# Patient Record
Sex: Male | Born: 1960 | Race: White | Hispanic: No | Smoking: Former smoker
Health system: Southern US, Community
[De-identification: ages and names within clinical notes are randomized; demographics above are authoritative.]

## PROBLEM LIST (undated history)

## (undated) DIAGNOSIS — E785 Hyperlipidemia, unspecified: Secondary | ICD-10-CM

## (undated) DIAGNOSIS — I10 Essential (primary) hypertension: Secondary | ICD-10-CM

## (undated) HISTORY — DX: Hyperlipidemia, unspecified: E78.5

## (undated) HISTORY — DX: Essential (primary) hypertension: I10

## (undated) HISTORY — PX: FRACTURE SURGERY: SHX138

---

## 2012-07-11 ENCOUNTER — Encounter: Payer: 59 | Admitting: Physician Assistant

## 2012-07-11 NOTE — Progress Notes (Signed)
This encounter was created in error - please disregard.

## 2014-10-12 ENCOUNTER — Ambulatory Visit (INDEPENDENT_AMBULATORY_CARE_PROVIDER_SITE_OTHER): Payer: 59 | Admitting: Family Medicine

## 2014-10-12 ENCOUNTER — Telehealth: Payer: Self-pay

## 2014-10-12 VITALS — BP 168/104 | HR 78 | Temp 97.4°F | Resp 16 | Ht 70.0 in | Wt 174.0 lb

## 2014-10-12 DIAGNOSIS — B370 Candidal stomatitis: Secondary | ICD-10-CM

## 2014-10-12 DIAGNOSIS — I10 Essential (primary) hypertension: Secondary | ICD-10-CM

## 2014-10-12 DIAGNOSIS — J029 Acute pharyngitis, unspecified: Secondary | ICD-10-CM

## 2014-10-12 DIAGNOSIS — E785 Hyperlipidemia, unspecified: Secondary | ICD-10-CM

## 2014-10-12 DIAGNOSIS — Z79899 Other long term (current) drug therapy: Secondary | ICD-10-CM

## 2014-10-12 DIAGNOSIS — R351 Nocturia: Secondary | ICD-10-CM

## 2014-10-12 LAB — POCT CBC
Granulocyte percent: 66.3 %G (ref 37–80)
HCT, POC: 48.3 % (ref 43.5–53.7)
Hemoglobin: 16.2 g/dL (ref 14.1–18.1)
Lymph, poc: 1.9 (ref 0.6–3.4)
MCH, POC: 30.4 pg (ref 27–31.2)
MCHC: 33.4 g/dL (ref 31.8–35.4)
MCV: 91.1 fL (ref 80–97)
MID (cbc): 0.6 (ref 0–0.9)
MPV: 8.8 fL (ref 0–99.8)
POC Granulocyte: 5 (ref 2–6.9)
POC LYMPH PERCENT: 25.6 %L (ref 10–50)
POC MID %: 8.1 %M (ref 0–12)
Platelet Count, POC: 197 10*3/uL (ref 142–424)
RBC: 5.31 M/uL (ref 4.69–6.13)
RDW, POC: 13.6 %
WBC: 7.6 10*3/uL (ref 4.6–10.2)

## 2014-10-12 LAB — COMPREHENSIVE METABOLIC PANEL
ALT: 37 U/L (ref 0–53)
AST: 24 U/L (ref 0–37)
Albumin: 4.6 g/dL (ref 3.5–5.2)
Alkaline Phosphatase: 68 U/L (ref 39–117)
BUN: 12 mg/dL (ref 6–23)
CO2: 29 mEq/L (ref 19–32)
Calcium: 9.2 mg/dL (ref 8.4–10.5)
Chloride: 105 mEq/L (ref 96–112)
Creat: 0.99 mg/dL (ref 0.50–1.35)
Glucose, Bld: 101 mg/dL — ABNORMAL HIGH (ref 70–99)
Potassium: 4.2 mEq/L (ref 3.5–5.3)
Sodium: 142 mEq/L (ref 135–145)
Total Bilirubin: 0.5 mg/dL (ref 0.2–1.2)
Total Protein: 7.3 g/dL (ref 6.0–8.3)

## 2014-10-12 LAB — LIPID PANEL
Cholesterol: 223 mg/dL — ABNORMAL HIGH (ref 0–200)
HDL: 45 mg/dL (ref >39)
LDL Cholesterol: 153 mg/dL — ABNORMAL HIGH (ref 0–99)
Total CHOL/HDL Ratio: 5 Ratio
Triglycerides: 123 mg/dL (ref <150)
VLDL: 25 mg/dL (ref 0–40)

## 2014-10-12 LAB — POCT GLYCOSYLATED HEMOGLOBIN (HGB A1C): Hemoglobin A1C: 5.1

## 2014-10-12 LAB — POCT RAPID STREP A (OFFICE): Rapid Strep A Screen: NEGATIVE

## 2014-10-12 LAB — GLUCOSE, POCT (MANUAL RESULT ENTRY): POC Glucose: 86 mg/dl (ref 70–99)

## 2014-10-12 MED ORDER — FLUTICASONE PROPIONATE 50 MCG/ACT NA SUSP: 2.0000 | Freq: Every day | NASAL | Status: DC

## 2014-10-12 MED ORDER — NYSTATIN 100000 UNIT/ML MT SUSP: 5.0000 mL | Freq: Four times a day (QID) | OROMUCOSAL | Status: DC

## 2014-10-12 MED ORDER — CETIRIZINE HCL 10 MG PO TABS: 10.0000 mg | ORAL_TABLET | Freq: Every day | ORAL | Status: DC

## 2014-10-12 MED ORDER — LISINOPRIL-HYDROCHLOROTHIAZIDE 10-12.5 MG PO TABS: 1.0000 | ORAL_TABLET | Freq: Every day | ORAL | Status: DC

## 2014-10-12 NOTE — Telephone Encounter (Signed)
Pt states nystatin could not be filled at pharmacy we sent it to because it is out of stock. Pt wants to know if this med can be sent to [facility] on [address] instead. Pt CB# [phone]

## 2014-10-12 NOTE — Telephone Encounter (Signed)
Med sent. Pt notified.  

## 2014-10-12 NOTE — Patient Instructions (Signed)
Thrush, Adult  Thrush, also called oral candidiasis, is a fungal infection that develops in the mouth and throat and on the tongue. It causes white patches to form on the mouth and tongue. Thrush is most common in older adults, but it can occur at any age.  Many cases of thrush are mild, but this infection can also be more serious. Thrush can be a recurring problem for people who have chronic illnesses or who take medicines that limit the body's ability to fight infection. Because these people have difficulty fighting infections, the fungus that causes thrush can spread throughout the body. This can cause life-threatening blood or organ infections. CAUSES  Thrush is usually caused by a yeast called Candida albicans. This fungus is normally present in small amounts in the mouth and on other mucous membranes. It usually causes no harm. However, when conditions are present that allow the fungus to grow uncontrolled, it invades surrounding tissues and becomes an infection. Less often, other Candida species can also lead to thrush.  RISK FACTORS Thrush is more likely to develop in the following people:  People with an impaired ability to fight infection (weakened immune system).   Older adults.   People with HIV.   People with diabetes.   People with dry mouth (xerostomia).   Pregnant women.   People with poor dental care, especially those who have false teeth.   People who use antibiotic medicines.  SIGNS AND SYMPTOMS  Thrush can be a mild infection that causes no symptoms. If symptoms develop, they may include:   A burning feeling in the mouth and throat. This can occur at the start of a thrush infection.   White patches that adhere to the mouth and tongue. The tissue around the patches may be red, raw, and painful. If rubbed (during tooth brushing, for example), the patches and the tissue of the mouth may bleed easily.   A bad taste in the mouth or difficulty tasting foods.    Cottony feeling in the mouth.   Pain during eating and swallowing. DIAGNOSIS  Your health care provider can usually diagnose thrush by looking in your mouth and asking you questions about your health.  TREATMENT  Medicines that help prevent the growth of fungi (antifungals) are the standard treatment for thrush. These medicines are either applied directly to the affected area (topical) or swallowed (oral). The treatment will depend on the severity of the condition.  Mild Thrush Mild cases of thrush may clear up with the use of an antifungal mouth rinse or lozenges. Treatment usually lasts about 14 days.  Moderate to Severe Thrush  More severe thrush infections that have spread to the esophagus are treated with an oral antifungal medicine. A topical antifungal medicine may also be used.   For some severe infections, a treatment period longer than 14 days may be needed.   Oral antifungal medicines are almost never used during pregnancy because the fetus may be harmed. However, if a pregnant woman has a rare, severe thrush infection that has spread to her blood, oral antifungal medicines may be used. In this case, the risk of harm to the mother and fetus from the severe thrush infection may be greater than the risk posed by the use of antifungal medicines.  Persistent or Recurrent Thrush For cases of thrush that do not go away or keep coming back, treatment may involve the following:   Treatment may be needed twice as long as the symptoms last.   Treatment will   include both oral and topical antifungal medicines.   People with weakened immune systems can take an antifungal medicine on a continuous basis to prevent thrush infections.  It is important to treat conditions that make you more likely to get thrush, such as diabetes or HIV.  HOME CARE INSTRUCTIONS   Only take over-the-counter or prescription medicine as directed by your health care provider. Talk to your health care  provider about an over-the-counter medicine called gentian violet, which kills bacteria and fungi.   Eat plain, unflavored yogurt as directed by your health care provider. Check the label to make sure the yogurt contains live cultures. This yogurt can help healthy bacteria grow in the mouth that can stop the growth of the fungus that causes thrush.   Try these measures to help reduce the discomfort of thrush:   Drink cold liquids such as water or iced tea.   Try flavored ice treats or frozen juices.   Eat foods that are easy to swallow, such as gelatin, ice cream, or custard.   If the patches in your mouth are painful, try drinking from a straw.   Rinse your mouth several times a day with a warm saltwater rinse. You can make the saltwater mixture with 1 tsp (6 g) of salt in 8 fl oz (0.2 L) of warm water.   If you wear dentures, remove the dentures before going to bed, brush them vigorously, and soak them in a cleaning solution as directed by your health care provider.   Women who are breastfeeding should clean their nipples with an antifungal medicine as directed by their health care provider. Dry the nipples after breastfeeding. Applying lanolin-containing body lotion may help relieve nipple soreness.  SEEK MEDICAL CARE IF:  Your symptoms are getting worse or are not improving within 7 days of starting treatment.   You have symptoms of spreading infection, such as white patches on the skin outside of the mouth.   You are nursing and you have redness, burning, or pain in the nipples that is not relieved with treatment.  MAKE SURE YOU:  Understand these instructions.  Will watch your condition.  Will get help right away if you are not doing well or get worse. Document Released: 06/05/2004 Document Revised: 07/01/2013 Document Reviewed: 04/13/2013 [facility] Patient Information 2015 [place], [place]. This information is not intended to replace advice given to you by your  health care provider. Make sure you discuss any questions you have with your health care provider. DASH Eating Plan DASH stands for "Dietary Approaches to Stop Hypertension." The DASH eating plan is a healthy eating plan that has been shown to reduce high blood pressure (hypertension). Additional health benefits may include reducing the risk of type 2 diabetes mellitus, heart disease, and stroke. The DASH eating plan may also help with weight loss. WHAT DO I NEED TO KNOW ABOUT THE DASH EATING PLAN? For the DASH eating plan, you will follow these general guidelines:  Choose foods with a percent daily value for sodium of less than 5% (as listed on the food label).  Use salt-free seasonings or herbs instead of table salt or sea salt.  Check with your health care provider or pharmacist before using salt substitutes.  Eat lower-sodium products, often labeled as "lower sodium" or "no salt added."  Eat fresh foods.  Eat more vegetables, fruits, and low-fat dairy products.  Choose whole grains. Look for the word "whole" as the first word in the ingredient list.  Choose fish and  skinless chicken or [place] more often than red meat. Limit fish, poultry, and meat to 6 oz (170 g) each day.  Limit sweets, desserts, sugars, and sugary drinks.  Choose heart-healthy fats.  Limit cheese to 1 oz (28 g) per day.  Eat more home-cooked food and less restaurant, buffet, and fast food.  Limit fried foods.  Cook foods using methods other than frying.  Limit canned vegetables. If you do use them, rinse them well to decrease the sodium.  When eating at a restaurant, ask that your food be prepared with less salt, or no salt if possible. WHAT FOODS CAN I EAT? Seek help from a dietitian for individual calorie needs. Grains Whole grain or whole wheat bread. Brown rice. Whole grain or whole wheat pasta. Quinoa, bulgur, and whole grain cereals. Low-sodium cereals. Corn or whole wheat flour tortillas. Whole  grain cornbread. Whole grain crackers. Low-sodium crackers. Vegetables Fresh or frozen vegetables (raw, steamed, roasted, or grilled). Low-sodium or reduced-sodium tomato and vegetable juices. Low-sodium or reduced-sodium tomato sauce and paste. Low-sodium or reduced-sodium canned vegetables.  Fruits All fresh, canned (in natural juice), or frozen fruits. Meat and Other Protein Products Ground beef (85% or leaner), grass-fed beef, or beef trimmed of fat. Skinless chicken or [place]. Ground chicken or [place]. Pork trimmed of fat. All fish and seafood. Eggs. Dried beans, peas, or lentils. Unsalted nuts and seeds. Unsalted canned beans. Dairy Low-fat dairy products, such as skim or 1% milk, 2% or reduced-fat cheeses, low-fat ricotta or cottage cheese, or plain low-fat yogurt. Low-sodium or reduced-sodium cheeses. Fats and Oils Tub margarines without trans fats. Light or reduced-fat mayonnaise and salad dressings (reduced sodium). Avocado. Safflower, olive, or canola oils. Natural peanut or almond butter. Other Unsalted popcorn and pretzels. The items listed above may not be a complete list of recommended foods or beverages. Contact your dietitian for more options. WHAT FOODS ARE NOT RECOMMENDED? Grains White bread. White pasta. White rice. Refined cornbread. Bagels and croissants. Crackers that contain trans fat. Vegetables Creamed or fried vegetables. Vegetables in a cheese sauce. Regular canned vegetables. Regular canned tomato sauce and paste. Regular tomato and vegetable juices. Fruits Dried fruits. Canned fruit in light or heavy syrup. Fruit juice. Meat and Other Protein Products Fatty cuts of meat. Ribs, chicken wings, bacon, sausage, bologna, salami, chitterlings, fatback, hot dogs, bratwurst, and packaged luncheon meats. Salted nuts and seeds. Canned beans with salt. Dairy Whole or 2% milk, cream, half-and-half, and cream cheese. Whole-fat or sweetened yogurt. Full-fat cheeses or blue  cheese. Nondairy creamers and whipped toppings. Processed cheese, cheese spreads, or cheese curds. Condiments Onion and garlic salt, seasoned salt, table salt, and sea salt. Canned and packaged gravies. Worcestershire sauce. Tartar sauce. Barbecue sauce. Teriyaki sauce. Soy sauce, including reduced sodium. Steak sauce. Fish sauce. Oyster sauce. Cocktail sauce. Horseradish. Ketchup and mustard. Meat flavorings and tenderizers. Bouillon cubes. Hot sauce. Tabasco sauce. Marinades. Taco seasonings. Relishes. Fats and Oils Butter, stick margarine, lard, shortening, ghee, and bacon fat. Coconut, palm kernel, or palm oils. Regular salad dressings. Other Pickles and olives. Salted popcorn and pretzels. The items listed above may not be a complete list of foods and beverages to avoid. Contact your dietitian for more information. WHERE CAN I FIND MORE INFORMATION? National [employer], [employer], [facility] [employer] Institute: [email or web address] Document Released: 08/30/2011 Document Revised: 01/25/2014 Document Reviewed: 07/15/2013 [facility] Patient Information 2015 [place], [place]. This information is not intended to replace advice given to you by your health care provider. Make sure you discuss any  questions you have with your health care provider. Managing Your High Blood Pressure Blood pressure is a measurement of how forceful your blood is pressing against the walls of the arteries. Arteries are muscular tubes within the circulatory system. Blood pressure does not stay the same. Blood pressure rises when you are active, excited, or nervous; and it lowers during sleep and relaxation. If the numbers measuring your blood pressure stay above normal most of the time, you are at risk for health problems. High blood pressure (hypertension) is a long-term (chronic) condition in which blood pressure is elevated. A blood pressure reading is recorded as two numbers, such as 120 over 80 (or 120/80). The  first, higher number is called the systolic pressure. It is a measure of the pressure in your arteries as the heart beats. The second, lower number is called the diastolic pressure. It is a measure of the pressure in your arteries as the heart relaxes between beats.  Keeping your blood pressure in a normal range is important to your overall health and prevention of health problems, such as heart disease and stroke. When your blood pressure is uncontrolled, your heart has to work harder than normal. High blood pressure is a very common condition in adults because blood pressure tends to rise with age. Men and women are equally likely to have hypertension but at different times in life. Before age 80, men are more likely to have hypertension. After 55 years of age, women are more likely to have it. Hypertension is especially common in African Americans. This condition often has no signs or symptoms. The cause of the condition is usually not known. Your caregiver can help you come up with a plan to keep your blood pressure in a normal, healthy range. BLOOD PRESSURE STAGES Blood pressure is classified into four stages: normal, prehypertension, stage 1, and stage 2. Your blood pressure reading will be used to determine what type of treatment, if any, is necessary. Appropriate treatment options are tied to these four stages:  Normal  Systolic pressure (mm Hg): below 120.  Diastolic pressure (mm Hg): below 80. Prehypertension  Systolic pressure (mm Hg): 120 to 139.  Diastolic pressure (mm Hg): 80 to 89. Stage1  Systolic pressure (mm Hg): 140 to 159.  Diastolic pressure (mm Hg): 90 to 99. Stage2  Systolic pressure (mm Hg): 160 or above.  Diastolic pressure (mm Hg): 100 or above. RISKS RELATED TO HIGH BLOOD PRESSURE Managing your blood pressure is an important responsibility. Uncontrolled high blood pressure can lead to:  A heart attack.  A stroke.  A weakened blood vessel  (aneurysm).  Heart failure.  Kidney damage.  Eye damage.  Metabolic syndrome.  Memory and concentration problems. HOW TO MANAGE YOUR BLOOD PRESSURE Blood pressure can be managed effectively with lifestyle changes and medicines (if needed). Your caregiver will help you come up with a plan to bring your blood pressure within a normal range. Your plan should include the following: Education  Read all information provided by your caregivers about how to control blood pressure.  Educate yourself on the latest guidelines and treatment recommendations. New research is always being done to further define the risks and treatments for high blood pressure. Lifestylechanges  Control your weight.  Avoid smoking.  Stay physically active.  Reduce the amount of salt in your diet.  Reduce stress.  Control any chronic conditions, such as high cholesterol or diabetes.  Reduce your alcohol intake. Medicines  Several medicines (antihypertensive medicines) are available, if  needed, to bring blood pressure within a normal range. Communication  Review all the medicines you take with your caregiver because there may be side effects or interactions.  Talk with your caregiver about your diet, exercise habits, and other lifestyle factors that may be contributing to high blood pressure.  See your caregiver regularly. Your caregiver can help you create and adjust your plan for managing high blood pressure. RECOMMENDATIONS FOR TREATMENT AND FOLLOW-UP  The following recommendations are based on current guidelines for managing high blood pressure in nonpregnant adults. Use these recommendations to identify the proper follow-up period or treatment option based on your blood pressure reading. You can discuss these options with your caregiver.  Systolic pressure of 120 to 139 or diastolic pressure of 80 to 89: Follow up with your caregiver as directed.  Systolic pressure of 140 to 160 or diastolic  pressure of 90 to 100: Follow up with your caregiver within 2 months.  Systolic pressure above 160 or diastolic pressure above 100: Follow up with your caregiver within 1 month.  Systolic pressure above 180 or diastolic pressure above 110: Consider antihypertensive therapy; follow up with your caregiver within 1 week.  Systolic pressure above 200 or diastolic pressure above 120: Begin antihypertensive therapy; follow up with your caregiver within 1 week. Document Released: 06/04/2012 Document Reviewed: 06/04/2012 [facility] Patient Information 2015 [place]. This information is not intended to replace advice given to you by your health care provider. Make sure you discuss any questions you have with your health care provider. Hypertension Hypertension, commonly called high blood pressure, is when the force of blood pumping through your arteries is too strong. Your arteries are the blood vessels that carry blood from your heart throughout your body. A blood pressure reading consists of a higher number over a lower number, such as 110/72. The higher number (systolic) is the pressure inside your arteries when your heart pumps. The lower number (diastolic) is the pressure inside your arteries when your heart relaxes. Ideally you want your blood pressure below 120/80. Hypertension forces your heart to work harder to pump blood. Your arteries may become narrow or stiff. Having hypertension puts you at risk for heart disease, stroke, and other problems.  RISK FACTORS Some risk factors for high blood pressure are controllable. Others are not.  Risk factors you cannot control include:   Race. You may be at higher risk if you are African American.  Age. Risk increases with age.  Gender. Men are at higher risk than women before age 55 years. After age 265, women are at higher risk than men. Risk factors you can control include:  Not getting enough exercise or physical activity.  Being  overweight.  Getting too much fat, sugar, calories, or salt in your diet.  Drinking too much alcohol. SIGNS AND SYMPTOMS Hypertension does not usually cause signs or symptoms. Extremely high blood pressure (hypertensive crisis) may cause headache, anxiety, shortness of breath, and nosebleed. DIAGNOSIS  To check if you have hypertension, your health care provider will measure your blood pressure while you are seated, with your arm held at the level of your heart. It should be measured at least twice using the same arm. Certain conditions can cause a difference in blood pressure between your right and left arms. A blood pressure reading that is higher than normal on one occasion does not mean that you need treatment. If one blood pressure reading is high, ask your health care provider about having it checked again. TREATMENT  Treating high blood pressure includes making lifestyle changes and possibly taking medicine. Living a healthy lifestyle can help lower high blood pressure. You may need to change some of your habits. Lifestyle changes may include:  Following the DASH diet. This diet is high in fruits, vegetables, and whole grains. It is low in salt, red meat, and added sugars.  Getting at least 2 hours of brisk physical activity every week.  Losing weight if necessary.  Not smoking.  Limiting alcoholic beverages.  Learning ways to reduce stress. If lifestyle changes are not enough to get your blood pressure under control, your health care provider may prescribe medicine. You may need to take more than one. Work closely with your health care provider to understand the risks and benefits. HOME CARE INSTRUCTIONS  Have your blood pressure rechecked as directed by your health care provider.   Take medicines only as directed by your health care provider. Follow the directions carefully. Blood pressure medicines must be taken as prescribed. The medicine does not work as well when you skip  doses. Skipping doses also puts you at risk for problems.   Do not smoke.   Monitor your blood pressure at home as directed by your health care provider. SEEK MEDICAL CARE IF:   You think you are having a reaction to medicines taken.  You have recurrent headaches or feel dizzy.  You have swelling in your ankles.  You have trouble with your vision. SEEK IMMEDIATE MEDICAL CARE IF:  You develop a severe headache or confusion.  You have unusual weakness, numbness, or feel faint.  You have severe chest or abdominal pain.  You vomit repeatedly.  You have trouble breathing. MAKE SURE YOU:   Understand these instructions.  Will watch your condition.  Will get help right away if you are not doing well or get worse. Document Released: 09/10/2005 Document Revised: 01/25/2014 Document Reviewed: 07/03/2013 [facility] Patient Information 2015 [place], [place]. This information is not intended to replace advice given to you by your health care provider. Make sure you discuss any questions you have with your health care provider.

## 2014-10-12 NOTE — Progress Notes (Addendum)
Subjective:    Patient ID: Scott Pruitt, male    DOB: 10-03-1960, 54 y.o.   MRN: [id number] This chart was scribed for Norberto Sorenson, MD by Littie Deeds, Medical Scribe. This patient was seen in Room 1 and the patient's care was started at 9:37 AM.   Chief Complaint  Patient presents with  . Sore Throat    x 2 weeks   . Sinusitis    HPI HPI Comments: Scott Pruitt is a 54 y.o. male who presents to the [facility] complaining of gradual onset, intermittent sore throat that started 2 weeks. Patient also reports having postnasal drip. He has a deviated septum to the left since he was 54 years old which was never fixed. He denies burning and ulcers in his mouth, weight changes, urinary symptoms, fever, chills, and diaphoresis. Patient also denies using mouthwashes or inhalers, smoking, athlete's foot, hx of mono in the past. His last STD testing was years ago, but patient states he is not currently sexually active. He states he has been eating, drinking, and sleeping fine, and he states he has been tasting normally. Patient does say he gets cold sores sometimes. He has a PSHx of tonsillectomy. Patient has not had a physical done in a few years.  Patient does note that he had nocturia a few times a night a few months ago. He states he has been taking a prostate supplement with relief to his nocturia.  History reviewed. No pertinent past medical history. No current outpatient prescriptions on file prior to visit.   No current facility-administered medications on file prior to visit.   No Known Allergies   Review of Systems  Constitutional: Negative for fever, chills, diaphoresis, appetite change and unexpected weight change.  HENT: Positive for postnasal drip and sore throat.   Genitourinary: Negative for dysuria, hematuria, enuresis and difficulty urinating.       Objective:  BP 168/104 mmHg  Pulse 78  Temp(Src) 97.4 F (36.3 C) (Oral)  Resp 16  Ht  (1.778 m)   Wt 174 lb (78.926 kg)  BMI 24.97 kg/m2  SpO2 99%  Physical Exam  Constitutional: He is oriented to person, place, and time. He appears well-developed and well-nourished. No distress.  HENT:  Head: Normocephalic and atraumatic.  Right Ear: Tympanic membrane normal.  Left Ear: Tympanic membrane normal.  Nose: Mucosal edema, rhinorrhea and septal deviation present.  Mouth/Throat: Oropharynx is clear and moist. No oropharyngeal exudate.  Septum deviated to the left. Throat erythema extending up over the soft palate with small pinpoint vesicle midline. Significant amount of postnasal drip.  Eyes: Pupils are equal, round, and reactive to light.  Neck: Neck supple. No thyromegaly present.  Anterior cervical adenopathy. Thyroid normal.  Cardiovascular: Normal rate, regular rhythm, S1 normal, S2 normal and normal heart sounds.   No murmur heard. Pulmonary/Chest: Effort normal and breath sounds normal. No respiratory distress. He has no wheezes.  CTAB.  Musculoskeletal: He exhibits no edema.  Lymphadenopathy:       Head (right side): No submandibular adenopathy present.       Head (left side): No submandibular adenopathy present.    He has cervical adenopathy.       Right: No supraclavicular adenopathy present.       Left: No supraclavicular adenopathy present.  Neurological: He is alert and oriented to person, place, and time. No cranial nerve deficit.  Skin: Skin is warm and dry. No rash noted.  Psychiatric: He has a  normal mood and affect. His behavior is normal.  Vitals reviewed.         Assessment & Plan:   Sore throat - Plan: POCT rapid strep A, POCT CBC, Culture, Group A Strep - suspect due to thrush so start nystatin  Essential hypertension, benign - Plan: Lipid panel - BP quite elevated today so will start lisinopril-hctz 10-12.5 since pt reports he has had similarly elevated BP outside office - rec pt purchasing home BP cuff.  Encounter for long-term (current) use of  medications - Plan: Lipid panel, Comprehensive metabolic panel  Thrush, oral - Plan: POCT glucose (manual entry), POCT glycosylated hemoglobin (Hb A1C) - unsure while pt has developed this as he denies any other fungal infections, is not immunosuppressed that we are aware of - not on any meds - start nystatin swish and swallow.  Rec pt make CPE asap to complete cancer screening.  Nocturia - Plan: POCT glucose (manual entry), POCT glycosylated hemoglobin (Hb A1C)  HPL - flp today give pt ASCVD risk of 10.1% with ideal risk for 3.2% so start statin.  Meds ordered this encounter  Medications  . DISCONTD: nystatin (MYCOSTATIN) 100000 UNIT/ML suspension    Sig: Take 5 mLs (500,000 Units total) by mouth 4 (four) times daily.    Dispense:  300 mL    Refill:  0  . fluticasone (FLONASE) 50 MCG/ACT nasal spray    Sig: Place 2 sprays into both nostrils at bedtime.    Dispense:  16 g    Refill:  2  . cetirizine (ZYRTEC) 10 MG tablet    Sig: Take 1 tablet (10 mg total) by mouth at bedtime.    Dispense:  30 tablet    Refill:  2  . lisinopril-hydrochlorothiazide (PRINZIDE,ZESTORETIC) 10-12.5 MG per tablet    Sig: Take 1 tablet by mouth daily.    Dispense:  30 tablet    Refill:  1  . DISCONTD: nystatin (MYCOSTATIN) 100000 UNIT/ML suspension    Sig: Take 5 mLs (500,000 Units total) by mouth 4 (four) times daily.    Dispense:  300 mL    Refill:  0    I personally performed the services described in this documentation, which was scribed in my presence. The recorded information has been reviewed and considered, and addended by me as needed.  Norberto SorensonEva Tacori Kvamme, MD MPH  Results for orders placed or performed in visit on 10/12/14  Lipid panel  Result Value Ref Range   Cholesterol 223 (H) 0 - 200 mg/dL   Triglycerides 629123 <528<150 mg/dL   HDL 45 >41>39 mg/dL   Total CHOL/HDL Ratio 5.0 Ratio   VLDL 25 0 - 40 mg/dL   LDL Cholesterol 324153 (H) 0 - 99 mg/dL  Comprehensive metabolic panel  Result Value Ref Range    Sodium 142 135 - 145 mEq/L   Potassium 4.2 3.5 - 5.3 mEq/L   Chloride 105 96 - 112 mEq/L   CO2 29 19 - 32 mEq/L   Glucose, Bld 101 (H) 70 - 99 mg/dL   BUN 12 6 - 23 mg/dL   Creat 4.010.99 0.270.50 - 2.531.35 mg/dL   Total Bilirubin 0.5 0.2 - 1.2 mg/dL   Alkaline Phosphatase 68 39 - 117 U/L   AST 24 0 - 37 U/L   ALT 37 0 - 53 U/L   Total Protein 7.3 6.0 - 8.3 g/dL   Albumin 4.6 3.5 - 5.2 g/dL   Calcium 9.2 8.4 - 66.410.5 mg/dL  POCT rapid strep A  Result Value Ref Range   Rapid Strep A Screen Negative Negative  POCT CBC  Result Value Ref Range   WBC 7.6 4.6 - 10.2 K/uL   Lymph, poc 1.9 0.6 - 3.4   POC LYMPH PERCENT 25.6 10 - 50 %L   MID (cbc) 0.6 0 - 0.9   POC MID % 8.1 0 - 12 %M   POC Granulocyte 5.0 2 - 6.9   Granulocyte percent 66.3 37 - 80 %G   RBC 5.31 4.69 - 6.13 M/uL   Hemoglobin 16.2 14.1 - 18.1 g/dL   HCT, POC 11.9 14.7 - 53.7 %   MCV 91.1 80 - 97 fL   MCH, POC 30.4 27 - 31.2 pg   MCHC 33.4 31.8 - 35.4 g/dL   RDW, POC 82.9 %   Platelet Count, POC 197 142 - 424 K/uL   MPV 8.8 0 - 99.8 fL  POCT glucose (manual entry)  Result Value Ref Range   POC Glucose 86 70 - 99 mg/dl  POCT glycosylated hemoglobin (Hb A1C)  Result Value Ref Range   Hemoglobin A1C 5.1

## 2014-10-13 ENCOUNTER — Encounter: Payer: Self-pay | Admitting: Family Medicine

## 2014-10-13 MED ORDER — PRAVASTATIN SODIUM 40 MG PO TABS: 40.0000 mg | ORAL_TABLET | Freq: Every day | ORAL | Status: DC

## 2014-10-14 ENCOUNTER — Telehealth: Payer: Self-pay

## 2014-10-14 LAB — CULTURE, GROUP A STREP: Organism ID, Bacteria: NORMAL

## 2014-10-14 NOTE — Telephone Encounter (Signed)
1) Patient is returning our call for lab results 2) states he has had a problem getting a prescribed mouthwash filled and wants to discuss that concern and now the necessity of the rx

## 2014-10-17 NOTE — Telephone Encounter (Signed)
LM for rtn call- normal/negative lab results.

## 2014-10-22 ENCOUNTER — Other Ambulatory Visit: Payer: Self-pay

## 2014-10-22 MED ORDER — NYSTATIN 100000 UNIT/ML MT SUSP: 5.0000 mL | Freq: Four times a day (QID) | OROMUCOSAL | Status: DC

## 2014-10-22 NOTE — Telephone Encounter (Signed)
There was a transmission error when sent before. Resending now.

## 2014-10-25 NOTE — Progress Notes (Signed)
Appointment scheduled for 12/03/14 @ 2:15 patient notified.

## 2014-11-12 ENCOUNTER — Ambulatory Visit: Payer: 59 | Admitting: Family Medicine

## 2014-12-03 ENCOUNTER — Ambulatory Visit (INDEPENDENT_AMBULATORY_CARE_PROVIDER_SITE_OTHER): Payer: 59 | Admitting: Family Medicine

## 2014-12-03 ENCOUNTER — Encounter: Payer: Self-pay | Admitting: Family Medicine

## 2014-12-03 VITALS — BP 136/86 | HR 80 | Temp 97.9°F | Resp 16 | Ht 70.0 in | Wt 164.0 lb

## 2014-12-03 DIAGNOSIS — I10 Essential (primary) hypertension: Secondary | ICD-10-CM | POA: Diagnosis not present

## 2014-12-03 DIAGNOSIS — Z0000 Encounter for general adult medical examination without abnormal findings: Secondary | ICD-10-CM | POA: Diagnosis not present

## 2014-12-03 DIAGNOSIS — Z79899 Other long term (current) drug therapy: Secondary | ICD-10-CM | POA: Diagnosis not present

## 2014-12-03 DIAGNOSIS — E785 Hyperlipidemia, unspecified: Secondary | ICD-10-CM

## 2014-12-03 DIAGNOSIS — Z125 Encounter for screening for malignant neoplasm of prostate: Secondary | ICD-10-CM

## 2014-12-03 DIAGNOSIS — Z23 Encounter for immunization: Secondary | ICD-10-CM | POA: Diagnosis not present

## 2014-12-03 LAB — POCT UA - MICROSCOPIC ONLY
Bacteria, U Microscopic: NEGATIVE
Casts, Ur, LPF, POC: NEGATIVE
Crystals, Ur, HPF, POC: NEGATIVE
Mucus, UA: NEGATIVE
RBC, urine, microscopic: NEGATIVE
WBC, Ur, HPF, POC: NEGATIVE
Yeast, UA: NEGATIVE

## 2014-12-03 LAB — POCT URINALYSIS DIPSTICK
Bilirubin, UA: NEGATIVE
Blood, UA: NEGATIVE
Glucose, UA: NEGATIVE
Ketones, UA: NEGATIVE
Leukocytes, UA: NEGATIVE
Nitrite, UA: NEGATIVE
Protein, UA: NEGATIVE
Spec Grav, UA: 1.015
Urobilinogen, UA: 0.2
pH, UA: 5

## 2014-12-03 LAB — COMPREHENSIVE METABOLIC PANEL
ALT: 27 U/L (ref 0–53)
AST: 21 U/L (ref 0–37)
Albumin: 4.6 g/dL (ref 3.5–5.2)
Alkaline Phosphatase: 58 U/L (ref 39–117)
BUN: 15 mg/dL (ref 6–23)
CO2: 24 mEq/L (ref 19–32)
Calcium: 9.5 mg/dL (ref 8.4–10.5)
Chloride: 102 mEq/L (ref 96–112)
Creat: 0.92 mg/dL (ref 0.50–1.35)
Glucose, Bld: 87 mg/dL (ref 70–99)
Potassium: 3.9 mEq/L (ref 3.5–5.3)
Sodium: 140 mEq/L (ref 135–145)
Total Bilirubin: 0.5 mg/dL (ref 0.2–1.2)
Total Protein: 7.3 g/dL (ref 6.0–8.3)

## 2014-12-03 NOTE — Patient Instructions (Signed)
Fat and Cholesterol Control Diet Fat and cholesterol levels in your blood and organs are influenced by your diet. High levels of fat and cholesterol may lead to diseases of the heart, small and large blood vessels, gallbladder, liver, and pancreas. CONTROLLING FAT AND CHOLESTEROL WITH DIET Although exercise and lifestyle factors are important, your diet is key. That is because certain foods are known to raise cholesterol and others to lower it. The goal is to balance foods for their effect on cholesterol and more importantly, to replace saturated and trans fat with other types of fat, such as monounsaturated fat, polyunsaturated fat, and omega-3 fatty acids. On average, a person should consume no more than 15 to 17 g of saturated fat daily. Saturated and trans fats are considered "bad" fats, and they will raise LDL cholesterol. Saturated fats are primarily found in animal products such as meats, butter, and cream. However, that does not mean you need to give up all your favorite foods. Today, there are good tasting, low-fat, low-cholesterol substitutes for most of the things you like to eat. Choose low-fat or nonfat alternatives. Choose round or loin cuts of red meat. These types of cuts are lowest in fat and cholesterol. Chicken (without the skin), fish, veal, and ground turkey breast are great choices. Eliminate fatty meats, such as hot dogs and salami. Even shellfish have little or no saturated fat. Have a 3 oz (85 g) portion when you eat lean meat, poultry, or fish. Trans fats are also called "partially hydrogenated oils." They are oils that have been scientifically manipulated so that they are solid at room temperature resulting in a longer shelf life and improved taste and texture of foods in which they are added. Trans fats are found in stick margarine, some tub margarines, cookies, crackers, and baked goods.  When baking and cooking, oils are a great substitute for butter. The monounsaturated oils are  especially beneficial since it is believed they lower LDL and raise HDL. The oils you should avoid entirely are saturated tropical oils, such as coconut and palm.  Remember to eat a lot from food groups that are naturally free of saturated and trans fat, including fish, fruit, vegetables, beans, grains (barley, rice, couscous, bulgur wheat), and pasta (without cream sauces).  IDENTIFYING FOODS THAT LOWER FAT AND CHOLESTEROL  Soluble fiber may lower your cholesterol. This type of fiber is found in fruits such as apples, vegetables such as broccoli, potatoes, and carrots, legumes such as beans, peas, and lentils, and grains such as barley. Foods fortified with plant sterols (phytosterol) may also lower cholesterol. You should eat at least 2 g per day of these foods for a cholesterol lowering effect.  Read package labels to identify low-saturated fats, trans fat free, and low-fat foods at the supermarket. Select cheeses that have only 2 to 3 g saturated fat per ounce. Use a heart-healthy tub margarine that is free of trans fats or partially hydrogenated oil. When buying baked goods (cookies, crackers), avoid partially hydrogenated oils. Breads and muffins should be made from whole grains (whole-wheat or whole oat flour, instead of "flour" or "enriched flour"). Buy non-creamy canned soups with reduced salt and no added fats.  FOOD PREPARATION TECHNIQUES  Never deep-fry. If you must fry, either stir-fry, which uses very little fat, or use non-stick cooking sprays. When possible, broil, bake, or roast meats, and steam vegetables. Instead of putting butter or margarine on vegetables, use lemon and herbs, applesauce, and cinnamon (for squash and sweet potatoes). Use nonfat   yogurt, salsa, and low-fat dressings for salads.  LOW-SATURATED FAT / LOW-FAT FOOD SUBSTITUTES Meats / Saturated Fat (g)  Avoid: Steak, marbled (3 oz/85 g) / 11 g  Choose: Steak, lean (3 oz/85 g) / 4 g  Avoid: Hamburger (3 oz/85 g) / 7  g  Choose: Hamburger, lean (3 oz/85 g) / 5 g  Avoid: Ham (3 oz/85 g) / 6 g  Choose: Ham, lean cut (3 oz/85 g) / 2.4 g  Avoid: Chicken, with skin, dark meat (3 oz/85 g) / 4 g  Choose: Chicken, skin removed, dark meat (3 oz/85 g) / 2 g  Avoid: Chicken, with skin, light meat (3 oz/85 g) / 2.5 g  Choose: Chicken, skin removed, light meat (3 oz/85 g) / 1 g Dairy / Saturated Fat (g)  Avoid: Whole milk (1 cup) / 5 g  Choose: Low-fat milk, 2% (1 cup) / 3 g  Choose: Low-fat milk, 1% (1 cup) / 1.5 g  Choose: Skim milk (1 cup) / 0.3 g  Avoid: Hard cheese (1 oz/28 g) / 6 g  Choose: Skim milk cheese (1 oz/28 g) / 2 to 3 g  Avoid: Cottage cheese, 4% fat (1 cup) / 6.5 g  Choose: Low-fat cottage cheese, 1% fat (1 cup) / 1.5 g  Avoid: Ice cream (1 cup) / 9 g  Choose: Sherbet (1 cup) / 2.5 g  Choose: Nonfat frozen yogurt (1 cup) / 0.3 g  Choose: Frozen fruit bar / trace  Avoid: Whipped cream (1 tbs) / 3.5 g  Choose: Nondairy whipped topping (1 tbs) / 1 g Condiments / Saturated Fat (g)  Avoid: Mayonnaise (1 tbs) / 2 g  Choose: Low-fat mayonnaise (1 tbs) / 1 g  Avoid: Butter (1 tbs) / 7 g  Choose: Extra light margarine (1 tbs) / 1 g  Avoid: Coconut oil (1 tbs) / 11.8 g  Choose: Olive oil (1 tbs) / 1.8 g  Choose: Corn oil (1 tbs) / 1.7 g  Choose: Safflower oil (1 tbs) / 1.2 g  Choose: Sunflower oil (1 tbs) / 1.4 g  Choose: Soybean oil (1 tbs) / 2.4 g  Choose: Canola oil (1 tbs) / 1 g Document Released: 09/10/2005 Document Revised: 01/05/2013 Document Reviewed: 12/09/2013 [employer] Patient Information 2015 [employer]. This information is not intended to replace advice given to you by your health care provider. Make sure you discuss any questions you have with your health care provider. DASH Eating Plan DASH stands for "Dietary Approaches to Stop Hypertension." The DASH eating plan is a healthy eating plan that has been shown to reduce high blood pressure  (hypertension). Additional health benefits may include reducing the risk of type 2 diabetes mellitus, heart disease, and stroke. The DASH eating plan may also help with weight loss. WHAT DO I NEED TO KNOW ABOUT THE DASH EATING PLAN? For the DASH eating plan, you will follow these general guidelines:  Choose foods with a percent daily value for sodium of less than 5% (as listed on the food label).  Use salt-free seasonings or herbs instead of table salt or sea salt.  Check with your health care provider or pharmacist before using salt substitutes.  Eat lower-sodium products, often labeled as "lower sodium" or "no salt added."  Eat fresh foods.  Eat more vegetables, fruits, and low-fat dairy products.  Choose whole grains. Look for the word "whole" as the first word in the ingredient list.  Choose fish and skinless chicken or turkey more often than   red meat. Limit fish, poultry, and meat to 6 oz (170 g) each day.  Limit sweets, desserts, sugars, and sugary drinks.  Choose heart-healthy fats.  Limit cheese to 1 oz (28 g) per day.  Eat more home-cooked food and less restaurant, buffet, and fast food.  Limit fried foods.  Cook foods using methods other than frying.  Limit canned vegetables. If you do use them, rinse them well to decrease the sodium.  When eating at a restaurant, ask that your food be prepared with less salt, or no salt if possible. WHAT FOODS CAN I EAT? Seek help from a dietitian for individual calorie needs. Grains Whole grain or whole wheat bread. Brown rice. Whole grain or whole wheat pasta. Quinoa, bulgur, and whole grain cereals. Low-sodium cereals. Corn or whole wheat flour tortillas. Whole grain cornbread. Whole grain crackers. Low-sodium crackers. Vegetables Fresh or frozen vegetables (raw, steamed, roasted, or grilled). Low-sodium or reduced-sodium tomato and vegetable juices. Low-sodium or reduced-sodium tomato sauce and paste. Low-sodium or  reduced-sodium canned vegetables.  Fruits All fresh, canned (in natural juice), or frozen fruits. Meat and Other Protein Products Ground beef (85% or leaner), grass-fed beef, or beef trimmed of fat. Skinless chicken or turkey. Ground chicken or turkey. Pork trimmed of fat. All fish and seafood. Eggs. Dried beans, peas, or lentils. Unsalted nuts and seeds. Unsalted canned beans. Dairy Low-fat dairy products, such as skim or 1% milk, 2% or reduced-fat cheeses, low-fat ricotta or cottage cheese, or plain low-fat yogurt. Low-sodium or reduced-sodium cheeses. Fats and Oils Tub margarines without trans fats. Light or reduced-fat mayonnaise and salad dressings (reduced sodium). Avocado. Safflower, olive, or canola oils. Natural peanut or almond butter. Other Unsalted popcorn and pretzels. The items listed above may not be a complete list of recommended foods or beverages. Contact your dietitian for more options. WHAT FOODS ARE NOT RECOMMENDED? Grains White bread. White pasta. White rice. Refined cornbread. Bagels and croissants. Crackers that contain trans fat. Vegetables Creamed or fried vegetables. Vegetables in a cheese sauce. Regular canned vegetables. Regular canned tomato sauce and paste. Regular tomato and vegetable juices. Fruits Dried fruits. Canned fruit in light or heavy syrup. Fruit juice. Meat and Other Protein Products Fatty cuts of meat. Ribs, chicken wings, bacon, sausage, bologna, salami, chitterlings, fatback, hot dogs, bratwurst, and packaged luncheon meats. Salted nuts and seeds. Canned beans with salt. Dairy Whole or 2% milk, cream, half-and-half, and cream cheese. Whole-fat or sweetened yogurt. Full-fat cheeses or blue cheese. Nondairy creamers and whipped toppings. Processed cheese, cheese spreads, or cheese curds. Condiments Onion and garlic salt, seasoned salt, table salt, and sea salt. Canned and packaged gravies. Worcestershire sauce. Tartar sauce. Barbecue sauce. Teriyaki  sauce. Soy sauce, including reduced sodium. Steak sauce. Fish sauce. Oyster sauce. Cocktail sauce. Horseradish. Ketchup and mustard. Meat flavorings and tenderizers. Bouillon cubes. Hot sauce. Tabasco sauce. Marinades. Taco seasonings. Relishes. Fats and Oils Butter, stick margarine, lard, shortening, ghee, and bacon fat. Coconut, palm kernel, or palm oils. Regular salad dressings. Other Pickles and olives. Salted popcorn and pretzels. The items listed above may not be a complete list of foods and beverages to avoid. Contact your dietitian for more information. WHERE CAN I FIND MORE INFORMATION? National [employer], [employer], [employer]: www.nhlbi.nih.gov/health/health-topics/topics/dash/ Document Released: 08/30/2011 Document Revised: 01/25/2014 Document Reviewed: 07/15/2013 [employer] Patient Information 2015 [employer]. This information is not intended to replace advice given to you by your health care provider. Make sure you discuss any questions you have with your health care   provider. Health Maintenance A healthy lifestyle and preventative care can promote health and wellness.  Maintain regular health, dental, and eye exams.  Eat a healthy diet. Foods like vegetables, fruits, whole grains, low-fat dairy products, and lean protein foods contain the nutrients you need and are low in calories. Decrease your intake of foods high in solid fats, added sugars, and salt. Get information about a proper diet from your health care provider, if necessary.  Regular physical exercise is one of the most important things you can do for your health. Most adults should get at least 150 minutes of moderate-intensity exercise (any activity that increases your heart rate and causes you to sweat) each week. In addition, most adults need muscle-strengthening exercises on 2 or more days a week.   Maintain a healthy weight. The body mass index (BMI) is a screening tool to identify possible weight problems. It  provides an estimate of body fat based on height and weight. Your health care provider can find your BMI and can help you achieve or maintain a healthy weight. For males 20 years and older:  A BMI below 18.5 is considered underweight.  A BMI of 18.5 to 24.9 is normal.  A BMI of 25 to 29.9 is considered overweight.  A BMI of 30 and above is considered obese.  Maintain normal blood lipids and cholesterol by exercising and minimizing your intake of saturated fat. Eat a balanced diet with plenty of fruits and vegetables. Blood tests for lipids and cholesterol should begin at age 55 and be repeated every 5 years. If your lipid or cholesterol levels are high, you are over age 750, or you are at high risk for heart disease, you may need your cholesterol levels checked more frequently.Ongoing high lipid and cholesterol levels should be treated with medicines if diet and exercise are not working.  If you smoke, find out from your health care provider how to quit. If you do not use tobacco, do not start.  Lung cancer screening is recommended for adults aged 55-80 years who are at high risk for developing lung cancer because of a history of smoking. A yearly low-dose CT scan of the lungs is recommended for people who have at least a 30-pack-year history of smoking and are current smokers or have quit within the past 15 years. A pack year of smoking is smoking an average of 1 pack of cigarettes a day for 1 year (for example, a 30-pack-year history of smoking could mean smoking 1 pack a day for 30 years or 2 packs a day for 15 years). Yearly screening should continue until the smoker has stopped smoking for at least 15 years. Yearly screening should be stopped for people who develop a health problem that would prevent them from having lung cancer treatment.  If you choose to drink alcohol, do not have more than 2 drinks per day. One drink is considered to be 12 oz (360 mL) of beer, 5 oz (150 mL) of wine, or 1.5  oz (45 mL) of liquor.  Avoid the use of street drugs. Do not share needles with anyone. Ask for help if you need support or instructions about stopping the use of drugs.  High blood pressure causes heart disease and increases the risk of stroke. Blood pressure should be checked at least every 1-2 years. Ongoing high blood pressure should be treated with medicines if weight loss and exercise are not effective.  If you are 4445-264 years old, ask your health care  provider if you should take aspirin to prevent heart disease.  Diabetes screening involves taking a blood sample to check your fasting blood sugar level. This should be done once every 3 years after age 99 if you are at a normal weight and without risk factors for diabetes. Testing should be considered at a younger age or be carried out more frequently if you are overweight and have at least 1 risk factor for diabetes.  Colorectal cancer can be detected and often prevented. Most routine colorectal cancer screening begins at the age of 30 and continues through age 20. However, your health care provider may recommend screening at an earlier age if you have risk factors for colon cancer. On a yearly basis, your health care provider may provide home test kits to check for hidden blood in the stool. A small camera at the end of a tube may be used to directly examine the colon (sigmoidoscopy or colonoscopy) to detect the earliest forms of colorectal cancer. Talk to your health care provider about this at age 6 when routine screening begins. A direct exam of the colon should be repeated every 5-10 years through age 41, unless early forms of precancerous polyps or small growths are found.  People who are at an increased risk for hepatitis B should be screened for this virus. You are considered at high risk for hepatitis B if:  You were born in a country where hepatitis B occurs often. Talk with your health care provider about which countries are  considered high risk.  Your parents were born in a high-risk country and you have not received a shot to protect against hepatitis B (hepatitis B vaccine).  You have HIV or AIDS.  You use needles to inject street drugs.  You live with, or have sex with, someone who has hepatitis B.  You are a man who has sex with other men (MSM).  You get hemodialysis treatment.  You take certain medicines for conditions like cancer, organ transplantation, and autoimmune conditions.  Hepatitis C blood testing is recommended for all people born from [birth year] through 1966 and any individual with known risk factors for hepatitis C.  Healthy men should no longer receive prostate-specific antigen (PSA) blood tests as part of routine cancer screening. Talk to your health care provider about prostate cancer screening.  Testicular cancer screening is not recommended for adolescents or adult males who have no symptoms. Screening includes self-exam, a health care provider exam, and other screening tests. Consult with your health care provider about any symptoms you have or any concerns you have about testicular cancer.  Practice safe sex. Use condoms and avoid high-risk sexual practices to reduce the spread of sexually transmitted infections (STIs).  You should be screened for STIs, including gonorrhea and chlamydia if:  You are sexually active and are younger than 24 years.  You are older than 24 years, and your health care provider tells you that you are at risk for this type of infection.  Your sexual activity has changed since you were last screened, and you are at an increased risk for chlamydia or gonorrhea. Ask your health care provider if you are at risk.  If you are at risk of being infected with HIV, it is recommended that you take a prescription medicine daily to prevent HIV infection. This is called pre-exposure prophylaxis (PrEP). You are considered at risk if:  You are a man who has sex with other  men (MSM).  You are a heterosexual  man who is sexually active with multiple partners.  You take drugs by injection.  You are sexually active with a partner who has HIV.  Talk with your health care provider about whether you are at high risk of being infected with HIV. If you choose to begin PrEP, you should first be tested for HIV. You should then be tested every 3 months for as long as you are taking PrEP.  Use sunscreen. Apply sunscreen liberally and repeatedly throughout the day. You should seek shade when your shadow is shorter than you. Protect yourself by wearing long sleeves, pants, a wide-brimmed hat, and sunglasses year round whenever you are outdoors.  Tell your health care provider of new moles or changes in moles, especially if there is a change in shape or color. Also, tell your health care provider if a mole is larger than the size of a pencil eraser.  A one-time screening for abdominal aortic aneurysm (AAA) and surgical repair of large AAAs by ultrasound is recommended for men aged 65-75 years who are current or former smokers.  Stay current with your vaccines (immunizations). Document Released: 03/08/2008 Document Revised: 09/15/2013 Document Reviewed: 02/05/2011 [facility] Patient Information 2015 [place], [place]. This information is not intended to replace advice given to you by your health care provider. Make sure you discuss any questions you have with your health care provider.

## 2014-12-03 NOTE — Progress Notes (Addendum)
Subjective:    Patient ID: Scott Pruitt, male    DOB: [birth year], 54 y.o.   MRN: [id number] This chart was scribed for Norberto Sorenson, MD by Littie Deeds, Medical Scribe. This patient was seen in Room 27 and the patient's care was started at 3:30 PM.  Chief Complaint  Patient presents with  . Annual Exam    HPI HPI Comments: Scott Pruitt is a 54 y.o. male who presents to the [facility] complaining of a complete physical exam.  Cholesterol was checked 2 months; his LDL was 153 so he was started on pravastatin in addition to his blood pressure medication. We had started him on HCTZ at the last office visit and also treated him for thrush.  Thrush: Patient states he never got the medication for thrush in time, but it resolved spontaneously after 2 days.  Elevated Cholesterol: He did start on the pravastatin. He did have a headache the very first day on the medications, but he has not had any other symptoms. Patient states he has been trying to improve his diet and has been conscious of nutrition labels. He has been taking a garlic pill. He denies chest pain and palpitations. Patient used to take a baby aspirin daily, but he has forgotten to do so.   Prostate: Patient has been taking an OTC prostate medication. He has not been having urinary frequency. He does not see a urologist.  Health Maintenance: Patient had a colonoscopy 3 years ago at [facility].  Exercise: He states he has been walking more, walking about 3-4 times a week.  History reviewed. No pertinent past medical history. Past Surgical History  Procedure Laterality Date  . Fracture surgery     Current Outpatient Prescriptions on File Prior to Visit  Medication Sig Dispense Refill  . pravastatin (PRAVACHOL) 40 MG tablet Take 1 tablet (40 mg total) by mouth daily. 90 tablet 1   No current facility-administered medications on file prior to visit.   No Known Allergies Family History  Problem Relation Age of  Onset  . Stroke Father   . Hypertension Sister    History   Social History  . Marital Status: Legally Separated    Spouse Name: N/A  . Number of Children: N/A  . Years of Education: N/A   Social History Main Topics  . Smoking status: Never Smoker   . Smokeless tobacco: Not on file  . Alcohol Use: 0.0 oz/week    0 Standard drinks or equivalent per week  . Drug Use: No  . Sexual Activity: No   Other Topics Concern  . None   Social History Narrative   Divorced; 65 year old son (2016)   Salesperson;   Lives alone     Review of Systems  Cardiovascular: Negative for chest pain and palpitations.  Allergic/Immunologic: Positive for environmental allergies.       Occasional problems with seasonal allergies.  All other systems reviewed and are negative.      Objective:  BP 136/86 mmHg  Pulse 80  Temp(Src) 97.9 F (36.6 C)  Resp 16  Ht  (1.778 m)  Wt 164 lb (74.39 kg)  BMI 23.53 kg/m2  SpO2 99%  Physical Exam  Constitutional: He is oriented to person, place, and time. He appears well-developed and well-nourished. No distress.  HENT:  Head: Normocephalic and atraumatic.  Right Ear: Tympanic membrane, external ear and ear canal normal.  Left Ear: Tympanic membrane, external ear and ear canal normal.  Nose: Nose normal.  Mouth/Throat: Oropharynx is clear and moist. No oropharyngeal exudate.  Normal nasal mucosa.   Eyes: Pupils are equal, round, and reactive to light.  Neck: Neck supple.  Cardiovascular: Normal rate, regular rhythm, S1 normal, S2 normal and normal heart sounds.   No murmur heard. 2+ pedal pulses.  Pulmonary/Chest: Effort normal and breath sounds normal. No respiratory distress. He has no wheezes. He has no rales.  Abdominal: Bowel sounds are normal.  Genitourinary: Prostate is enlarged.  Enlarged and boggy prostate, but no concerning knobs or masses.  Musculoskeletal: He exhibits no edema.  No lower extremity edema.  Neurological: He is  alert and oriented to person, place, and time. No cranial nerve deficit.  Skin: Skin is warm and dry. No rash noted.  Psychiatric: He has a normal mood and affect. His behavior is normal.  Nursing note and vitals reviewed.   EKG: NSR, no ischemic changes      Assessment & Plan:  Need for Tdap vaccination - Plan: Tdap vaccine greater than or equal to 7yo IM  Annual physical exam - Plan: POCT urinalysis dipstick, EKG 12-Lead - colonoscopy 2013 [facility]. Cons Hep C and HIV at f/u. Reviewed diet and exercise.  Essential hypertension, benign - Plan: EKG 12-Lead - much improved - cont lisinopril-hctz  Hyperlipidemia - Started pravastatin 6 wks prior so lfts today - f/u in 6 mos for flp  Polypharmacy - Plan: Comprehensive metabolic panel  Screening for prostate cancer - Plan: PSA, POCT UA - Microscopic Only, Urine culture    I personally performed the services described in this documentation, which was scribed in my presence. The recorded information has been reviewed and considered, and addended by me as needed.  Norberto Sorenson, MD MPH   Results for orders placed or performed in visit on 12/03/14  Comprehensive metabolic panel  Result Value Ref Range   Sodium 140 135 - 145 mEq/L   Potassium 3.9 3.5 - 5.3 mEq/L   Chloride 102 96 - 112 mEq/L   CO2 24 19 - 32 mEq/L   Glucose, Bld 87 70 - 99 mg/dL   BUN 15 6 - 23 mg/dL   Creat 1.61 0.96 - 0.45 mg/dL   Total Bilirubin 0.5 0.2 - 1.2 mg/dL   Alkaline Phosphatase 58 39 - 117 U/L   AST 21 0 - 37 U/L   ALT 27 0 - 53 U/L   Total Protein 7.3 6.0 - 8.3 g/dL   Albumin 4.6 3.5 - 5.2 g/dL   Calcium 9.5 8.4 - 40.9 mg/dL  PSA  Result Value Ref Range   PSA 1.39 <=4.00 ng/mL  POCT urinalysis dipstick  Result Value Ref Range   Color, UA yellow    Clarity, UA clear    Glucose, UA neg    Bilirubin, UA neg    Ketones, UA neg    Spec Grav, UA 1.015    Blood, UA neg    pH, UA 5.0    Protein, UA neg    Urobilinogen, UA 0.2    Nitrite, UA neg      Leukocytes, UA Negative   POCT UA - Microscopic Only  Result Value Ref Range   WBC, Ur, HPF, POC neg    RBC, urine, microscopic neg    Bacteria, U Microscopic neg    Mucus, UA neg    Epithelial cells, urine per micros 0-1    Crystals, Ur, HPF, POC neg    Casts, Ur, LPF, POC neg  Yeast, UA neg

## 2014-12-04 ENCOUNTER — Encounter: Payer: Self-pay | Admitting: Family Medicine

## 2014-12-04 LAB — PSA: PSA: 1.39 ng/mL (ref <=4.00)

## 2014-12-04 LAB — URINE CULTURE
Colony Count: NO GROWTH
Organism ID, Bacteria: NO GROWTH

## 2014-12-04 MED ORDER — LISINOPRIL-HYDROCHLOROTHIAZIDE 10-12.5 MG PO TABS: 1.0000 | ORAL_TABLET | Freq: Every day | ORAL | Status: DC

## 2014-12-05 ENCOUNTER — Other Ambulatory Visit: Payer: Self-pay | Admitting: Family Medicine

## 2014-12-08 ENCOUNTER — Other Ambulatory Visit: Payer: Self-pay | Admitting: Family Medicine

## 2015-04-17 ENCOUNTER — Ambulatory Visit (INDEPENDENT_AMBULATORY_CARE_PROVIDER_SITE_OTHER): Payer: 59 | Admitting: Urgent Care

## 2015-04-17 VITALS — BP 132/80 | HR 77 | Temp 97.4°F | Resp 18 | Ht 70.0 in | Wt 168.8 lb

## 2015-04-17 DIAGNOSIS — M545 Low back pain, unspecified: Secondary | ICD-10-CM

## 2015-04-17 DIAGNOSIS — M62830 Muscle spasm of back: Secondary | ICD-10-CM | POA: Insufficient documentation

## 2015-04-17 MED ORDER — CYCLOBENZAPRINE HCL 10 MG PO TABS: 10.0000 mg | ORAL_TABLET | Freq: Three times a day (TID) | ORAL | Status: DC | PRN

## 2015-04-17 MED ORDER — MELOXICAM 15 MG PO TABS: 7.5000 mg | ORAL_TABLET | Freq: Every day | ORAL | Status: DC

## 2015-04-17 NOTE — Progress Notes (Signed)
    MRN: [id number] DOB: Aug 10, 1961  Subjective:   Scott Pruitt is a 54 y.o. male presenting for chief complaint of Tailbone Pain  Reports 1 day history of right sided low back pain. Patient woke up with this pain yesterday, is sharp in nature worse with sudden movements, non-radiating. Patient works as a Publishing copy, primarily desk job. On Friday, patient was carrying heavy packs of water, pain started the next day. Has tried [employer] with some relief. But he is coming today because his back pain has not let up. Denies fever, weight loss, back pain when laying down, incontinence, rectal pain, numbness or tingling. Denies history of back surgeries, back injuries. Denies any other aggravating or relieving factors, no other questions or concerns.  Mannie has a current medication list which includes the following prescription(s): lisinopril-hydrochlorothiazide, pravastatin, and specialty vitamins products. He has No Known Allergies.  Thelbert  has no past medical history on file. Also  has past surgical history that includes Fracture surgery.  ROS As in subjective.  Objective:   Vitals: BP 132/80 mmHg  Pulse 77  Temp(Src) 97.4 F (36.3 C) (Oral)  Resp 18  Ht  (1.778 m)  Wt 168 lb 12.8 oz (76.567 kg)  BMI 24.22 kg/m2  SpO2 98%  Physical Exam  Constitutional: He is oriented to person, place, and time. He appears well-developed and well-nourished.  Cardiovascular: Normal rate.   Pulmonary/Chest: Effort normal.  Musculoskeletal:       Lumbar back: He exhibits decreased range of motion (flexion and extension), tenderness (mild tenderness over paraspinal muscles) and spasm. He exhibits no bony tenderness, no swelling, no edema, no deformity and no laceration.  Negative SLR. Patient is sitting and standing gingerly, favoring his left side.  Neurological: He is alert and oriented to person, place, and time. He has normal reflexes.  Skin: Skin is warm and dry. No rash  noted. No erythema. No pallor.    Assessment and Plan :   1. Right-sided low back pain without sciatica 2. Spasm of back muscles - Patient declined back x-ray, agreed to do this is his pain does not improve in the next 2 weeks - For now, back pain likely due to back spasms, deconditioning. Recommended he use NSAIDs, Flexeril, hot and cold packs, low back stretching exercises. - F/u in 2 weeks, consider x-ray, referral to ortho, MRI  Wallis Bamberg, PA-C [facility] [phone] 04/17/2015 8:24 AM

## 2015-04-17 NOTE — Patient Instructions (Signed)
Back Pain, Adult Low back pain is very common. About 1 in 5 people have back pain.The cause of low back pain is rarely dangerous. The pain often gets better over time.About half of people with a sudden onset of back pain feel better in just 2 weeks. About 8 in 10 people feel better by 6 weeks.  CAUSES Some common causes of back pain include:  Strain of the muscles or ligaments supporting the spine.  Wear and tear (degeneration) of the spinal discs.  Arthritis.  Direct injury to the back. DIAGNOSIS Most of the time, the direct cause of low back pain is not known.However, back pain can be treated effectively even when the exact cause of the pain is unknown.Answering your caregiver's questions about your overall health and symptoms is one of the most accurate ways to make sure the cause of your pain is not dangerous. If your caregiver needs more information, he or she may order lab work or imaging tests (X-rays or MRIs).However, even if imaging tests show changes in your back, this usually does not require surgery. HOME CARE INSTRUCTIONS For many people, back pain returns.Since low back pain is rarely dangerous, it is often a condition that people can learn to manageon their own.   Remain active. It is stressful on the back to sit or stand in one place. Do not sit, drive, or stand in one place for more than 30 minutes at a time. Take short walks on level surfaces as soon as pain allows.Try to increase the length of time you walk each day.  Do not stay in bed.Resting more than 1 or 2 days can delay your recovery.  Do not avoid exercise or work.Your body is made to move.It is not dangerous to be active, even though your back may hurt.Your back will likely heal faster if you return to being active before your pain is gone.  Pay attention to your body when you bend and lift. Many people have less discomfortwhen lifting if they bend their knees, keep the load close to their bodies,and  avoid twisting. Often, the most comfortable positions are those that put less stress on your recovering back.  Find a comfortable position to sleep. Use a firm mattress and lie on your side with your knees slightly bent. If you lie on your back, put a pillow under your knees.  Only take over-the-counter or prescription medicines as directed by your caregiver. Over-the-counter medicines to reduce pain and inflammation are often the most helpful.Your caregiver may prescribe muscle relaxant drugs.These medicines help dull your pain so you can more quickly return to your normal activities and healthy exercise.  Put ice on the injured area.  Put ice in a plastic bag.  Place a towel between your skin and the bag.  Leave the ice on for 15-20 minutes, 03-04 times a day for the first 2 to 3 days. After that, ice and heat may be alternated to reduce pain and spasms.  Ask your caregiver about trying back exercises and gentle massage. This may be of some benefit.  Avoid feeling anxious or stressed.Stress increases muscle tension and can worsen back pain.It is important to recognize when you are anxious or stressed and learn ways to manage it.Exercise is a great option. SEEK MEDICAL CARE IF:  You have pain that is not relieved with rest or medicine.  You have pain that does not improve in 1 week.  You have new symptoms.  You are generally not feeling well. SEEK   IMMEDIATE MEDICAL CARE IF:   You have pain that radiates from your back into your legs.  You develop new bowel or bladder control problems.  You have unusual weakness or numbness in your arms or legs.  You develop nausea or vomiting.  You develop abdominal pain.  You feel faint. Document Released: 09/10/2005 Document Revised: 03/11/2012 Document Reviewed: 01/12/2014 Exit[employer] Patient Information 2015 [employer]. This information is not intended to replace advice given to you by your health care provider. Make sure you  discuss any questions you have with your health care provider.  

## 2015-04-20 ENCOUNTER — Other Ambulatory Visit: Payer: Self-pay | Admitting: Family Medicine

## 2015-05-30 ENCOUNTER — Other Ambulatory Visit: Payer: Self-pay | Admitting: Physician Assistant

## 2015-06-01 NOTE — Progress Notes (Signed)
Subjective:    Patient ID: Scott Pruitt, male    DOB: 1960-12-29, 54 y.o.   MRN: [id number] Chief Complaint  Patient presents with  . Hypertension  . Hyperlipidemia    HPI  Scott Pruitt is a 54 yo male here for 6 mo f/u of his chronic medical conditions.  CPE was 03/16 when he was started on pravastatin as LDL was 153 and added on hctz - so needs repeat labs today. Working on tlc?    Checks BP outside office occ 130/79  No past medical history on file. Past Surgical History  Procedure Laterality Date  . Fracture surgery     Current Outpatient Prescriptions on File Prior to Visit  Medication Sig Dispense Refill  . lisinopril-hydrochlorothiazide (PRINZIDE,ZESTORETIC) 10-12.5 MG per tablet Take 1 tablet by mouth daily. 90 tablet 3  . pravastatin (PRAVACHOL) 40 MG tablet TAKE 1 TABLET (40 MG TOTAL) BY MOUTH DAILY "OV NEEDED FOR REFILLS" 30 tablet 0  . Specialty Vitamins Products (PROSTATE PO) Take by mouth.     No current facility-administered medications on file prior to visit.   Not on File Family History  Problem Relation Age of Onset  . Stroke Father   . Hypertension Sister    Social History   Social History  . Marital Status: Legally Separated    Spouse Name: N/A  . Number of Children: N/A  . Years of Education: N/A   Social History Main Topics  . Smoking status: Never Smoker   . Smokeless tobacco: None  . Alcohol Use: 0.0 oz/week    0 Standard drinks or equivalent per week  . Drug Use: No  . Sexual Activity: No   Other Topics Concern  . None   Social History Narrative   Divorced; 25 year old son (2016)   Salesperson;   Lives alone     Review of Systems  Constitutional: Negative for fever and chills.  Eyes: Negative for visual disturbance.  Respiratory: Negative for shortness of breath.   Cardiovascular: Negative for chest pain and leg swelling.  Neurological: Negative for dizziness, syncope, facial asymmetry, weakness, light-headedness and headaches.         Objective:  BP 129/80 mmHg  Pulse 71  Temp(Src) 97.5 F (36.4 C) (Oral)  Resp 16  Wt 163 lb (73.936 kg)  Physical Exam  Constitutional: He is oriented to person, place, and time. He appears well-developed and well-nourished. No distress.  HENT:  Head: Normocephalic and atraumatic.  Eyes: Conjunctivae are normal. Pupils are equal, round, and reactive to light. No scleral icterus.  Neck: Normal range of motion. Neck supple. No thyromegaly present.  Cardiovascular: Normal rate, regular rhythm, normal heart sounds and intact distal pulses.   Pulmonary/Chest: Effort normal and breath sounds normal. No respiratory distress.  Musculoskeletal: He exhibits no edema.  Lymphadenopathy:    He has no cervical adenopathy.  Neurological: He is alert and oriented to person, place, and time.  Skin: Skin is warm and dry. He is not diaphoretic.  Psychiatric: He has a normal mood and affect. His behavior is normal.          Assessment & Plan:   1. Essential hypertension, benign - cont lisinopril-hctz, refill prn.  2. Hyperlipidemia - LDL well controlled, cont pravastatin 40.  3. Encounter for long-term (current) use of medications     Orders Placed This Encounter  Procedures  . Lipid panel    Order Specific Question:  Has the patient fasted?    Answer:  Yes  . Comprehensive metabolic panel    Order Specific Question:  Has the patient fasted?    Answer:  Yes   Norberto Sorenson, MD MPH  Results for orders placed or performed in visit on 06/02/15  Lipid panel  Result Value Ref Range   Cholesterol 140 125 - 200 mg/dL   Triglycerides 83 <161 mg/dL   HDL 38 (L) >=09 mg/dL   Total CHOL/HDL Ratio 3.7 <=5.0 Ratio   VLDL 17 <30 mg/dL   LDL Cholesterol 85 <604 mg/dL  Comprehensive metabolic panel  Result Value Ref Range   Sodium 141 135 - 146 mmol/L   Potassium 4.6 3.5 - 5.3 mmol/L   Chloride 103 98 - 110 mmol/L   CO2 30 20 - 31 mmol/L   Glucose, Bld 91 65 - 99 mg/dL   BUN 13 7 - 25  mg/dL   Creat 5.40 9.81 - 1.91 mg/dL   Total Bilirubin 0.4 0.2 - 1.2 mg/dL   Alkaline Phosphatase 49 40 - 115 U/L   AST 20 10 - 35 U/L   ALT 17 9 - 46 U/L   Total Protein 6.8 6.1 - 8.1 g/dL   Albumin 4.3 3.6 - 5.1 g/dL   Calcium 9.4 8.6 - 47.8 mg/dL

## 2015-06-02 ENCOUNTER — Encounter: Payer: Self-pay | Admitting: Family Medicine

## 2015-06-02 ENCOUNTER — Ambulatory Visit (INDEPENDENT_AMBULATORY_CARE_PROVIDER_SITE_OTHER): Payer: 59 | Admitting: Family Medicine

## 2015-06-02 VITALS — BP 129/80 | HR 71 | Temp 97.5°F | Resp 16 | Wt 163.0 lb

## 2015-06-02 DIAGNOSIS — E785 Hyperlipidemia, unspecified: Secondary | ICD-10-CM | POA: Diagnosis not present

## 2015-06-02 DIAGNOSIS — I10 Essential (primary) hypertension: Secondary | ICD-10-CM

## 2015-06-02 DIAGNOSIS — Z79899 Other long term (current) drug therapy: Secondary | ICD-10-CM | POA: Diagnosis not present

## 2015-06-02 LAB — COMPREHENSIVE METABOLIC PANEL
ALT: 17 U/L (ref 9–46)
AST: 20 U/L (ref 10–35)
Albumin: 4.3 g/dL (ref 3.6–5.1)
Alkaline Phosphatase: 49 U/L (ref 40–115)
BUN: 13 mg/dL (ref 7–25)
CO2: 30 mmol/L (ref 20–31)
Calcium: 9.4 mg/dL (ref 8.6–10.3)
Chloride: 103 mmol/L (ref 98–110)
Creat: 1.1 mg/dL (ref 0.70–1.33)
Glucose, Bld: 91 mg/dL (ref 65–99)
Potassium: 4.6 mmol/L (ref 3.5–5.3)
Sodium: 141 mmol/L (ref 135–146)
Total Bilirubin: 0.4 mg/dL (ref 0.2–1.2)
Total Protein: 6.8 g/dL (ref 6.1–8.1)

## 2015-06-02 LAB — LIPID PANEL
Cholesterol: 140 mg/dL (ref 125–200)
HDL: 38 mg/dL — ABNORMAL LOW (ref >=40)
LDL Cholesterol: 85 mg/dL (ref <130)
Total CHOL/HDL Ratio: 3.7 Ratio (ref <=5.0)
Triglycerides: 83 mg/dL (ref <150)
VLDL: 17 mg/dL (ref <30)

## 2015-06-03 ENCOUNTER — Ambulatory Visit: Payer: 59 | Admitting: Family Medicine

## 2015-06-13 ENCOUNTER — Telehealth: Payer: Self-pay

## 2015-06-13 NOTE — Telephone Encounter (Signed)
Left message for pt to call back  °

## 2015-06-13 NOTE — Telephone Encounter (Signed)
He is on the same medication that I prescribed him in March - we did not change it at his last visit.  So concern it is not due to his BP med. Should def stop the prostate supp and come into clinic for UA if sxs persist. Can put into my clinic Thurs if he wants.

## 2015-06-13 NOTE — Telephone Encounter (Signed)
Going to bathroom a lot more with the medication and was wondering what are the options that he can take because he doesn't like that.  Please advise

## 2015-06-13 NOTE — Telephone Encounter (Signed)
Spoke with pt, advised message from Dr. Shaw. Pt understood. 

## 2015-06-13 NOTE — Telephone Encounter (Signed)
Spoke with pt, he states the lisinopril-hydrochlorothiazide (PRINZIDE,ZESTORETIC) 10-12.5 MG per tablet [[id number]] is making him go to the bathroom now. He is also super-beta prostate so he is not sure if this is causing this. He states he went to the bathroom 3-4 times last night. Please advise.

## 2015-06-21 MED ORDER — PRAVASTATIN SODIUM 40 MG PO TABS: 40.0000 mg | ORAL_TABLET | Freq: Every day | ORAL | Status: DC

## 2015-12-03 ENCOUNTER — Other Ambulatory Visit: Payer: Self-pay | Admitting: Family Medicine

## 2015-12-14 ENCOUNTER — Telehealth: Payer: Self-pay | Admitting: Family Medicine

## 2015-12-14 DIAGNOSIS — Z1211 Encounter for screening for malignant neoplasm of colon: Secondary | ICD-10-CM

## 2015-12-14 NOTE — Telephone Encounter (Signed)
Spoke with patient after calling [facility] to get a copy of his colonoscopy.  He was never seen there.  We went ahead and referred him for a colonoscopy today

## 2015-12-15 NOTE — Telephone Encounter (Signed)
Fine, has been >1 yr since pt's last CPE

## 2015-12-28 NOTE — Progress Notes (Signed)
Subjective:    Patient ID: Scott Pruitt, male    DOB: [birth year], 55 y.o.   MRN: [id number] Chief Complaint  Patient presents with  . Annual Exam    HPI  Mr. Scott Pruitt is a delightful 55 yo male here today for his CPE - last was 1 yr prior by myself. Did get the flu about 4-6 wks Prevention: CRS: Colonoscopy from [facility] 2012 or 2013 EKG: 2016  Imm: TDaP 2016; flu?? a1c: 1 yr prior -> 5.1  HTN: started on lisinopril-hctz 10-12.5 a yr ago.  Checks BP outside of office - usu runs  HPL: started pravastatin 40 last yr as ASCVD risk was 10.1%, LDL 153 ->85 Working on tlc - plays close attention to nutrition labels, walks for exercise 3-4x/wk  Supp: takes a garlic pill, asa 81, prostate supp,   No past medical history on file. Past Surgical History  Procedure Laterality Date  . Fracture surgery     Current Outpatient Prescriptions on File Prior to Visit  Medication Sig Dispense Refill  . lisinopril-hydrochlorothiazide (PRINZIDE,ZESTORETIC) 10-12.5 MG tablet TAKE 1 TABLET BY MOUTH DAILY. 90 tablet 0  . pravastatin (PRAVACHOL) 40 MG tablet Take 1 tablet (40 mg total) by mouth at bedtime. 90 tablet 4  . Specialty Vitamins Products (PROSTATE PO) Take by mouth.     No current facility-administered medications on file prior to visit.   No Known Allergies Family History  Problem Relation Age of Onset  . Stroke Father   . Hypertension Sister    Social History   Social History  . Marital Status: Legally Separated    Spouse Name: N/A  . Number of Children: N/A  . Years of Education: N/A   Social History Main Topics  . Smoking status: Never Smoker   . Smokeless tobacco: None  . Alcohol Use: 0.0 oz/week    0 Standard drinks or equivalent per week  . Drug Use: No  . Sexual Activity: No   Other Topics Concern  . 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      Objective:  BP 135/87 mmHg  Pulse 71  Temp(Src) 97.8 F (36.6 C)  Resp 16  Ht  (1.778 m)  Wt 164 lb (74.39 kg)  BMI 23.53 kg/m2  Physical Exam  Constitutional: He is oriented to person, place, and time. He appears well-developed and well-nourished. No distress.  HENT:  Head: Normocephalic and atraumatic.  Right Ear: Tympanic membrane, external ear and ear canal normal.  Left Ear: Tympanic membrane, external ear and ear canal normal.  Nose: Nose normal.  Mouth/Throat: Uvula is midline, oropharynx is clear and moist and mucous membranes are normal. No oropharyngeal exudate.  Eyes: Conjunctivae are normal. Right eye exhibits no discharge. Left eye exhibits no discharge. No scleral icterus.  Neck: Normal range of motion. Neck supple. No thyromegaly present.  Cardiovascular: Normal rate, regular rhythm, normal heart sounds and intact distal pulses.   Pulmonary/Chest: Effort normal and breath sounds normal. No respiratory distress.  Abdominal: Soft. Bowel sounds are normal. He exhibits no distension and no mass. There is no tenderness. There is no rebound and no guarding.  Musculoskeletal: He exhibits no edema.  Lymphadenopathy:    He has no cervical adenopathy.  Neurological: He is alert and oriented to person, place, and time. He has normal reflexes.  No cranial nerve deficit. He exhibits normal muscle tone.  Skin: Skin is warm and dry. No rash noted. He is not diaphoretic. No erythema.  Psychiatric: He has a normal mood and affect. His behavior is normal.      Assessment & Plan:   1. Annual physical exam   2. Routine screening for STI (sexually transmitted infection)   3. Screening for cardiovascular, respiratory, and genitourinary diseases   4. Screening for deficiency anemia   5. Screening for prostate cancer   6. Screening for thyroid disorder   7. Essential hypertension, benign   8. Hyperlipidemia   9. Encounter for long-term (current) use of  medications     Orders Placed This Encounter  Procedures  . TSH  . HIV antibody  . Lipid panel    Order Specific Question:  Has the patient fasted?    Answer:  Yes  . CBC  . Comprehensive metabolic panel    Order Specific Question:  Has the patient fasted?    Answer:  Yes  . PSA  . Hepatitis C Antibody  . POCT urinalysis dipstick    Meds ordered this encounter  Medications  . aspirin 81 MG tablet    Sig: Take 81 mg by mouth daily.   F/u in 1 yr for repeat CPE  Norberto Sorenson, MD MPH  Results for orders placed or performed in visit on 12/29/15  TSH  Result Value Ref Range   TSH 0.80 0.40 - 4.50 mIU/L  HIV antibody  Result Value Ref Range   HIV 1&2 Ab, 4th Generation NONREACTIVE NONREACTIVE  Lipid panel  Result Value Ref Range   Cholesterol 147 125 - 200 mg/dL   Triglycerides 66 <161 mg/dL   HDL 46 >=09 mg/dL   Total CHOL/HDL Ratio 3.2 <=5.0 Ratio   VLDL 13 <30 mg/dL   LDL Cholesterol 88 <604 mg/dL  CBC  Result Value Ref Range   WBC 6.1 3.8 - 10.8 K/uL   RBC 4.99 4.20 - 5.80 MIL/uL   Hemoglobin 15.4 13.2 - 17.1 g/dL   HCT 54.0 98.1 - 19.1 %   MCV 93.0 80.0 - 100.0 fL   MCH 30.9 27.0 - 33.0 pg   MCHC 33.2 32.0 - 36.0 g/dL   RDW 47.8 29.5 - 62.1 %   Platelets 234 140 - 400 K/uL   MPV 11.7 7.5 - 12.5 fL  Comprehensive metabolic panel  Result Value Ref Range   Sodium 142 135 - 146 mmol/L   Potassium 4.4 3.5 - 5.3 mmol/L   Chloride 106 98 - 110 mmol/L   CO2 26 20 - 31 mmol/L   Glucose, Bld 86 65 - 99 mg/dL   BUN 15 7 - 25 mg/dL   Creat 3.08 6.57 - 8.46 mg/dL   Total Bilirubin 0.6 0.2 - 1.2 mg/dL   Alkaline Phosphatase 44 40 - 115 U/L   AST 18 10 - 35 U/L   ALT 18 9 - 46 U/L   Total Protein 7.0 6.1 - 8.1 g/dL   Albumin 4.4 3.6 - 5.1 g/dL   Calcium 9.3 8.6 - 96.2 mg/dL  PSA  Result Value Ref Range   PSA 1.15 <=4.00 ng/mL  Hepatitis C Antibody  Result Value Ref Range   HCV Ab NEGATIVE NEGATIVE  POCT urinalysis dipstick  Result Value Ref Range   Color, UA  yellow yellow   Clarity, UA clear clear   Glucose, UA negative negative   Bilirubin, UA negative negative   Ketones, POC UA  negative negative   Spec Grav, UA 1.020    Blood, UA trace-intact (A) negative   pH, UA 7.0    Protein Ur, POC negative negative   Urobilinogen, UA 0.2    Nitrite, UA Negative Negative   Leukocytes, UA Negative Negative

## 2015-12-29 ENCOUNTER — Encounter: Payer: Self-pay | Admitting: Family Medicine

## 2015-12-29 ENCOUNTER — Ambulatory Visit (INDEPENDENT_AMBULATORY_CARE_PROVIDER_SITE_OTHER): Payer: 59 | Admitting: Family Medicine

## 2015-12-29 VITALS — BP 135/87 | HR 71 | Temp 97.8°F | Resp 16 | Ht 70.0 in | Wt 164.0 lb

## 2015-12-29 DIAGNOSIS — E785 Hyperlipidemia, unspecified: Secondary | ICD-10-CM

## 2015-12-29 DIAGNOSIS — I10 Essential (primary) hypertension: Secondary | ICD-10-CM

## 2015-12-29 DIAGNOSIS — Z130 Encounter for screening for diseases of the blood and blood-forming organs and certain disorders involving the immune mechanism: Secondary | ICD-10-CM

## 2015-12-29 DIAGNOSIS — Z1389 Encounter for screening for other disorder: Secondary | ICD-10-CM | POA: Diagnosis not present

## 2015-12-29 DIAGNOSIS — Z1329 Encounter for screening for other suspected endocrine disorder: Secondary | ICD-10-CM

## 2015-12-29 DIAGNOSIS — Z125 Encounter for screening for malignant neoplasm of prostate: Secondary | ICD-10-CM

## 2015-12-29 DIAGNOSIS — Z0000 Encounter for general adult medical examination without abnormal findings: Secondary | ICD-10-CM | POA: Diagnosis not present

## 2015-12-29 DIAGNOSIS — Z79899 Other long term (current) drug therapy: Secondary | ICD-10-CM | POA: Diagnosis not present

## 2015-12-29 DIAGNOSIS — Z113 Encounter for screening for infections with a predominantly sexual mode of transmission: Secondary | ICD-10-CM

## 2015-12-29 DIAGNOSIS — Z1383 Encounter for screening for respiratory disorder NEC: Secondary | ICD-10-CM

## 2015-12-29 DIAGNOSIS — Z136 Encounter for screening for cardiovascular disorders: Secondary | ICD-10-CM | POA: Diagnosis not present

## 2015-12-29 LAB — POCT URINALYSIS DIP (MANUAL ENTRY)
Bilirubin, UA: NEGATIVE
Glucose, UA: NEGATIVE
Ketones, POC UA: NEGATIVE
Leukocytes, UA: NEGATIVE
Nitrite, UA: NEGATIVE
Protein Ur, POC: NEGATIVE
Spec Grav, UA: 1.02
Urobilinogen, UA: 0.2
pH, UA: 7

## 2015-12-29 LAB — COMPREHENSIVE METABOLIC PANEL
ALT: 18 U/L (ref 9–46)
AST: 18 U/L (ref 10–35)
Albumin: 4.4 g/dL (ref 3.6–5.1)
Alkaline Phosphatase: 44 U/L (ref 40–115)
BUN: 15 mg/dL (ref 7–25)
CO2: 26 mmol/L (ref 20–31)
Calcium: 9.3 mg/dL (ref 8.6–10.3)
Chloride: 106 mmol/L (ref 98–110)
Creat: 0.99 mg/dL (ref 0.70–1.33)
Glucose, Bld: 86 mg/dL (ref 65–99)
Potassium: 4.4 mmol/L (ref 3.5–5.3)
Sodium: 142 mmol/L (ref 135–146)
Total Bilirubin: 0.6 mg/dL (ref 0.2–1.2)
Total Protein: 7 g/dL (ref 6.1–8.1)

## 2015-12-29 LAB — LIPID PANEL
Cholesterol: 147 mg/dL (ref 125–200)
HDL: 46 mg/dL (ref >=40)
LDL Cholesterol: 88 mg/dL (ref <130)
Total CHOL/HDL Ratio: 3.2 Ratio (ref <=5.0)
Triglycerides: 66 mg/dL (ref <150)
VLDL: 13 mg/dL (ref <30)

## 2015-12-29 LAB — CBC
HCT: 46.4 % (ref 38.5–50.0)
Hemoglobin: 15.4 g/dL (ref 13.2–17.1)
MCH: 30.9 pg (ref 27.0–33.0)
MCHC: 33.2 g/dL (ref 32.0–36.0)
MCV: 93 fL (ref 80.0–100.0)
MPV: 11.7 fL (ref 7.5–12.5)
Platelets: 234 10*3/uL (ref 140–400)
RBC: 4.99 MIL/uL (ref 4.20–5.80)
RDW: 13.5 % (ref 11.0–15.0)
WBC: 6.1 10*3/uL (ref 3.8–10.8)

## 2015-12-29 LAB — TSH: TSH: 0.8 mIU/L (ref 0.40–4.50)

## 2015-12-29 LAB — HIV ANTIBODY (ROUTINE TESTING W REFLEX): HIV 1&2 Ab, 4th Generation: NONREACTIVE

## 2015-12-29 LAB — HEPATITIS C ANTIBODY: HCV Ab: NEGATIVE

## 2015-12-29 NOTE — Patient Instructions (Signed)
     IF you received an x-ray today, you will receive an invoice from [facility]. Please contact  Radiology at [phone] with questions or concerns regarding your invoice.   IF you received labwork today, you will receive an invoice from [facility]. Please contact [facility] at [phone] with questions or concerns regarding your invoice.   Our billing staff will not be able to assist you with questions regarding bills from these companies.  You will be contacted with the lab results as soon as they are available. The fastest way to get your results is to activate your My Chart account. Instructions are located on the last page of this paperwork. If you have not heard from us regarding the results in 2 weeks, please contact this office.      

## 2015-12-30 ENCOUNTER — Encounter: Payer: Self-pay | Admitting: Family Medicine

## 2015-12-30 LAB — PSA: PSA: 1.15 ng/mL (ref <=4.00)

## 2016-01-13 ENCOUNTER — Telehealth: Payer: Self-pay

## 2016-01-13 NOTE — Telephone Encounter (Signed)
Patient called and asked to speak to Dr. Clelia CroftShaw. I asked what in regards too and he states to just have her give him a call please.  [phone]

## 2016-01-17 NOTE — Telephone Encounter (Signed)
Call and LVM

## 2016-01-22 ENCOUNTER — Encounter: Payer: Self-pay | Admitting: Family Medicine

## 2016-01-22 DIAGNOSIS — E785 Hyperlipidemia, unspecified: Secondary | ICD-10-CM | POA: Insufficient documentation

## 2016-01-22 DIAGNOSIS — E7800 Pure hypercholesterolemia, unspecified: Secondary | ICD-10-CM | POA: Insufficient documentation

## 2016-01-22 DIAGNOSIS — I10 Essential (primary) hypertension: Secondary | ICD-10-CM | POA: Insufficient documentation

## 2016-01-22 MED ORDER — LISINOPRIL-HYDROCHLOROTHIAZIDE 10-12.5 MG PO TABS: 1.0000 | ORAL_TABLET | Freq: Every day | ORAL | Status: DC

## 2016-01-22 MED ORDER — PRAVASTATIN SODIUM 40 MG PO TABS: 40.0000 mg | ORAL_TABLET | Freq: Every day | ORAL | Status: DC

## 2016-03-12 NOTE — Telephone Encounter (Signed)
Unable to reach patient. Left message stating that if he still needs assistants from Dr. Clelia CroftShaw to contact office

## 2016-03-12 NOTE — Telephone Encounter (Signed)
I was never able to get a hold of pt - please see if he still has a question. Thanks.

## 2017-03-23 ENCOUNTER — Other Ambulatory Visit: Payer: Self-pay | Admitting: Family Medicine

## 2017-03-23 NOTE — Telephone Encounter (Signed)
Pt is overdue for a f/u OV with FASTING labs - ok to sched this for CPE if pt prefers. Please have him sched an appt within the next month so we can continue to make sure the medicines are keeping him healthy and safe before we refill them further. Thanks.

## 2017-03-28 ENCOUNTER — Other Ambulatory Visit: Payer: Self-pay | Admitting: Family Medicine

## 2017-03-28 DIAGNOSIS — R200 Anesthesia of skin: Secondary | ICD-10-CM | POA: Diagnosis not present

## 2017-03-28 DIAGNOSIS — M79602 Pain in left arm: Secondary | ICD-10-CM | POA: Diagnosis not present

## 2017-03-30 NOTE — Telephone Encounter (Signed)
Has appt sched for 8/9

## 2017-04-26 ENCOUNTER — Other Ambulatory Visit: Payer: Self-pay | Admitting: Family Medicine

## 2017-04-27 ENCOUNTER — Other Ambulatory Visit: Payer: Self-pay | Admitting: Family Medicine

## 2017-05-02 ENCOUNTER — Encounter: Payer: 59 | Admitting: Family Medicine

## 2017-05-03 ENCOUNTER — Other Ambulatory Visit: Payer: Self-pay | Admitting: Family Medicine

## 2017-05-03 NOTE — Telephone Encounter (Signed)
Called and spoke with patient. He states he has an appointment on 05/07/17, completely out of lisinopril. Giving refill of 4 pills to get patient through until his appointment on 05/07/17.

## 2017-05-06 NOTE — Progress Notes (Signed)
Subjective:    Patient ID: Scott Pruitt, male    DOB: Feb 10, 1961, 56 y.o.   MRN: [id number] Chief Complaint  Patient presents with  . Annual Exam    HPI  Mr. Scott Pruitt is a delightful 56 yo male here today for his CPE - I last saw him approx 1.5 yrs prior for the same. l  Primary Preventative Screenings: Prostate Cancer:  Lab Results  Component Value Date   PSA 1.15 12/29/2015   PSA 1.39 12/03/2014   STI screening: Neg HIV & Hep C 12/2015 Colorectal Cancer: Colonoscopy from [facility] 2012 or 2013 - doesn't remember result H/o tobacco use AAA/Lung: No h/o tob use Cardiac: EKG: 11/2014  Weight/blood sugar: BMI nml Lab Results  Component Value Date   HGBA1C 5.1 10/12/2014   OTC/vit/supp/herbal: asa 81, prostate supp,  Diet/Exercise: Working on tlc - plays close attention to nutrition labels, walks for exercise 3-4x/wk but less recently - does track steps on phone to get 5000 steps/d Dentist/Optho: sees both regularly. Immunizations: does not get flu shot but is going to reconsider this yr. Immunization History  Administered Date(s) Administered  . Tdap 12/03/2014    Chronic Medical Conditions: HTN: started on lisinopril-hctz 10-12.5 2016.  Took last pills yesterday and was out for a few days last week. Checks BP outside of office - usu runs good but has been increasing recently more to 140s/80s-90s. No side effects.  HLD: started pravastatin 40 2016 as ASCVD risk was 10.1% with LDL 153 with excellent response.    Lab Results  Component Value Date   LDLCALC 88 12/29/2015   LDLCALC 85 06/02/2015   LDLCALC 153 (H) 10/12/2014   1 mo prior developed left tennis elbow sxs - wakes with stff elbow when waking up an dsleeping on his arm.  Went to [facility] when his pain moved up to h is left shoulder and he was treated with a prednisone taper which relieved shoulder pain but lateral elbow pain continued.  Tried some bengay w/o sig relief.  Lateral elbow pain was only improved for 1d on  the steroid pack.     Past Medical History:  Diagnosis Date  . Hyperlipidemia   . Hypertension    Past Surgical History:  Procedure Laterality Date  . FRACTURE SURGERY     Current Outpatient Prescriptions on File Prior to Visit  Medication Sig Dispense Refill  . aspirin 81 MG tablet Take 81 mg by mouth daily.    Marland Kitchen. lisinopril-hydrochlorothiazide (PRINZIDE,ZESTORETIC) 10-12.5 MG tablet TAKE 1 TABLET BY MOUTH DAILY**NEEDS OFFICE VISIT FOR ANY FURTHER REFILLS** 4 tablet 0  . pravastatin (PRAVACHOL) 40 MG tablet Take 1 tablet (40 mg total) by mouth at bedtime. **Needs office visit for any additional refills** 30 tablet 0  . Specialty Vitamins Products (PROSTATE PO) Take by mouth.     No current facility-administered medications on file prior to visit.    No Known Allergies Family History  Problem Relation Age of Onset  . Stroke Father   . Hypertension Sister    Social History   Social History  . Marital status: Scott Pruitt    Spouse name: N/A  . Number of children: N/A  . Years of education: N/A   Social History Main Topics  . Smoking status: Never Smoker  . Smokeless tobacco: Never Used  . Alcohol use 0.0 oz/week  . Drug use: No  . Sexual activity: No   Other Topics Concern  . None   Social History Narrative  Divorced; 45 year old son (2016)   Scott Pruitt;   Lives alone  His fiance is 57 yo and she is 50, 16, 57 yo and she is pregnant with a girl and due on 11/30  Depression screen PHQ 2/9 05/07/2017 12/29/2015 06/02/2015 04/17/2015 12/03/2014  Decreased Interest 0 0 0 0 0  Down, Depressed, Hopeless 0 0 0 0 0  PHQ - 2 Score 0 0 0 0 0    Review of Systems  See above list by Scott Pruitt  Objective:   Physical Exam  Constitutional: He is oriented to person, place, and time. He appears well-developed and well-nourished. No distress.  HENT:  Head: Normocephalic and atraumatic.  Right Ear: Tympanic membrane, external ear and ear canal normal.  Left Ear: Tympanic  membrane, external ear and ear canal normal.  Nose: Nose normal.  Mouth/Throat: Uvula is midline, oropharynx is clear and moist and mucous membranes are normal. No oropharyngeal exudate.  Eyes: Conjunctivae are normal. Right eye exhibits no discharge. Left eye exhibits no discharge. No scleral icterus.  Neck: Normal range of motion. Neck supple. No thyromegaly present.  Cardiovascular: Normal rate, regular rhythm, normal heart sounds and intact distal pulses.   Pulmonary/Chest: Effort normal and breath sounds normal. No respiratory distress.  Abdominal: Soft. Bowel sounds are normal. He exhibits no distension and no mass. There is no tenderness. There is no rebound and no guarding.  Genitourinary: Rectum normal and prostate normal. Rectal exam shows no mass and anal tone normal. Prostate is not enlarged and not tender.  Musculoskeletal: He exhibits no edema.  Lymphadenopathy:    He has no cervical adenopathy.  Neurological: He is alert and oriented to person, place, and time. He has normal reflexes. No cranial nerve deficit. He exhibits normal muscle tone.  Skin: Skin is warm and dry. No rash noted. He is not diaphoretic. No erythema.  Psychiatric: He has a normal mood and affect. His behavior is normal.      BP (!) 158/99   Pulse 71   Temp (!) 97.5 F (36.4 C) (Oral)   Resp 18   Ht 5\' 9"  (1.753 m)   Wt 164 lb 12.8 oz (74.8 kg)   SpO2 97%   BMI 24.34 kg/m     Dg Elbow Complete Left (3+view)  Result Date: 05/07/2017 CLINICAL DATA:  Persistent pain adjacent to the lateral epicondyles for over 1 month. No known injury. EXAM: LEFT ELBOW - COMPLETE 3+ VIEW COMPARISON:  None. FINDINGS: There is no evidence of fracture, dislocation, or joint effusion. There is no evidence of arthropathy or other focal bone abnormality. Soft tissues are unremarkable. IMPRESSION: Negative. Electronically Signed   By: Francene Boyers M.D.   On: 05/07/2017 09:48    Assessment & Plan:  Call [facility] to get copy  of last colonoscopy for chart Ua, cbc, cmp, lipid, tsh, psa  1. Annual physical exam   2. Routine screening for STI (sexually transmitted infection)   3. Screening for cardiovascular, respiratory, and genitourinary diseases   4. Screening for colorectal cancer   5. Screening for deficiency anemia   6. Screening for prostate cancer   7. Screening for thyroid disorder   8. Essential hypertension, benign   9. Pure hypercholesterolemia   10. Left elbow pain     Orders Placed This Encounter  Procedures  . DG ELBOW COMPLETE LEFT (3+VIEW)    Standing Status:   Future    Number of Occurrences:   1    Standing Expiration Date:  05/07/2018    Order Specific Question:   Reason for Exam (SYMPTOM  OR DIAGNOSIS REQUIRED)    Answer:   persistent pain proximal to left lateral epicondyle for > 1 mo unresponsive to prednisone    Order Specific Question:   Preferred imaging location?    Answer:   External  . CBC  . Comprehensive metabolic panel    Order Specific Question:   Has the patient fasted?    Answer:   Yes  . Lipid panel    Order Specific Question:   Has the patient fasted?    Answer:   Yes  . TSH  . PSA  . Care order/instruction:    Scheduling Instructions:     Recheck BP  . POCT urinalysis dipstick    Meds ordered this encounter  Medications  . pravastatin (PRAVACHOL) 40 MG tablet    Sig: Take 1 tablet (40 mg total) by mouth at bedtime.    Dispense:  90 tablet    Refill:  3  . lisinopril-hydrochlorothiazide (PRINZIDE,ZESTORETIC) 20-25 MG tablet    Sig: Take 1 tablet by mouth daily.    Dispense:  90 tablet    Refill:  3  . meloxicam (MOBIC) 15 MG tablet    Sig: Take 1 tablet (15 mg total) by mouth daily.    Dispense:  30 tablet    Refill:  0     Norberto Sorenson, M.D.  Primary Care at [facility] [address] [place], [place] [address] [phone] phone [phone] fax  05/09/17 11:03 PM

## 2017-05-07 ENCOUNTER — Ambulatory Visit (INDEPENDENT_AMBULATORY_CARE_PROVIDER_SITE_OTHER): Payer: 59 | Admitting: Family Medicine

## 2017-05-07 ENCOUNTER — Encounter: Payer: Self-pay | Admitting: Family Medicine

## 2017-05-07 ENCOUNTER — Ambulatory Visit (INDEPENDENT_AMBULATORY_CARE_PROVIDER_SITE_OTHER): Payer: 59

## 2017-05-07 VITALS — BP 164/98 | HR 71 | Temp 97.5°F | Resp 18 | Ht 69.0 in | Wt 164.8 lb

## 2017-05-07 DIAGNOSIS — Z136 Encounter for screening for cardiovascular disorders: Secondary | ICD-10-CM | POA: Diagnosis not present

## 2017-05-07 DIAGNOSIS — I10 Essential (primary) hypertension: Secondary | ICD-10-CM

## 2017-05-07 DIAGNOSIS — Z125 Encounter for screening for malignant neoplasm of prostate: Secondary | ICD-10-CM

## 2017-05-07 DIAGNOSIS — Z1212 Encounter for screening for malignant neoplasm of rectum: Secondary | ICD-10-CM | POA: Diagnosis not present

## 2017-05-07 DIAGNOSIS — Z1389 Encounter for screening for other disorder: Secondary | ICD-10-CM | POA: Diagnosis not present

## 2017-05-07 DIAGNOSIS — Z1383 Encounter for screening for respiratory disorder NEC: Secondary | ICD-10-CM

## 2017-05-07 DIAGNOSIS — M25522 Pain in left elbow: Secondary | ICD-10-CM

## 2017-05-07 DIAGNOSIS — E7800 Pure hypercholesterolemia, unspecified: Secondary | ICD-10-CM | POA: Diagnosis not present

## 2017-05-07 DIAGNOSIS — Z1211 Encounter for screening for malignant neoplasm of colon: Secondary | ICD-10-CM | POA: Diagnosis not present

## 2017-05-07 DIAGNOSIS — Z130 Encounter for screening for diseases of the blood and blood-forming organs and certain disorders involving the immune mechanism: Secondary | ICD-10-CM | POA: Diagnosis not present

## 2017-05-07 DIAGNOSIS — Z0000 Encounter for general adult medical examination without abnormal findings: Secondary | ICD-10-CM | POA: Diagnosis not present

## 2017-05-07 DIAGNOSIS — Z1329 Encounter for screening for other suspected endocrine disorder: Secondary | ICD-10-CM | POA: Diagnosis not present

## 2017-05-07 DIAGNOSIS — Z113 Encounter for screening for infections with a predominantly sexual mode of transmission: Secondary | ICD-10-CM | POA: Diagnosis not present

## 2017-05-07 LAB — POCT URINALYSIS DIP (MANUAL ENTRY)
Bilirubin, UA: NEGATIVE
Glucose, UA: NEGATIVE mg/dL
Ketones, POC UA: NEGATIVE mg/dL
Leukocytes, UA: NEGATIVE
Nitrite, UA: NEGATIVE
Protein Ur, POC: NEGATIVE mg/dL
Spec Grav, UA: 1.025 (ref 1.010–1.025)
Urobilinogen, UA: 0.2 E.U./dL
pH, UA: 5 (ref 5.0–8.0)

## 2017-05-07 MED ORDER — PRAVASTATIN SODIUM 40 MG PO TABS: 40.0000 mg | ORAL_TABLET | Freq: Every day | ORAL | 3 refills | Status: DC

## 2017-05-07 MED ORDER — LISINOPRIL-HYDROCHLOROTHIAZIDE 20-25 MG PO TABS: 1.0000 | ORAL_TABLET | Freq: Every day | ORAL | 3 refills | Status: DC

## 2017-05-07 MED ORDER — MELOXICAM 15 MG PO TABS: 15.0000 mg | ORAL_TABLET | Freq: Every day | ORAL | 0 refills | Status: DC

## 2017-05-07 NOTE — Patient Instructions (Signed)
Managing Your Hypertension Hypertension is commonly called high blood pressure. This is when the force of your blood pressing against the walls of your arteries is too strong. Arteries are blood vessels that carry blood from your heart throughout your body. Hypertension forces the heart to work harder to pump blood, and may cause the arteries to become narrow or stiff. Having untreated or uncontrolled hypertension can cause heart attack, stroke, kidney disease, and other problems. What are blood pressure readings? A blood pressure reading consists of a higher number over a lower number. Ideally, your blood pressure should be below 120/80. The first ("top") number is called the systolic pressure. It is a measure of the pressure in your arteries as your heart beats. The second ("bottom") number is called the diastolic pressure. It is a measure of the pressure in your arteries as the heart relaxes. What does my blood pressure reading mean? Blood pressure is classified into four stages. Based on your blood pressure reading, your health care provider may use the following stages to determine what type of treatment you need, if any. Systolic pressure and diastolic pressure are measured in a unit called mm Hg. Normal  Systolic pressure: below 120.  Diastolic pressure: below 80. Elevated  Systolic pressure: 120-129.  Diastolic pressure: below 80. Hypertension stage 1  Systolic pressure: 130-139.  Diastolic pressure: 80-89. Hypertension stage 2  Systolic pressure: 140 or above.  Diastolic pressure: 90 or above. What health risks are associated with hypertension? Managing your hypertension is an important responsibility. Uncontrolled hypertension can lead to:  A heart attack.  A stroke.  A weakened blood vessel (aneurysm).  Heart failure.  Kidney damage.  Eye damage.  Metabolic syndrome.  Memory and concentration problems.  What changes can I make to manage my  hypertension? Hypertension can be managed by making lifestyle changes and possibly by taking medicines. Your health care provider will help you make a plan to bring your blood pressure within a normal range. Eating and drinking  Eat a diet that is high in fiber and potassium, and low in salt (sodium), added sugar, and fat. An example eating plan is called the DASH (Dietary Approaches to Stop Hypertension) diet. To eat this way: ? Eat plenty of fresh fruits and vegetables. Try to fill half of your plate at each meal with fruits and vegetables. ? Eat whole grains, such as whole wheat pasta, brown rice, or whole grain bread. Fill about one quarter of your plate with whole grains. ? Eat low-fat diary products. ? Avoid fatty cuts of meat, processed or cured meats, and poultry with skin. Fill about one quarter of your plate with lean proteins such as fish, chicken without skin, beans, eggs, and tofu. ? Avoid premade and processed foods. These tend to be higher in sodium, added sugar, and fat.  Reduce your daily sodium intake. Most people with hypertension should eat less than 1,500 mg of sodium a day.  Limit alcohol intake to no more than 1 drink a day for nonpregnant women and 2 drinks a day for men. One drink equals 12 oz of beer, 5 oz of wine, or 1 oz of hard liquor. Lifestyle  Work with your health care provider to maintain a healthy body weight, or to lose weight. Ask what an ideal weight is for you.  Get at least 30 minutes of exercise that causes your heart to beat faster (aerobic exercise) most days of the week. Activities may include walking, swimming, or biking.  Include exercise   to strengthen your muscles (resistance exercise), such as weight lifting, as part of your weekly exercise routine. Try to do these types of exercises for 30 minutes at least 3 days a week.  Do not use any products that contain nicotine or tobacco, such as cigarettes and e-cigarettes. If you need help quitting, ask  your health care provider.  Control any long-term (chronic) conditions you have, such as high cholesterol or diabetes. Monitoring  Monitor your blood pressure at home as told by your health care provider. Your personal target blood pressure may vary depending on your medical conditions, your age, and other factors.  Have your blood pressure checked regularly, as often as told by your health care provider. Working with your health care provider  Review all the medicines you take with your health care provider because there may be side effects or interactions.  Talk with your health care provider about your diet, exercise habits, and other lifestyle factors that may be contributing to hypertension.  Visit your health care provider regularly. Your health care provider can help you create and adjust your plan for managing hypertension. Will I need medicine to control my blood pressure? Your health care provider may prescribe medicine if lifestyle changes are not enough to get your blood pressure under control, and if:  Your systolic blood pressure is 130 or higher.  Your diastolic blood pressure is 80 or higher.  Take medicines only as told by your health care provider. Follow the directions carefully. Blood pressure medicines must be taken as prescribed. The medicine does not work as well when you skip doses. Skipping doses also puts you at risk for problems. Contact a health care provider if:  You think you are having a reaction to medicines you have taken.  You have repeated (recurrent) headaches.  You feel dizzy.  You have swelling in your ankles.  You have trouble with your vision. Get help right away if:  You develop a severe headache or confusion.  You have unusual weakness or numbness, or you feel faint.  You have severe pain in your chest or abdomen.  You vomit repeatedly.  You have trouble breathing. Summary  Hypertension is when the force of blood pumping through  your arteries is too strong. If this condition is not controlled, it may put you at risk for serious complications.  Your personal target blood pressure may vary depending on your medical conditions, your age, and other factors. For most people, a normal blood pressure is less than 120/80.  Hypertension is managed by lifestyle changes, medicines, or both. Lifestyle changes include weight loss, eating a healthy, low-sodium diet, exercising more, and limiting alcohol. This information is not intended to replace advice given to you by your health care provider. Make sure you discuss any questions you have with your health care provider. Document Released: 06/04/2012 Document Revised: 08/08/2016 Document Reviewed: 08/08/2016 Elsevier Interactive Patient Education  2018 [employer] Hypertension Hypertension, commonly called high blood pressure, is when the force of blood pumping through the arteries is too strong. The arteries are the blood vessels that carry blood from the heart throughout the body. Hypertension forces the heart to work harder to pump blood and may cause arteries to become narrow or stiff. Having untreated or uncontrolled hypertension can cause heart attacks, strokes, kidney disease, and other problems. A blood pressure reading consists of a higher number over a lower number. Ideally, your blood pressure should be below 120/80. The first ("top") number is called the systolic   pressure. It is a measure of the pressure in your arteries as your heart beats. The second ("bottom") number is called the diastolic pressure. It is a measure of the pressure in your arteries as the heart relaxes. What are the causes? The cause of this condition is not known. What increases the risk? Some risk factors for high blood pressure are under your control. Others are not. Factors you can change  Smoking.  Having type 2 diabetes mellitus, high cholesterol, or both.  Not getting enough exercise or  physical activity.  Being overweight.  Having too much fat, sugar, calories, or salt (sodium) in your diet.  Drinking too much alcohol. Factors that are difficult or impossible to change  Having chronic kidney disease.  Having a family history of high blood pressure.  Age. Risk increases with age.  Race. You may be at higher risk if you are African-American.  Gender. Men are at higher risk than women before age 45. After age 65, women are at higher risk than men.  Having obstructive sleep apnea.  Stress. What are the signs or symptoms? Extremely high blood pressure (hypertensive crisis) may cause:  Headache.  Anxiety.  Shortness of breath.  Nosebleed.  Nausea and vomiting.  Severe chest pain.  Jerky movements you cannot control (seizures).  How is this diagnosed? This condition is diagnosed by measuring your blood pressure while you are seated, with your arm resting on a surface. The cuff of the blood pressure monitor will be placed directly against the skin of your upper arm at the level of your heart. It should be measured at least twice using the same arm. Certain conditions can cause a difference in blood pressure between your right and left arms. Certain factors can cause blood pressure readings to be lower or higher than normal (elevated) for a short period of time:  When your blood pressure is higher when you are in a health care provider's office than when you are at home, this is called white coat hypertension. Most people with this condition do not need medicines.  When your blood pressure is higher at home than when you are in a health care provider's office, this is called masked hypertension. Most people with this condition may need medicines to control blood pressure.  If you have a high blood pressure reading during one visit or you have normal blood pressure with other risk factors:  You may be asked to return on a different day to have your blood  pressure checked again.  You may be asked to monitor your blood pressure at home for 1 week or longer.  If you are diagnosed with hypertension, you may have other blood or imaging tests to help your health care provider understand your overall risk for other conditions. How is this treated? This condition is treated by making healthy lifestyle changes, such as eating healthy foods, exercising more, and reducing your alcohol intake. Your health care provider may prescribe medicine if lifestyle changes are not enough to get your blood pressure under control, and if:  Your systolic blood pressure is above 130.  Your diastolic blood pressure is above 80.  Your personal target blood pressure may vary depending on your medical conditions, your age, and other factors. Follow these instructions at home: Eating and drinking  Eat a diet that is high in fiber and potassium, and low in sodium, added sugar, and fat. An example eating plan is called the DASH (Dietary Approaches to Stop Hypertension) diet. To   eat this way: ? Eat plenty of fresh fruits and vegetables. Try to fill half of your plate at each meal with fruits and vegetables. ? Eat whole grains, such as whole wheat pasta, brown rice, or whole grain bread. Fill about one quarter of your plate with whole grains. ? Eat or drink low-fat dairy products, such as skim milk or low-fat yogurt. ? Avoid fatty cuts of meat, processed or cured meats, and poultry with skin. Fill about one quarter of your plate with lean proteins, such as fish, chicken without skin, beans, eggs, and tofu. ? Avoid premade and processed foods. These tend to be higher in sodium, added sugar, and fat.  Reduce your daily sodium intake. Most people with hypertension should eat less than 1,500 mg of sodium a day.  Limit alcohol intake to no more than 1 drink a day for nonpregnant women and 2 drinks a day for men. One drink equals 12 oz of beer, 5 oz of wine, or 1 oz of hard  liquor. Lifestyle  Work with your health care provider to maintain a healthy body weight or to lose weight. Ask what an ideal weight is for you.  Get at least 30 minutes of exercise that causes your heart to beat faster (aerobic exercise) most days of the week. Activities may include walking, swimming, or biking.  Include exercise to strengthen your muscles (resistance exercise), such as pilates or lifting weights, as part of your weekly exercise routine. Try to do these types of exercises for 30 minutes at least 3 days a week.  Do not use any products that contain nicotine or tobacco, such as cigarettes and e-cigarettes. If you need help quitting, ask your health care provider.  Monitor your blood pressure at home as told by your health care provider.  Keep all follow-up visits as told by your health care provider. This is important. Medicines  Take over-the-counter and prescription medicines only as told by your health care provider. Follow directions carefully. Blood pressure medicines must be taken as prescribed.  Do not skip doses of blood pressure medicine. Doing this puts you at risk for problems and can make the medicine less effective.  Ask your health care provider about side effects or reactions to medicines that you should watch for. Contact a health care provider if:  You think you are having a reaction to a medicine you are taking.  You have headaches that keep coming back (recurring).  You feel dizzy.  You have swelling in your ankles.  You have trouble with your vision. Get help right away if:  You develop a severe headache or confusion.  You have unusual weakness or numbness.  You feel faint.  You have severe pain in your chest or abdomen.  You vomit repeatedly.  You have trouble breathing. Summary  Hypertension is when the force of blood pumping through your arteries is too strong. If this condition is not controlled, it may put you at risk for serious  complications.  Your personal target blood pressure may vary depending on your medical conditions, your age, and other factors. For most people, a normal blood pressure is less than 120/80.  Hypertension is treated with lifestyle changes, medicines, or a combination of both. Lifestyle changes include weight loss, eating a healthy, low-sodium diet, exercising more, and limiting alcohol. This information is not intended to replace advice given to you by your health care provider. Make sure you discuss any questions you have with your health care provider. Document   Released: 09/10/2005 Document Revised: 08/08/2016 Document Reviewed: 08/08/2016 Elsevier Interactive Patient Education  2018 Elsevier Inc.  

## 2017-05-07 NOTE — Progress Notes (Signed)
   Subjective:    Patient ID: Scott Pruitt, male    DOB: 03/27/1961, 56 y.o.   MRN: [id number]  HPI    Review of Systems  Musculoskeletal: Positive for arthralgias.       Objective:   Physical Exam        Assessment & Plan:

## 2017-05-08 LAB — LIPID PANEL
Chol/HDL Ratio: 3.5 ratio (ref 0.0–5.0)
Cholesterol, Total: 157 mg/dL (ref 100–199)
HDL: 45 mg/dL (ref >39)
LDL Calculated: 94 mg/dL (ref 0–99)
Triglycerides: 88 mg/dL (ref 0–149)
VLDL Cholesterol Cal: 18 mg/dL (ref 5–40)

## 2017-05-08 LAB — CBC
Hematocrit: 47.2 % (ref 37.5–51.0)
Hemoglobin: 15.6 g/dL (ref 13.0–17.7)
MCH: 30.5 pg (ref 26.6–33.0)
MCHC: 33.1 g/dL (ref 31.5–35.7)
MCV: 92 fL (ref 79–97)
Platelets: 243 10*3/uL (ref 150–379)
RBC: 5.12 x10E6/uL (ref 4.14–5.80)
RDW: 14.5 % (ref 12.3–15.4)
WBC: 6.5 10*3/uL (ref 3.4–10.8)

## 2017-05-08 LAB — COMPREHENSIVE METABOLIC PANEL
ALT: 25 IU/L (ref 0–44)
AST: 24 IU/L (ref 0–40)
Albumin/Globulin Ratio: 2.1 (ref 1.2–2.2)
Albumin: 4.7 g/dL (ref 3.5–5.5)
Alkaline Phosphatase: 57 IU/L (ref 39–117)
BUN/Creatinine Ratio: 10 (ref 9–20)
BUN: 12 mg/dL (ref 6–24)
Bilirubin Total: 0.3 mg/dL (ref 0.0–1.2)
CO2: 26 mmol/L (ref 20–29)
Calcium: 9.8 mg/dL (ref 8.7–10.2)
Chloride: 104 mmol/L (ref 96–106)
Creatinine, Ser: 1.2 mg/dL (ref 0.76–1.27)
GFR calc Af Amer: 77 mL/min/{1.73_m2} (ref >59)
GFR calc non Af Amer: 67 mL/min/{1.73_m2} (ref >59)
Globulin, Total: 2.2 g/dL (ref 1.5–4.5)
Glucose: 98 mg/dL (ref 65–99)
Potassium: 4.6 mmol/L (ref 3.5–5.2)
Sodium: 144 mmol/L (ref 134–144)
Total Protein: 6.9 g/dL (ref 6.0–8.5)

## 2017-05-08 LAB — PSA: Prostate Specific Ag, Serum: 0.9 ng/mL (ref 0.0–4.0)

## 2017-05-08 LAB — TSH: TSH: 2.16 u[IU]/mL (ref 0.450–4.500)

## 2017-05-10 ENCOUNTER — Encounter: Payer: Self-pay | Admitting: Family Medicine

## 2017-05-10 NOTE — Progress Notes (Signed)
Subjective:    Patient ID: Scott Pruitt, male    DOB: October 22, 1960, 56 y.o.   MRN: [id number] Chief Complaint  Patient presents with  . Annual Exam    HPI  Scott Pruitt is a delightful 56 yo male here today for his CPE - I last saw him approx 1.5 yrs prior for the same.   His fiance is unexpectedly pregnant which has been really stressful - she is due to a girl on 08/23/2017. He has a teenage son and she has 3 girls 61-12 yo so this is very financially strapping - not what he was counting on for his late 34s.  Primary Preventative Screenings: Prostate Cancer:  Lab Results  Component Value Date   PSA 1.15 12/29/2015   PSA 1.39 12/03/2014   STI screening: Neg HIV & Hep C 12/2015 Colorectal Cancer: Colonoscopy from [facility] 2012 or 2013 - doesn't remember result H/o tobacco use AAA/Lung: No h/o tob use Cardiac: EKG: 11/2014  Weight/blood sugar: BMI nml Lab Results  Component Value Date   HGBA1C 5.1 10/12/2014   OTC/vit/supp/herbal: asa 81, prostate supp,  Diet/Exercise: Working on tlc - plays close attention to nutrition labels, walks for exercise 3-4x/wk but less recently - does track steps on phone to get 5000 steps/d Dentist/Optho: sees both regularly. Immunizations: does not get flu shot but is going to reconsider this yr. Immunization History  Administered Date(s) Administered  . Tdap 12/03/2014    Chronic Medical Conditions: HTN: started on lisinopril-hctz 10-12.5 2016.  Took last pills yesterday and was out for a few days last week. Checks BP outside of office - usu runs good but has been increasing recently more to 140s/80s-90s. No side effects.  HLD: started pravastatin 40 2016 as ASCVD risk was 10.1% with LDL 153 with excellent response.    Lab Results  Component Value Date   LDLCALC 88 12/29/2015   LDLCALC 85 06/02/2015   LDLCALC 153 (H) 10/12/2014   1 mo prior developed left tennis elbow sxs - wakes with stff elbow when waking up an dsleeping on his arm.  Went to  [facility] when his pain moved up to h is left shoulder and he was treated with a prednisone taper which relieved shoulder pain but lateral elbow pain continued.  Tried some bengay w/o sig relief.  Lateral elbow pain was only improved for 1d on the steroid pack.     Past Medical History:  Diagnosis Date  . Hyperlipidemia   . Hypertension    Past Surgical History:  Procedure Laterality Date  . FRACTURE SURGERY     Current Outpatient Prescriptions on File Prior to Visit  Medication Sig Dispense Refill  . aspirin 81 MG tablet Take 81 mg by mouth daily.    Marland Kitchen Specialty Vitamins Products (PROSTATE PO) Take by mouth.     No current facility-administered medications on file prior to visit.    No Known Allergies Family History  Problem Relation Age of Onset  . Stroke Father   . Hypertension Sister    Social History   Social History  . Marital status: Legally Separated    Spouse name: N/A  . Number of children: N/A  . Years of education: N/A   Social History Main Topics  . Smoking status: Never Smoker  . Smokeless tobacco: Never Used  . Alcohol use 0.0 oz/week  . Drug use: No  . Sexual activity: No   Other Topics Concern  . None   Social History Narrative  Divorced; 76 year old son (2018)   2018 engaged to 37 yo woman who has 3 girls - 55, 97, 56 yo and they are expecting a baby girl 08/23/2017 (not planned)   Salesperson; drives a lot to locations  His fiance is 58 yo and she is 74, 34, 57 yo and she is pregnant with a girl and due on 11/30  Depression screen PHQ 2/9 05/07/2017 12/29/2015 06/02/2015 04/17/2015 12/03/2014  Decreased Interest 0 0 0 0 0  Down, Depressed, Hopeless 0 0 0 0 0  PHQ - 2 Score 0 0 0 0 0    Review of Systems  Musculoskeletal: Positive for arthralgias.  Psychiatric/Behavioral: Positive for sleep disturbance.  All other systems reviewed and are negative.  left elbow pain   Objective:   Physical Exam  Constitutional: He is oriented to person, place,  and time. He appears well-developed and well-nourished. No distress.  HENT:  Head: Normocephalic and atraumatic.  Right Ear: Tympanic membrane, external ear and ear canal normal.  Left Ear: Tympanic membrane, external ear and ear canal normal.  Nose: Nose normal.  Mouth/Throat: Uvula is midline, oropharynx is clear and moist and mucous membranes are normal. No oropharyngeal exudate.  Eyes: Conjunctivae are normal. Right eye exhibits no discharge. Left eye exhibits no discharge. No scleral icterus.  Neck: Normal range of motion. Neck supple. No thyromegaly present.  Cardiovascular: Normal rate, regular rhythm, normal heart sounds and intact distal pulses.   Pulmonary/Chest: Effort normal and breath sounds normal. No respiratory distress.  Abdominal: Soft. Bowel sounds are normal. He exhibits no distension and no mass. There is no tenderness. There is no rebound and no guarding.  Genitourinary: Rectum normal and prostate normal. Rectal exam shows no mass and anal tone normal. Prostate is not enlarged and not tender.  Musculoskeletal: He exhibits no edema.       Left elbow: Normal. He exhibits normal range of motion, no swelling and no effusion. No tenderness found. No radial head, no medial epicondyle, no lateral epicondyle and no olecranon process tenderness noted.  Lymphadenopathy:    He has no cervical adenopathy.  Neurological: He is alert and oriented to person, place, and time. He has normal reflexes. No cranial nerve deficit. He exhibits normal muscle tone.  Skin: Skin is warm and dry. No rash noted. He is not diaphoretic. No erythema.  Psychiatric: He has a normal mood and affect. His behavior is normal.    BP (!) 164/98   Pulse 71   Temp (!) 97.5 F (36.4 C) (Oral)   Resp 18   Ht 5\' 9"  (1.753 m)   Wt 164 lb 12.8 oz (74.8 kg)   SpO2 97%   BMI 24.34 kg/m     Dg Elbow Complete Left (3+view)  Result Date: 05/07/2017 CLINICAL DATA:  Persistent pain adjacent to the lateral  epicondyles for over 1 month. No known injury. EXAM: LEFT ELBOW - COMPLETE 3+ VIEW COMPARISON:  None. FINDINGS: There is no evidence of fracture, dislocation, or joint effusion. There is no evidence of arthropathy or other focal bone abnormality. Soft tissues are unremarkable. IMPRESSION: Negative. Electronically Signed   By: Francene Boyers M.D.   On: 05/07/2017 09:48    Assessment & Plan:  Call [facility] to get copy of last colonoscopy for chart Ua, cbc, cmp, lipid, tsh, psa  1. Annual physical exam   2. Routine screening for STI (sexually transmitted infection)   3. Screening for cardiovascular, respiratory, and genitourinary diseases   4. Screening  for colorectal cancer   5. Screening for deficiency anemia   6. Screening for prostate cancer   7. Screening for thyroid disorder   8. Essential hypertension, benign - increase lisinopril-hctz from 10-12.5 to 20-25. Recheck in 2-3 mos.  9. Pure hypercholesterolemia - cont pravastatin 40  10. Left elbow pain - exam and xray nml. Pain ABOVE lateral epicondyle and not induced by any palpation or strength testing. Try to stop compression w/ sleeping on it o/n, start qd meloxicam, ice. If cont - recheck with sports med.    Orders Placed This Encounter  Procedures  . DG ELBOW COMPLETE LEFT (3+VIEW)    Standing Status:   Future    Number of Occurrences:   1    Standing Expiration Date:   05/07/2018    Order Specific Question:   Reason for Exam (SYMPTOM  OR DIAGNOSIS REQUIRED)    Answer:   persistent pain proximal to left lateral epicondyle for > 1 mo unresponsive to prednisone    Order Specific Question:   Preferred imaging location?    Answer:   External  . CBC  . Comprehensive metabolic panel    Order Specific Question:   Has the patient fasted?    Answer:   Yes  . Lipid panel    Order Specific Question:   Has the patient fasted?    Answer:   Yes  . TSH  . PSA  . Care order/instruction:    Scheduling Instructions:     Recheck BP  . POCT  urinalysis dipstick    Meds ordered this encounter  Medications  . pravastatin (PRAVACHOL) 40 MG tablet    Sig: Take 1 tablet (40 mg total) by mouth at bedtime.    Dispense:  90 tablet    Refill:  3  . lisinopril-hydrochlorothiazide (PRINZIDE,ZESTORETIC) 20-25 MG tablet    Sig: Take 1 tablet by mouth daily.    Dispense:  90 tablet    Refill:  3  . meloxicam (MOBIC) 15 MG tablet    Sig: Take 1 tablet (15 mg total) by mouth daily.    Dispense:  30 tablet    Refill:  0     Norberto Sorenson, M.D.  Primary Care at [facility] [address] [place], [place] [address] [phone] phone [phone] fax  05/10/17 2:44 AM

## 2017-05-15 ENCOUNTER — Telehealth: Payer: Self-pay | Admitting: Family Medicine

## 2017-05-15 DIAGNOSIS — M25522 Pain in left elbow: Secondary | ICD-10-CM

## 2017-05-15 NOTE — Telephone Encounter (Signed)
Patient stated that he told Dr Clelia Croft that he was having trouble sleeping because of the pain in his elbow but that Dr Clelia Croft only gave him the antiinflammatory meds and he doesn't believe they are working. He stated after he mentioned this to Dr Clelia Croft she did not talk about it anymore and did not give him anything he wants to know if something can be sent in for him.  His call back number is [phone]

## 2017-05-15 NOTE — Telephone Encounter (Signed)
Here is the message that pt was given when we called him about his xray results.  "He should stay on the meloxicam for a month and try not to sleep on that arm at all - avoid nerve compression and then I will refer him to orthopedics - he should schedule that initial intake for a month so he can report to them how he has been doing with the trial of anti-inflammatories and avoiding compression."  As this was brought up at pt's full physical, I did not have a lot of time to talk to him in the office in detail as it is an issue which could use a full visit time to fully assess/address but at this point I think that that would be better done by orthopedics since I ruled out the more common typical causes.    He needs to make sure he is NOT sleeping on his elbow and he needs to stay on the anti-inflammatories even if he doesn't think they are working - his insurance isn't going to pay for a nerve study or mri or whatever is determined is needed unless he has stayed on the anti-inflammatories for 4-6 wks. I worry if that I put him on a sleeping medicine, that he will roll over onto his elbow.  If he wants to try a different anti-inflammatory or a slightly stronger pain medicine before bed like tramadol in addition to the meloxicam, we can.

## 2017-05-17 ENCOUNTER — Encounter (INDEPENDENT_AMBULATORY_CARE_PROVIDER_SITE_OTHER): Payer: Self-pay | Admitting: Orthopedic Surgery

## 2017-05-17 ENCOUNTER — Ambulatory Visit (INDEPENDENT_AMBULATORY_CARE_PROVIDER_SITE_OTHER): Payer: 59

## 2017-05-17 ENCOUNTER — Ambulatory Visit (INDEPENDENT_AMBULATORY_CARE_PROVIDER_SITE_OTHER): Payer: 59 | Admitting: Orthopedic Surgery

## 2017-05-17 DIAGNOSIS — M542 Cervicalgia: Secondary | ICD-10-CM

## 2017-05-17 MED ORDER — TRAMADOL HCL 50 MG PO TABS: 50.0000 mg | ORAL_TABLET | Freq: Every evening | ORAL | 0 refills | Status: DC | PRN

## 2017-05-17 NOTE — Progress Notes (Signed)
Office Visit Note   Patient: Scott Pruitt           Date of Birth: [birth year]           MRN: [id number] Visit Date: 05/17/2017 Requested by: Sherren Mocha, MD [address] [place], [place] [address] PCP: Sherren Mocha, MD  Subjective: Chief Complaint  Patient presents with  . Left Elbow - Pain    HPI: Scott Pruitt is a 56 year old right-hand-dominant patient with left elbow pain.  Been going on for 2 months.  Started when he was twisting his neck and he developed pain from the neck down to digits 4 and 5 and radiating pattern.  He took a Medrol Dosepak and that improved but now localizes to the lateral elbow.  He denies any history of injury and doesn't really report any pain with gripping.  He is currently taking meloxicam which helps some.  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  Pain is worse at night.  Denies any weakness in the arm.  His pain is not 24 7 but it does hurt more than it does not hurt and it does hurt on a daily basis.  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              ROS: All systems reviewed are negative as they relate to the chief complaint within the history of present illness.  Patient denies  fevers or chills.   Assessment & Plan: Visit Diagnoses:  1. Neck pain     Plan: Impression is lateral elbow pain which does not appear clinically or historically to be tennis elbow.  This does appear to be likely related to early radiculopathy in the neck partially treated with medication.  He continues to have pain which affects his sleep.  I want to try him on tramadol and order MRI scan of the neck with likely epidural steroid injections to follow.  Examination completely benign for tennis elbow.  Follow-Up Instructions: No Follow-up on file.   Orders:  Orders Placed This Encounter  Procedures  . XR Cervical Spine 2 or 3 views  . MR Cervical Spine w/o contrast   Meds ordered this encounter  Medications  . traMADol  (ULTRAM) 50 MG tablet    Sig: Take 1 tablet (50 mg total) by mouth at bedtime as needed.    Dispense:  45 tablet    Refill:  0      Procedures: No procedures performed   Clinical Data: No additional findings.  Objective: Vital Signs: There were no vitals taken for this visit.  Physical Exam:   Constitutional: Patient appears well-developed HEENT:  Head: Normocephalic Eyes:EOM are abnormal and chronically abnormal l Neck: Normal range of motion Cardiovascular: Normal rate Pulmonary/chest: Effort normal Neurologic: Patient is alert Skin: Skin is warm Psychiatric: Patient has normal mood and affect    Ortho Exam: Orthopedic exam demonstrates good cervical spine range of motion 5 out of 5 grip EPL FPL interosseous wrist flexion and wrist extension biceps triceps and deltoid strength.  Radial pulses palpable bilaterally.  No other masses lymph adenopathy or skin changes noted in the neck or arm region.  He has no tenderness to palpation over the lateral epicondyle.  No pain with gripping on the left-hand side no pain with resisted finger extension or wrist extension on that left lateral condyle region.  No tenderness in the radial tunnel.  Specialty Comments:  No specialty  comments available.  Imaging: Xr Cervical Spine 2 Or 3 Views  Result Date: 05/17/2017 AP lateral cervical spine reviewed.  Normal alignment present.  No facet arthritis or degenerative disc disease is present.  Disc space is well-maintained.  No malalignment.  Bone quality looks good    PMFS History: Patient Active Problem List   Diagnosis Date Noted  . Essential hypertension, benign 01/22/2016  . Hyperlipidemia 01/22/2016  . Right-sided low back pain without sciatica 04/17/2015   Past Medical History:  Diagnosis Date  . Hyperlipidemia   . Hypertension     Family History  Problem Relation Age of Onset  . Stroke Father   . Hypertension Sister     Past Surgical History:  Procedure Laterality  Date  . FRACTURE SURGERY     Social History   Occupational History  . Not on file.   Social History Main Topics  . Smoking status: Never Smoker  . Smokeless tobacco: Never Used  . Alcohol use 0.0 oz/week  . Drug use: No  . Sexual activity: No

## 2017-05-20 NOTE — Telephone Encounter (Signed)
Spoke with pt this morning about Dr. Clelia Croft advise and he informed me he seen the Ortho doctor on Friday and was given Tramadol Rx for the pain.  He also informed me that he will be scheduled for an MRI sometime this week. I informed the pt that if he has anymore questions or concerns to give use a call back at the office, he verbalized understanding.

## 2017-05-28 ENCOUNTER — Telehealth (INDEPENDENT_AMBULATORY_CARE_PROVIDER_SITE_OTHER): Payer: Self-pay | Admitting: Orthopedic Surgery

## 2017-05-28 NOTE — Telephone Encounter (Signed)
Was written 05/17/17 #45 to take 1 at bedtime prn pain.

## 2017-05-28 NOTE — Telephone Encounter (Signed)
Ok to rf? 

## 2017-05-28 NOTE — Telephone Encounter (Signed)
When was last prescription

## 2017-05-28 NOTE — Telephone Encounter (Signed)
Patient called needing Rx refilled (Tramadol) The number to contact patient is [phone]

## 2017-05-28 NOTE — Telephone Encounter (Signed)
oKay to refill that medication 45 days from 05/17/2017

## 2017-05-29 MED ORDER — ACETAMINOPHEN-CODEINE #3 300-30 MG PO TABS: 1.0000 | ORAL_TABLET | Freq: Three times a day (TID) | ORAL | 0 refills | Status: DC | PRN

## 2017-05-29 NOTE — Telephone Encounter (Signed)
IC s/w patient. Rx called to pharmacy. Patient advised done.

## 2017-05-29 NOTE — Telephone Encounter (Signed)
I s/w patient he stated pharmacy only gave him about 5-6 tablets. He stated that it really wasn't helping his pain that he was taking tylenol pm with the ultram just to be able to get to sleep. Said that at night pain is constant and he can't get comfortable. Wanted to know if something else could be prescribed for him.

## 2017-05-29 NOTE — Telephone Encounter (Signed)
Ok for t 3 1 po q 8 # 30 until mri cspine pls clal thx

## 2017-05-31 ENCOUNTER — Ambulatory Visit
Admission: RE | Admit: 2017-05-31 | Discharge: 2017-05-31 | Disposition: A | Discharge status: Home / Self Care | Payer: 59 | Source: Ambulatory Visit | Attending: Orthopedic Surgery | Admitting: Orthopedic Surgery

## 2017-05-31 ENCOUNTER — Inpatient Hospital Stay: Admission: RE | Admit: 2017-05-31 | Payer: Self-pay | Source: Ambulatory Visit

## 2017-05-31 DIAGNOSIS — M542 Cervicalgia: Secondary | ICD-10-CM

## 2017-05-31 DIAGNOSIS — M4802 Spinal stenosis, cervical region: Secondary | ICD-10-CM | POA: Diagnosis not present

## 2017-06-03 ENCOUNTER — Ambulatory Visit: Payer: 59 | Admitting: Family Medicine

## 2017-06-03 ENCOUNTER — Other Ambulatory Visit: Payer: Self-pay | Admitting: Family Medicine

## 2017-06-03 NOTE — Telephone Encounter (Signed)
Patient is suppose to come back this month for a f/u will refill at appointment if appropriate.

## 2017-06-04 NOTE — Telephone Encounter (Signed)
PT DIDN'T WANT A REFILL ON THIS MEDICINE HE HAS AN APPOINTMENT TO SEE A SPECIALIST

## 2017-06-05 ENCOUNTER — Ambulatory Visit (INDEPENDENT_AMBULATORY_CARE_PROVIDER_SITE_OTHER): Payer: 59 | Admitting: Orthopedic Surgery

## 2017-06-05 DIAGNOSIS — M542 Cervicalgia: Secondary | ICD-10-CM

## 2017-06-05 MED ORDER — IBUPROFEN-FAMOTIDINE 800-26.6 MG PO TABS: 1.0000 | ORAL_TABLET | Freq: Every day | ORAL | 0 refills | Status: DC

## 2017-06-08 ENCOUNTER — Encounter (INDEPENDENT_AMBULATORY_CARE_PROVIDER_SITE_OTHER): Payer: Self-pay | Admitting: Orthopedic Surgery

## 2017-06-08 NOTE — Progress Notes (Signed)
Office Visit Note   Patient: Scott Pruitt           Date of Birth: 06-09-1961           MRN: [id number] Visit Date: 06/05/2017 Requested by: Sherren Mocha, MD [address] [place], [place] [address] PCP: Sherren Mocha, MD  Subjective: No chief complaint on file.   HPI: Criag is a patient with left elbow pain and some neck pain.  Since I have seen him he has had an MRI scan.  That scan is reviewed with the patient today.  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  No oblique and Tylenol 3 help him.  Arm hurts more than the neck.  Symptoms have been going on for 6 weeks.              ROS: All systems reviewed are negative as they relate to the chief complaint within the history of present illness.  Patient denies  fevers or chills.   Assessment & Plan: Visit Diagnoses: No diagnosis found.  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  We discussed injections today.  He wants to hold off on that intervention.  But he will call when he wants to get that done I can set him up to see Dr. Alvester Morin.  For now I want to get him Duexis samples plus a prescription.  Like him to take 1 tablet a day.  I'll see him back as needed  Follow-Up Instructions: Return if symptoms worsen or fail to improve.   Orders:  No orders of the defined types were placed in this encounter.  Meds ordered this encounter  Medications  . Ibuprofen-Famotidine (DUEXIS) 800-26.6 MG TABS    Sig: Take 1 tablet by mouth daily.    Dispense:  90 tablet    Refill:  0      Procedures: No procedures performed   Clinical Data: No additional findings.  Objective: Vital Signs: There were no vitals taken for this visit.  Physical Exam:   Constitutional: Patient appears well-developed HEENT:  Head: Normocephalic Eyes:EOM are normal Neck: Normal range of motion Cardiovascular: Normal rate Pulmonary/chest: Effort normal Neurologic: Patient is alert Skin:  Skin is warm Psychiatric: Patient has normal mood and affect    Ortho Exam: Orthopedic exam demonstrates good cervical spine range of motion.  No scapular dyskinesia.  Does have some proximal pain just above the metaphyseal flare of the distal humerus.  No real pain with resisted wrist extension or elbow extension.  Radial pulses intact laterally.  No muscle atrophy in the arms  Specialty Comments:  No specialty comments available.  Imaging: No results found.   PMFS History: Patient Active Problem List   Diagnosis Date Noted  . Essential hypertension, benign 01/22/2016  . Hyperlipidemia 01/22/2016  . Right-sided low back pain without sciatica 04/17/2015   Past Medical History:  Diagnosis Date  . Hyperlipidemia   . Hypertension     Family History  Problem Relation Age of Onset  . Stroke Father   . Hypertension Sister     Past Surgical History:  Procedure Laterality Date  . FRACTURE SURGERY     Social History   Occupational History  . Not on file.   Social History Main Topics  . Smoking status: Never Smoker  . Smokeless tobacco: Never Used  . Alcohol use 0.0 oz/week  . Drug use: No  . Sexual activity: No

## 2017-08-22 IMAGING — DX DG ELBOW COMPLETE 3+V*L*
4 series · 4 of 4 positions shown · non-contrast
Comparison: None.

CLINICAL DATA: Persistent pain adjacent to the lateral epicondyles
for over 1 month. No known injury.

EXAM:
LEFT ELBOW - COMPLETE 3+ VIEW

[elbow ap]
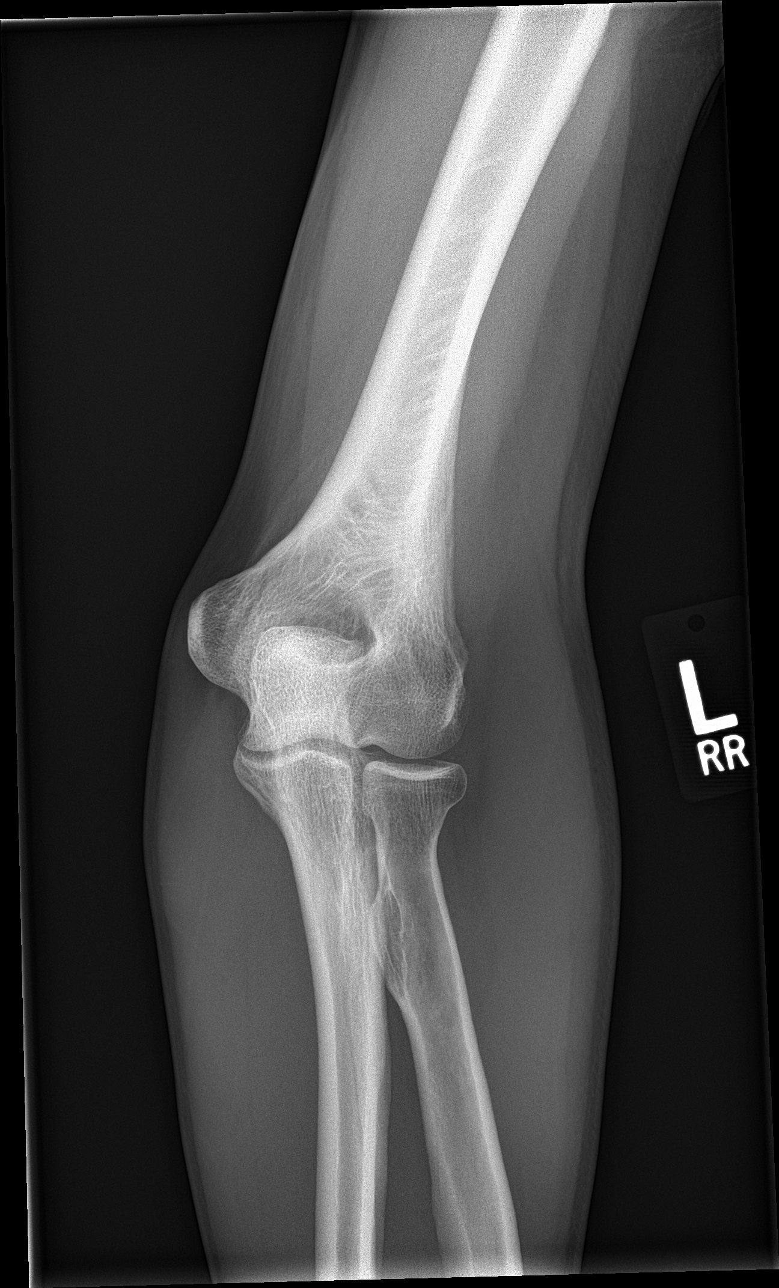

[elbow obl (1 of 2)]
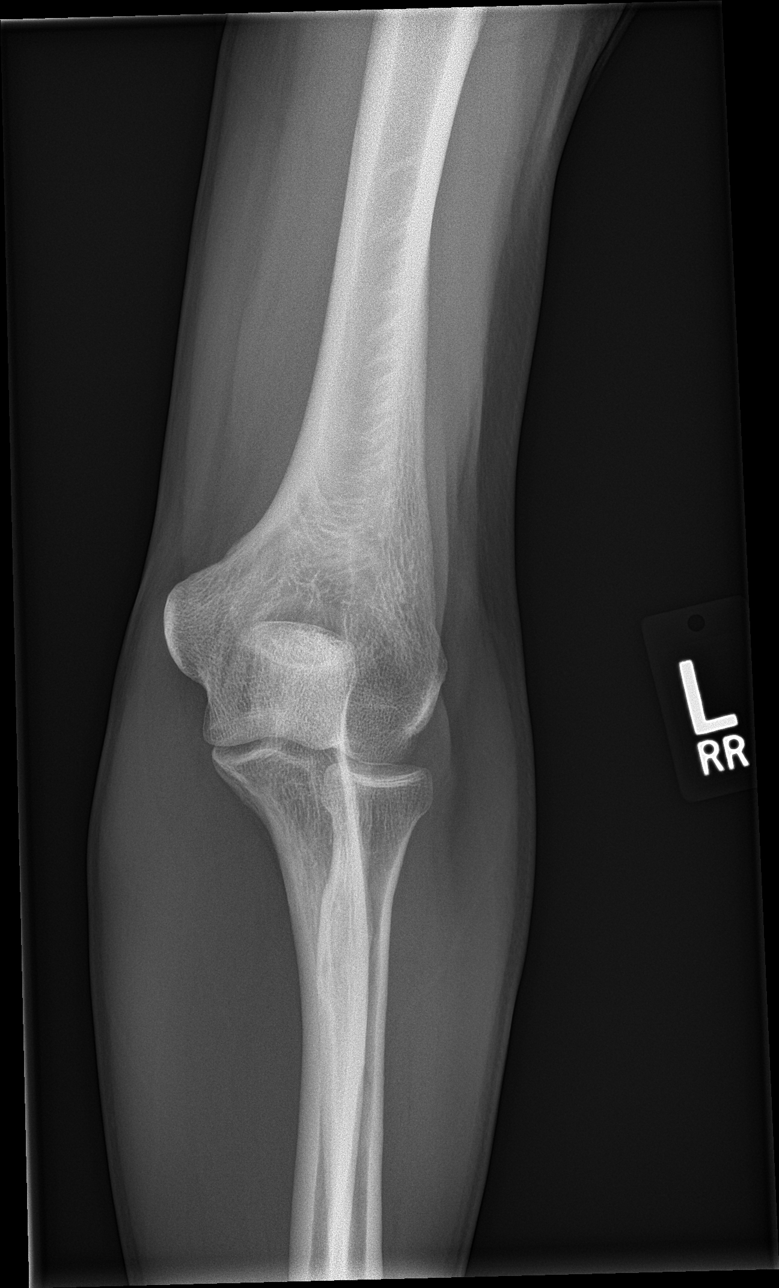

[elbow obl (2 of 2)]
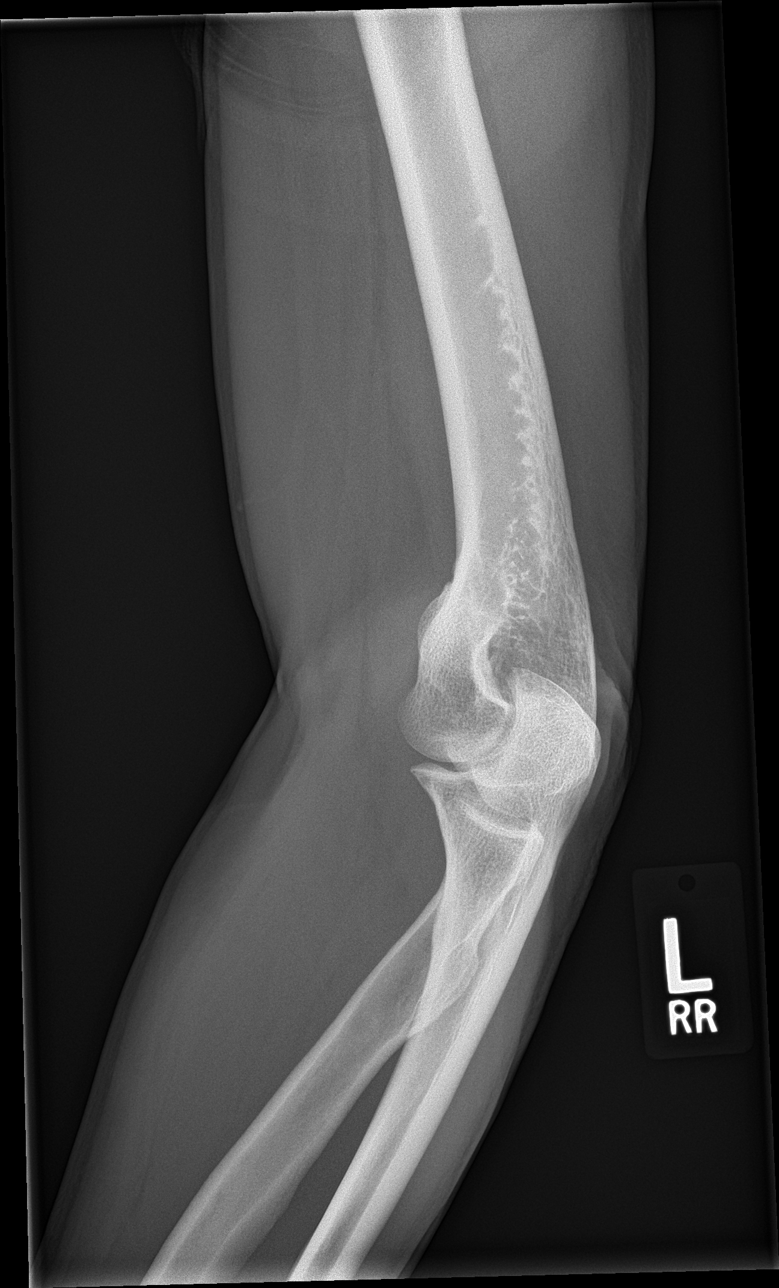

[elbow lat]
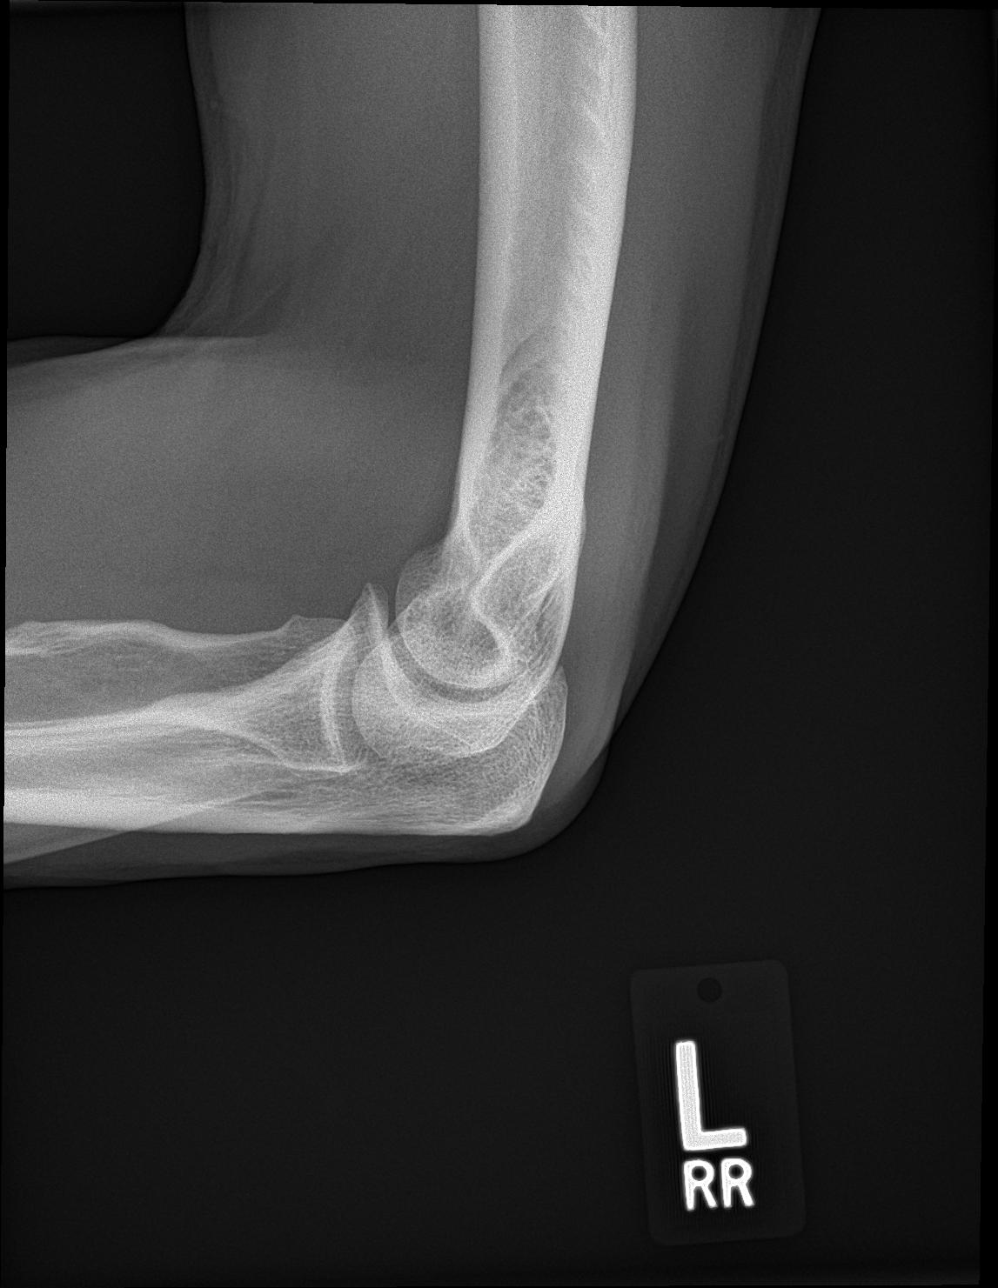

[4 of 4 positions shown; findings below may reference images not displayed]

FINDINGS: There is no evidence of fracture, dislocation, or joint effusion.
There is no evidence of arthropathy or other focal bone abnormality.
Soft tissues are unremarkable.
IMPRESSION: Negative.

## 2018-05-02 ENCOUNTER — Other Ambulatory Visit: Payer: Self-pay | Admitting: Family Medicine

## 2018-05-31 ENCOUNTER — Other Ambulatory Visit: Payer: Self-pay | Admitting: Family Medicine

## 2018-06-02 NOTE — Telephone Encounter (Signed)
Called and informed pt that appt was needed in order to continue to refill medication. Pt scheduled for appt on 06/04/18 at 10 am for med refill. 15 day supply sent in to pharmacy until appt.

## 2018-06-04 ENCOUNTER — Ambulatory Visit: Payer: Self-pay | Admitting: Physician Assistant

## 2018-06-15 ENCOUNTER — Other Ambulatory Visit: Payer: Self-pay | Admitting: Family Medicine

## 2018-06-16 NOTE — Telephone Encounter (Signed)
Pravastatin 40 mg  refill Last Refill:05/07/17 # 90 and 3 refills Last OV: 05/07/17 PCP: E. Clelia CroftShaw   Next appointment scheduled for 07/21/18 Pharmacy: [id number]  Prescription expired on 05/07/18 Last lipid panel was 05/07/17

## 2018-06-20 ENCOUNTER — Telehealth: Payer: Self-pay | Admitting: Family Medicine

## 2018-06-20 NOTE — Telephone Encounter (Signed)
Called pt to try and reschedule their appt on 10/28. Left VM for pt to call the office and reschedule with a provider of their choice if they are needing a med refill.  When pt calls back, please make an OV with a provider of their choice at their convenience.  Thank you!

## 2018-07-21 ENCOUNTER — Ambulatory Visit: Payer: Self-pay | Admitting: Family Medicine

## 2018-07-31 ENCOUNTER — Ambulatory Visit (INDEPENDENT_AMBULATORY_CARE_PROVIDER_SITE_OTHER): Payer: Commercial Managed Care - PPO

## 2018-07-31 ENCOUNTER — Encounter: Payer: Self-pay | Admitting: Physician Assistant

## 2018-07-31 ENCOUNTER — Ambulatory Visit (INDEPENDENT_AMBULATORY_CARE_PROVIDER_SITE_OTHER): Payer: Commercial Managed Care - PPO | Admitting: Physician Assistant

## 2018-07-31 VITALS — BP 145/87 | HR 66 | Temp 97.4°F | Resp 16 | Ht 69.0 in | Wt 158.0 lb

## 2018-07-31 DIAGNOSIS — M542 Cervicalgia: Secondary | ICD-10-CM | POA: Diagnosis not present

## 2018-07-31 DIAGNOSIS — I10 Essential (primary) hypertension: Secondary | ICD-10-CM | POA: Diagnosis not present

## 2018-07-31 DIAGNOSIS — M5412 Radiculopathy, cervical region: Secondary | ICD-10-CM

## 2018-07-31 DIAGNOSIS — M47812 Spondylosis without myelopathy or radiculopathy, cervical region: Secondary | ICD-10-CM | POA: Diagnosis not present

## 2018-07-31 MED ORDER — LISINOPRIL-HYDROCHLOROTHIAZIDE 20-25 MG PO TABS: ORAL_TABLET | ORAL | 0 refills | Status: DC

## 2018-07-31 MED ORDER — PREDNISONE 20 MG PO TABS: ORAL_TABLET | ORAL | 0 refills | Status: DC

## 2018-07-31 NOTE — Patient Instructions (Addendum)
  I have given you 30 tablets of blood pressure medication, will need to schedule an appointment with Dr. Clelia Croft within the next 5 weeks to have appropriate lab work done.   We are going to treat your underlying inflammation with oral prednisone. Prednisone is a steroid and can cause side effects such as headache, irritability, nausea, vomiting, increased heart rate, increased blood pressure, increased blood sugar, appetite changes, and insomnia. Please take tablets in the morning with a full meal to help decrease the chances of these side effects.   Follow up in no improvement in one week.   If you have lab work done today you will be contacted with your lab results within the next 2 weeks.  If you have not heard from [place] then please contact us. The fastest way to get your results is to register for My Chart.  Cervical Radiculopathy Cervical radiculopathy means that a nerve in the neck is pinched or bruised. This can cause pain or loss of feeling (numbness) that runs from your neck to your arm and fingers. Follow these instructions at home: Managing pain  Take over-the-counter and prescription medicines only as told by your doctor.  If directed, put ice on the injured or painful area. ? Put ice in a plastic bag. ? Place a towel between your skin and the bag. ? Leave the ice on for 20 minutes, 2-3 times per day.  If ice does not help, you can try using heat. Take a warm shower or warm bath, or use a heat pack as told by your doctor.  You may try a gentle neck and shoulder massage. Activity  Rest as needed. Follow instructions from your doctor about any activities to avoid.  Do exercises as told by your doctor or physical therapist. General instructions  If you were given a soft collar, wear it as told by your doctor.  Use a flat pillow when you sleep.  Keep all follow-up visits as told by your doctor. This is important. Contact a doctor if:  Your condition does not improve with  treatment. Get help right away if:  Your pain gets worse and is not controlled with medicine.  You lose feeling or feel weak in your hand, arm, face, or leg.  You have a fever.  You have a stiff neck.  You cannot control when you poop or pee (have incontinence).  You have trouble with walking, balance, or talking. This information is not intended to replace advice given to you by your health care provider. Make sure you discuss any questions you have with your health care provider. Document Released: 08/30/2011 Document Revised: 02/16/2016 Document Reviewed: 11/04/2014 Elsevier Interactive Patient Education  2018 [employer].   IF you received an x-ray today, you will receive an invoice from [facility]. Please contact [facility] at [phone] with questions or concerns regarding your invoice.   IF you received labwork today, you will receive an invoice from [facility]. Please contact [facility] at [phone] with questions or concerns regarding your invoice.   Our billing staff will not be able to assist you with questions regarding bills from these companies.  You will be contacted with the lab results as soon as they are available. The fastest way to get your results is to activate your My Chart account. Instructions are located on the last page of this paperwork. If you have not heard from [place] regarding the results in 2 weeks, please contact this office.

## 2018-07-31 NOTE — Progress Notes (Signed)
pred    MRN: [id number] DOB: [birth year]  Subjective:   Scott Pruitt is a 57 y.o. male who presents for evaluation of right arm pain. Event that precipitated these symptoms: none known. Onset of symptoms was >1 year ago. Has associated neck pain and elbow pain. Difficult to get comfortable. No acute injury. Feels nerve related. Has some numbness sensation. Denies weakness, dropping objects, redness, warmth, loss of ROM, fever, chills, N/V/D. Pt has PMH of cervical foraminal stenosis, last evaluated by ortho in 05/2017. They had discussed injections but he did not follow-up.  He was started on Duexis.  He has tried multiple NSAIDs and Tylenol with no full relief.  The only relief he can recall getting was from oral steroid.  Also mentions that he needs refills for blood pressure medication as he is out.  Was supposed to be scheduled for CPE but since Dr. Clelia Croft was out had to reschedule.  Plans on seeing her in the next month for CPE and fasting blood work. Denies lightheadedness, dizziness, chronic headache, double vision, chest pain, shortness of breath, heart racing, palpitations, nausea, vomiting, abdominal pain, hematuria, lower leg swelling.  Notes his blood pressures typically well controlled on blood pressure medication.  Syrus has a current medication list which includes the following prescription(s): acetaminophen-codeine, aspirin, ibuprofen-famotidine, lisinopril-hydrochlorothiazide, pravastatin, and specialty vitamins products. Also has No Known Allergies.  Amon  has a past medical history of Hyperlipidemia and Hypertension. Also  has a past surgical history that includes Fracture surgery.   Objective:   Vitals: BP (!) 145/87   Pulse 66   Temp (!) 97.4 F (36.3 C) (Oral)   Resp 16   Ht 5\' 9"  (1.753 m)   Wt 158 lb (71.7 kg)   SpO2 99%   BMI 23.33 kg/m   Physical Exam  Constitutional: He is oriented to person, place, and time. He appears well-developed and well-nourished. No  distress.  HENT:  Head: Normocephalic and atraumatic.  Eyes: Conjunctivae are normal.  Neck: Normal range of motion and full passive range of motion without pain. Muscular tenderness present. No spinous process tenderness present. No neck rigidity. No edema, no erythema and normal range of motion present.  Cardiovascular: Normal rate, regular rhythm, normal heart sounds and intact distal pulses.  Pulses:      Radial pulses are 2+ on the right side, and 2+ on the left side.  Pulmonary/Chest: Effort normal.  Musculoskeletal:       Right shoulder: He exhibits normal range of motion, no tenderness, no bony tenderness, no swelling, no spasm and normal strength.       Right elbow: Normal.He exhibits normal range of motion, no swelling and no effusion. No tenderness found.       Right wrist: Normal. He exhibits normal range of motion, no tenderness, no bony tenderness and no swelling.  Neurological: He is alert and oriented to person, place, and time.  Sensation of BUE intact. Strength of BUE 5/5. Positive Spurling's maneuver to the right.  Positive shoulder abduction relief test.  Skin: Skin is warm and dry.  Psychiatric: He has a normal mood and affect.  Vitals reviewed.   No results found for this or any previous visit (from the past 24 hour(s)).  No results found.  Assessment and Plan :  1. Neck pain - DG Cervical Spine Complete; Future  2. Cervical radiculopathy Plain film of cervical spine show stable degenerative changes at C6-7 level.  No improvement with over-the-counter medication.  Will attempt trial of  prednisone course at this time.  If no improvement, recommend follow-up with orthopedics for further evaluation and management.  Patient voices understanding. - predniSONE (DELTASONE) 20 MG tablet; Take 3 PO QAM x3days, 2 PO QAM x3days, 1 PO QAM x3days  Dispense: 18 tablet; Refill: 0  3. Essential hypertension, benign Courtesy refill provided. He is asymptomatic. Instructed to  check bp outside of office over the next couple of weeks. Return if consistently >140/90. Given strict ED precautions.  Plan to return within the next month for CPE with Dr. Clelia Croft.  Will obtain fasting labs at this time. - lisinopril-hydrochlorothiazide (PRINZIDE,ZESTORETIC) 20-25 MG tablet; TAKE 1 TABLET BY MOUTH EVERY DAY.  Dispense: 30 tablet; Refill: 0   Side effects, risks, benefits, and alternatives of the medications and treatment plan prescribed today were discussed, and patient expressed understanding of the instructions given. No barriers to understanding were identified. Red flags discussed in detail. Pt expressed understanding regarding what to do in case of emergency/urgent symptoms.  Note - This record has been created using [employer].  Chart creation errors have been sought, but may not always  have been located. Such creation errors do not reflect on  the standard of medical care.   Benjiman Core, PA-C  Primary Care at [facility] 07/31/2018 10:04 AM

## 2018-08-07 ENCOUNTER — Encounter: Payer: Self-pay | Admitting: Physician Assistant

## 2018-08-11 ENCOUNTER — Telehealth: Payer: Self-pay | Admitting: Family Medicine

## 2018-08-11 NOTE — Telephone Encounter (Signed)
Copied from CRM [phone]. Topic: General - Inquiry >> Aug 11, 2018  8:37 AM Lenoria ChimeBeasley, Denise S wrote: Reason for CRM: Pt was prescribed Prednisone for arm pain. He is still having pain and wanted to know if there is anything else he could take.

## 2018-08-12 NOTE — Telephone Encounter (Signed)
Left message to call back and discuss symptoms.

## 2018-08-12 NOTE — Telephone Encounter (Signed)
Patient called to discuss his arm pain, he says he doesn't want to see a specialist that he will wait until his appointment on 09/12/18 to discuss further with Dr. Clelia CroftShaw. He says he will just take Ibuprofen for the pain when it flares up. Advised to call back if he needs to be seen sooner, he verbalized understanding.

## 2018-08-22 ENCOUNTER — Other Ambulatory Visit: Payer: Self-pay | Admitting: Physician Assistant

## 2018-08-22 DIAGNOSIS — I10 Essential (primary) hypertension: Secondary | ICD-10-CM

## 2018-09-12 ENCOUNTER — Encounter: Payer: Self-pay | Admitting: Family Medicine

## 2018-09-12 ENCOUNTER — Ambulatory Visit (INDEPENDENT_AMBULATORY_CARE_PROVIDER_SITE_OTHER): Payer: Commercial Managed Care - PPO

## 2018-09-12 ENCOUNTER — Ambulatory Visit (INDEPENDENT_AMBULATORY_CARE_PROVIDER_SITE_OTHER): Payer: Commercial Managed Care - PPO | Admitting: Family Medicine

## 2018-09-12 ENCOUNTER — Other Ambulatory Visit: Payer: Self-pay

## 2018-09-12 VITALS — BP 134/84 | HR 66 | Temp 97.7°F | Ht 69.0 in | Wt 163.0 lb

## 2018-09-12 DIAGNOSIS — Z130 Encounter for screening for diseases of the blood and blood-forming organs and certain disorders involving the immune mechanism: Secondary | ICD-10-CM

## 2018-09-12 DIAGNOSIS — Z1329 Encounter for screening for other suspected endocrine disorder: Secondary | ICD-10-CM | POA: Diagnosis not present

## 2018-09-12 DIAGNOSIS — E7800 Pure hypercholesterolemia, unspecified: Secondary | ICD-10-CM | POA: Diagnosis not present

## 2018-09-12 DIAGNOSIS — M5412 Radiculopathy, cervical region: Secondary | ICD-10-CM | POA: Diagnosis not present

## 2018-09-12 DIAGNOSIS — Z125 Encounter for screening for malignant neoplasm of prostate: Secondary | ICD-10-CM

## 2018-09-12 DIAGNOSIS — Z1389 Encounter for screening for other disorder: Secondary | ICD-10-CM

## 2018-09-12 DIAGNOSIS — Z1383 Encounter for screening for respiratory disorder NEC: Secondary | ICD-10-CM

## 2018-09-12 DIAGNOSIS — Z23 Encounter for immunization: Secondary | ICD-10-CM

## 2018-09-12 DIAGNOSIS — J439 Emphysema, unspecified: Secondary | ICD-10-CM | POA: Diagnosis not present

## 2018-09-12 DIAGNOSIS — Z0001 Encounter for general adult medical examination with abnormal findings: Secondary | ICD-10-CM | POA: Diagnosis not present

## 2018-09-12 DIAGNOSIS — Z136 Encounter for screening for cardiovascular disorders: Secondary | ICD-10-CM

## 2018-09-12 DIAGNOSIS — I10 Essential (primary) hypertension: Secondary | ICD-10-CM

## 2018-09-12 DIAGNOSIS — F17201 Nicotine dependence, unspecified, in remission: Secondary | ICD-10-CM | POA: Diagnosis not present

## 2018-09-12 DIAGNOSIS — Z0000 Encounter for general adult medical examination without abnormal findings: Secondary | ICD-10-CM

## 2018-09-12 LAB — POCT URINALYSIS DIP (MANUAL ENTRY)
Bilirubin, UA: NEGATIVE
Blood, UA: NEGATIVE
Glucose, UA: NEGATIVE mg/dL
Ketones, POC UA: NEGATIVE mg/dL
Leukocytes, UA: NEGATIVE
Nitrite, UA: NEGATIVE
Protein Ur, POC: NEGATIVE mg/dL
Spec Grav, UA: 1.02 (ref 1.010–1.025)
Urobilinogen, UA: 1 E.U./dL
pH, UA: 7.5 (ref 5.0–8.0)

## 2018-09-12 MED ORDER — LISINOPRIL-HYDROCHLOROTHIAZIDE 20-25 MG PO TABS: 1.0000 | ORAL_TABLET | Freq: Every day | ORAL | 3 refills | Status: DC

## 2018-09-12 MED ORDER — PRAVASTATIN SODIUM 40 MG PO TABS: ORAL_TABLET | ORAL | 3 refills | Status: DC

## 2018-09-12 MED ORDER — ACETAMINOPHEN-CODEINE #3 300-30 MG PO TABS: 1.0000 | ORAL_TABLET | Freq: Four times a day (QID) | ORAL | 0 refills | Status: DC | PRN

## 2018-09-12 MED ORDER — PREDNISONE 20 MG PO TABS: ORAL_TABLET | ORAL | 0 refills | Status: DC

## 2018-09-12 MED ORDER — DICLOFENAC SODIUM 75 MG PO TBEC: 75.0000 mg | DELAYED_RELEASE_TABLET | Freq: Two times a day (BID) | ORAL | 1 refills | Status: DC

## 2018-09-12 NOTE — Progress Notes (Deleted)
Subjective:    Patient ID: Nilda RiggsJames Neumann; male   DOB: [birth year]; 57 y.o.   MRN: [id number]  Chief Complaint  Patient presents with  . pinched nerve in the neck    pain in both arms now   . Depression    PHQ9=8    HPI Primary Preventative Screenings: Prostate Cancer:  Lab Results  Component Value Date   PSA1 0.9 05/07/2017   STI screening:  Colorectal Cancer: Tobacco use/AAA/Lung Cancer/EtOH/Illicit substances:  Cardiac: Weight/Blood sugar/Diet/Exercise: BMI Readings from Last 3 Encounters:  09/12/18 24.07 kg/m  07/31/18 23.33 kg/m  05/07/17 24.34 kg/m   Lab Results  Component Value Date   HGBA1C 5.1 10/12/2014   OTC/Vit/Supp/Herbal: Dentist/Optho: Immunizations:  Immunization History  Administered Date(s) Administered  . Influenza,inj,Quad PF,6+ Mos 09/12/2018  . Tdap 12/03/2014    Chronic Medical Conditions: ***  Medical History: Past Medical History:  Diagnosis Date  . Hyperlipidemia   . Hypertension    Past Surgical History:  Procedure Laterality Date  . FRACTURE SURGERY     Current Outpatient Medications on File Prior to Visit  Medication Sig Dispense Refill  . aspirin 81 MG tablet Take 81 mg by mouth daily.    . Ibuprofen-Famotidine (DUEXIS) 800-26.6 MG TABS Take 1 tablet by mouth daily. 90 tablet 0  . Specialty Vitamins Products (PROSTATE PO) Take by mouth.     No current facility-administered medications on file prior to visit.    No Known Allergies Family History  Problem Relation Age of Onset  . Stroke Father   . Hypertension Sister    Social History   Socioeconomic History  . Marital status: Legally Separated    Spouse name: Not on file  . Number of children: Not on file  . Years of education: Not on file  . Highest education level: Not on file  Occupational History  . Not on file  Social Needs  . Financial resource strain: Not on file  . Food insecurity:    Worry: Not on file    Inability: Not on file  .  Transportation needs:    Medical: Not on file    Non-medical: Not on file  Tobacco Use  . Smoking status: Never Smoker  . Smokeless tobacco: Never Used  Substance and Sexual Activity  . Alcohol use: Yes    Alcohol/week: 0.0 standard drinks  . Drug use: No  . Sexual activity: Never  Lifestyle  . Physical activity:    Days per week: Not on file    Minutes per session: Not on file  . Stress: Not on file  Relationships  . Social connections:    Talks on phone: Not on file    Gets together: Not on file    Attends religious service: Not on file    Active member of club or organization: Not on file    Attends meetings of clubs or organizations: Not on file    Relationship status: Not on file  Other Topics Concern  . Not on file  Social History Narrative   Divorced; 58 year old son (2018)   2018 engaged to 58 yo woman who has 3 girls - 69, 9010, 58 yo and they are expecting a baby girl 08/23/2017 (not planned)   Salesperson; drives a lot to locations   [facility] 09/12/2018 07/31/2018 05/07/2017 12/29/2015 06/02/2015  Decreased Interest 0 2 0 0 0  Down, Depressed, Hopeless 2 2 0 0 0  PHQ - 2 Score 2 4 0  0 0  Altered sleeping 3 1 - - -  Tired, decreased energy 1 2 - - -  Change in appetite 0 2 - - -  Feeling bad or failure about yourself  1 2 - - -  Trouble concentrating 0 2 - - -  Moving slowly or fidgety/restless 0 0 - - -  Suicidal thoughts 1 0 - - -  PHQ-9 Score 8 13 - - -  Difficult doing work/chores Very difficult Very difficult - - -     ROS Otherwise as noted in HPI  Objective:  BP 134/84 (BP Location: Right Arm, Patient Position: Sitting, Cuff Size: Normal)   Pulse 66   Temp 97.7 F (36.5 C) (Oral)   Ht 5\' 9"  (1.753 m)   Wt 163 lb (73.9 kg)   SpO2 99%   BMI 24.07 kg/m  No exam data present Physical Exam       POC TESTING No visits with results within 3 Day(s) from this visit.  Latest known visit with results is:  Office Visit on 05/07/2017    Component Date Value Ref Range Status  . WBC 05/07/2017 6.5  3.4 - 10.8 x10E3/uL Final  . RBC 05/07/2017 5.12  4.14 - 5.80 x10E6/uL Final  . Hemoglobin 05/07/2017 15.6  13.0 - 17.7 g/dL Final  . Hematocrit 04/54/098108/14/2018 47.2  37.5 - 51.0 % Final  . MCV 05/07/2017 92  79 - 97 fL Final  . MCH 05/07/2017 30.5  26.6 - 33.0 pg Final  . MCHC 05/07/2017 33.1  31.5 - 35.7 g/dL Final  . RDW 19/14/782908/14/2018 14.5  12.3 - 15.4 % Final  . Platelets 05/07/2017 243  150 - 379 x10E3/uL Final  . Glucose 05/07/2017 98  65 - 99 mg/dL Final  . BUN 56/21/308608/14/2018 12  6 - 24 mg/dL Final  . Creatinine, Ser 05/07/2017 1.20  0.76 - 1.27 mg/dL Final  . GFR calc non Af Amer 05/07/2017 67  >59 mL/min/1.73 Final  . GFR calc Af Amer 05/07/2017 77  >59 mL/min/1.73 Final  . BUN/Creatinine Ratio 05/07/2017 10  9 - 20 Final  . Sodium 05/07/2017 144  134 - 144 mmol/L Final  . Potassium 05/07/2017 4.6  3.5 - 5.2 mmol/L Final  . Chloride 05/07/2017 104  96 - 106 mmol/L Final  . CO2 05/07/2017 26  20 - 29 mmol/L Final  . Calcium 05/07/2017 9.8  8.7 - 10.2 mg/dL Final  . Total Protein 05/07/2017 6.9  6.0 - 8.5 g/dL Final  . Albumin 57/84/696208/14/2018 4.7  3.5 - 5.5 g/dL Final  . Globulin, Total 05/07/2017 2.2  1.5 - 4.5 g/dL Final  . Albumin/Globulin Ratio 05/07/2017 2.1  1.2 - 2.2 Final  . Bilirubin Total 05/07/2017 0.3  0.0 - 1.2 mg/dL Final  . Alkaline Phosphatase 05/07/2017 57  39 - 117 IU/L Final  . AST 05/07/2017 24  0 - 40 IU/L Final  . ALT 05/07/2017 25  0 - 44 IU/L Final  . Cholesterol, Total 05/07/2017 157  100 - 199 mg/dL Final  . Triglycerides 05/07/2017 88  0 - 149 mg/dL Final  . HDL 95/28/413208/14/2018 45  >39 mg/dL Final  . VLDL Cholesterol Cal 05/07/2017 18  5 - 40 mg/dL Final  . LDL Calculated 05/07/2017 94  0 - 99 mg/dL Final  . Chol/HDL Ratio 05/07/2017 3.5  0.0 - 5.0 ratio Final   Comment:  T. Chol/HDL Ratio                                             Men  Women                                1/2 Avg.Risk  3.4    3.3                                   Avg.Risk  5.0    4.4                                2X Avg.Risk  9.6    7.1                                3X Avg.Risk 23.4   11.0   . TSH 05/07/2017 2.160  0.450 - 4.500 uIU/mL Final  . Prostate Specific Ag, Serum 05/07/2017 0.9  0.0 - 4.0 ng/mL Final   Comment: Roche ECLIA methodology. According to the American Urological Association, Serum PSA should decrease and remain at undetectable levels after radical prostatectomy. The AUA defines biochemical recurrence as an initial PSA value 0.2 ng/mL or greater followed by a subsequent confirmatory PSA value 0.2 ng/mL or greater. Values obtained with different assay methods or kits cannot be used interchangeably. Results cannot be interpreted as absolute evidence of the presence or absence of malignant disease.   . Color, UA 05/07/2017 yellow  yellow Final  . Clarity, UA 05/07/2017 clear  clear Final  . Glucose, UA 05/07/2017 negative  negative mg/dL Final  . Bilirubin, UA 05/07/2017 negative  negative Final  . Ketones, POC UA 05/07/2017 negative  negative mg/dL Final  . Spec Grav, UA 05/07/2017 1.025  1.010 - 1.025 Final  . Blood, UA 05/07/2017 trace-lysed* negative Final  . pH, UA 05/07/2017 5.0  5.0 - 8.0 Final  . Protein Ur, POC 05/07/2017 negative  negative mg/dL Final  . Urobilinogen, UA 05/07/2017 0.2  0.2 or 1.0 E.U./dL Final  . Nitrite, UA 14/78/2956 Negative  Negative Final  . Leukocytes, UA 05/07/2017 Negative  Negative Final     Assessment & Plan:   1. Annual physical exam   2. Screening for cardiovascular, respiratory, and genitourinary diseases   3. Screening for prostate cancer   4. Screening for thyroid disorder   5. Screening for deficiency anemia   6. Essential hypertension, benign   7. Pure hypercholesterolemia   8. Tobacco abuse, in remission   9. Cervical radiculopathy     ***  Patient will continue on current chronic medications other than  changes noted above, so ok to refill when needed.   Reviewed all health maintenance recommendations per USPSTF guidelines.   See after visit summary for patient specific instructions.  Orders Placed This Encounter  Procedures  . DG Chest 2 View    Standing Status:   Future    Number of Occurrences:   1    Standing Expiration Date:   09/12/2019    Order Specific Question:   Reason for Exam (SYMPTOM  OR DIAGNOSIS REQUIRED)    Answer:  h/o tobacco use; RLL rhonchi    Order Specific Question:   Preferred imaging location?    Answer:   External  . Flu Vaccine QUAD 36+ mos IM  . Comprehensive metabolic panel  . CBC  . TSH  . Lipid panel  . PSA  . Ambulatory referral to Physical Therapy    Referral Priority:   Routine    Referral Type:   Physical Medicine    Referral Reason:   Specialty Services Required    Requested Specialty:   Physical Therapy    Number of Visits Requested:   1  . EKG 12-Lead    Meds ordered this encounter  Medications  . acetaminophen-codeine (TYLENOL #3) 300-30 MG tablet    Sig: Take 1-2 tablets by mouth every 6 (six) hours as needed for moderate pain.    Dispense:  40 tablet    Refill:  0  . lisinopril-hydrochlorothiazide (PRINZIDE,ZESTORETIC) 20-25 MG tablet    Sig: Take 1 tablet by mouth daily.    Dispense:  90 tablet    Refill:  3  . pravastatin (PRAVACHOL) 40 MG tablet    Sig: TAKE 1 TABLET BY MOUTH EVERYDAY AT BEDTIME    Dispense:  90 tablet    Refill:  3  . predniSONE (DELTASONE) 20 MG tablet    Sig: Take 3 PO QAM x3days, 2 PO QAM x3days, 1 PO QAM x3days    Dispense:  18 tablet    Refill:  0  . diclofenac (VOLTAREN) 75 MG EC tablet    Sig: Take 1 tablet (75 mg total) by mouth 2 (two) times daily. START AFTER PREDNISONE COURSE IS COMPLETE    Dispense:  60 tablet    Refill:  1    Patient verbalized to me that they understand the following: diagnosis, what is being done for them, what to expect and what should be done at home.  Their questions  have been answered. They understand that I am unable to predict every possible medication interaction or adverse outcome and that if any unexpected symptoms arise, they should contact us and their pharmacist, as well as never hesitate to seek urgent/emergent care at [facility] or ER if they think it might be warranted.    Norberto Sorenson, MD, MPH [facility] [address] [place], [place]  [phone] Office phone  [phone] Office fax   09/12/18 11:09 AM

## 2018-09-12 NOTE — Patient Instructions (Addendum)
If you are still having pain in another 6 weeks or after trying 2 months of physical therapy without significant improvement, then I highly recommend that you make a return visit to Dr. August Saucerean to rediscuss injections versus surgery before you develop permanent nerve damage and muscle weakness.   Dg Chest 2 View  Result Date: 09/12/2018 CLINICAL DATA:  57 year old male with a history of tobacco use right lower lobe rhonchi EXAM: CHEST - 2 VIEW COMPARISON:  None. FINDINGS: Cardiomediastinal silhouette within normal limits. No evidence of central vascular congestion or interlobular septal thickening. Stigmata of emphysema, with increased retrosternal airspace, flattened hemidiaphragms, increased AP diameter, and hyperinflation on the AP view. No confluent airspace disease, pneumothorax, or pleural effusion. No displaced fracture. IMPRESSION: Changes of emphysema without evidence of superimposed acute cardiopulmonary disease Electronically Signed   By: Gilmer MorJaime  Wagner D.O.   On: 09/12/2018 10:49     If you have lab work done today you will be contacted with your lab results within the next 2 weeks.  If you have not heard from us then please contact us. The fastest way to get your results is to register for My Chart.   IF you received an x-ray today, you will receive an invoice from [facility]. Please contact [facility] at [phone] with questions or concerns regarding your invoice.   IF you received labwork today, you will receive an invoice from [facility]. Please contact [facility] at [phone] with questions or concerns regarding your invoice.   Our billing staff will not be able to assist you with questions regarding bills from these companies.  You will be contacted with the lab results as soon as they are available. The fastest way to get your results is to activate your My Chart account. Instructions are located on the last page of this paperwork. If you have not heard from  us regarding the results in 2 weeks, please contact this office.     Chronic Obstructive Pulmonary Disease  Chronic obstructive pulmonary disease (COPD) is a long-term (chronic) condition that affects the lungs. COPD is a general term that can be used to describe many different lung problems that cause lung swelling (inflammation) and limit airflow, including chronic bronchitis and emphysema. If you have COPD, your lung function will probably never return to normal. In most cases, it gets worse over time. However, there are steps you can take to slow the progression of the disease and improve your quality of life. What are the causes? This condition may be caused by:  Smoking. This is the most common cause.  Certain genes passed down through families. What increases the risk? The following factors may make you more likely to develop this condition:  Secondhand smoke from cigarettes, pipes, or cigars.  Exposure to chemicals and other irritants such as fumes and dust in the work environment.  Chronic lung conditions or infections. What are the signs or symptoms? Symptoms of this condition include:  Shortness of breath, especially during physical activity.  Chronic cough with a large amount of thick mucus. Sometimes the cough may not have any mucus (dry cough).  Wheezing.  Rapid breaths.  Gray or bluish discoloration (cyanosis) of the skin, especially in your fingers, toes, or lips.  Feeling tired (fatigue).  Weight loss.  Chest tightness.  Frequent infections.  Episodes when breathing symptoms become much worse (exacerbations).  Swelling in the ankles, feet, or legs. This may occur in later stages of the disease. How is this diagnosed? This condition is  diagnosed based on:  Your medical history.  A physical exam. You may also have tests, including:  Lung (pulmonary) function tests. This may include a spirometry test, which measures your ability to exhale  properly.  Chest X-ray.  CT scan.  Blood tests. How is this treated? This condition may be treated with:  Medicines. These may include inhaled rescue medicines to treat acute exacerbations as well as long-term, or maintenance, medicines to prevent flare-ups of COPD. ? Bronchodilators help treat COPD by dilating the airways to allow increased airflow and make your breathing more comfortable. ? Steroids can reduce airway inflammation and help prevent exacerbations.  Smoking cessation. If you smoke, your health care provider may ask you to quit, and may also recommend therapy or replacement products to help you quit.  Pulmonary rehabilitation. This may involve working with a team of health care providers and specialists, such as respiratory, occupational, and physical therapists.  Exercise and physical activity. These are beneficial for nearly all people with COPD.  Nutrition therapy to gain weight, if you are underweight.  Oxygen. Supplemental oxygen therapy is only helpful if you have a low oxygen level in your blood (hypoxemia).  Lung surgery or transplant.  Palliative care. This is to help people with COPD feel comfortable when treatment is no longer working. Follow these instructions at home: Medicines  Take over-the-counter and prescription medicines (inhaled or pills) only as told by your health care provider.  Talk to your health care provider before taking any cough or allergy medicines. You may need to avoid certain medicines that dry out your airways. Lifestyle  If you are a smoker, the most important thing that you can do is to stop smoking. Do not use any products that contain nicotine or tobacco, such as cigarettes and e-cigarettes. If you need help quitting, ask your health care provider. Continuing to smoke will cause the disease to progress faster.  Avoid exposure to things that irritate your lungs, such as smoke, chemicals, and fumes.  Stay active, but balance  activity with periods of rest. Exercise and physical activity will help you maintain your ability to do things you want to do.  Learn and use relaxation techniques to manage stress and to control your breathing.  Get the right amount of sleep and get quality sleep. Most adults need 7 or more hours per night.  Eat healthy foods. Eating smaller, more frequent meals and resting before meals may help you maintain your strength. Controlled breathing Learn and use controlled breathing techniques as directed by your health care provider. Controlled breathing techniques include:  Pursed lip breathing. Start by breathing in (inhaling) through your nose for 1 second. Then, purse your lips as if you were going to whistle and breathe out (exhale) through the pursed lips for 2 seconds.  Diaphragmatic breathing. Start by putting one hand on your abdomen just above your waist. Inhale slowly through your nose. The hand on your abdomen should move out. Then purse your lips and exhale slowly. You should be able to feel the hand on your abdomen moving in as you exhale. Controlled coughing Learn and use controlled coughing to clear mucus from your lungs. Controlled coughing is a series of short, progressive coughs. The steps of controlled coughing are: 1. Lean your head slightly forward. 2. Breathe in deeply using diaphragmatic breathing. 3. Try to hold your breath for 3 seconds. 4. Keep your mouth slightly open while coughing twice. 5. Spit any mucus out into a tissue. 6. Rest and  repeat the steps once or twice as needed. General instructions  Make sure you receive all the vaccines that your health care provider recommends, especially the pneumococcal and influenza vaccines. Preventing infection and hospitalization is very important when you have COPD.  Use oxygen therapy and pulmonary rehabilitation if directed to by your health care provider. If you require home oxygen therapy, ask your health care provider  whether you should purchase a pulse oximeter to measure your oxygen level at home.  Work with your health care provider to develop a COPD action plan. This will help you know what steps to take if your condition gets worse.  Keep other chronic health conditions under control as told by your health care provider.  Avoid extreme temperature and humidity changes.  Avoid contact with people who have an illness that spreads from person to person (is contagious), such as viral infections or pneumonia.  Keep all follow-up visits as told by your health care provider. This is important. Contact a health care provider if:  You are coughing up more mucus than usual.  There is a change in the color or thickness of your mucus.  Your breathing is more labored than usual.  Your breathing is faster than usual.  You have difficulty sleeping.  You need to use your rescue medicines or inhalers more often than expected.  You have trouble doing routine activities such as getting dressed or walking around the house. Get help right away if:  You have shortness of breath while you are resting.  You have shortness of breath that prevents you from: ? Being able to talk. ? Performing your usual physical activities.  You have chest pain lasting longer than 5 minutes.  Your skin color is more blue (cyanotic) than usual.  You measure low oxygen saturations for longer than 5 minutes with a pulse oximeter.  You have a fever.  You feel too tired to breathe normally. Summary  Chronic obstructive pulmonary disease (COPD) is a long-term (chronic) condition that affects the lungs.  Your lung function will probably never return to normal. In most cases, it gets worse over time. However, there are steps you can take to slow the progression of the disease and improve your quality of life.  Treatment for COPD may include taking medicines, quitting smoking, pulmonary rehabilitation, and changes to diet and  exercise. As the disease progresses, you may need oxygen therapy, a lung transplant, or palliative care.  To help manage your condition, do not smoke, avoid exposure to things that irritate your lungs, stay up to date on all vaccines, and follow your health care provider's instructions for taking medicines. This information is not intended to replace advice given to you by your health care provider. Make sure you discuss any questions you have with your health care provider. Document Released: 06/20/2005 Document Revised: 03/06/2017 Document Reviewed: 10/15/2016 Elsevier Interactive Patient Education  2019 [employer].

## 2018-09-12 NOTE — Progress Notes (Signed)
Subjective:    Patient: Scott Pruitt  DOB: November 16, 1960; 57 y.o.   MRN: [id number]  Chief Complaint  Patient presents with  . pinched nerve in the neck    pain in both arms now   . Depression    PHQ9=8  . Annual Exam     HPI   Primary Preventative Screenings: Prostate Cancer:  Lab Results  Component Value Date   PSA1 0.9 05/07/2017   STI screening:  Colorectal Cancer: Tobacco use/AAA/Lung Cancer/EtOH/Illicit substances:  Cardiac: Weight/Blood sugar/Diet/Exercise: BMI Readings from Last 3 Encounters:  09/12/18 24.07 kg/m  07/31/18 23.33 kg/m  05/07/17 24.34 kg/m   Lab Results  Component Value Date   HGBA1C 5.1 10/12/2014   OTC/Vit/Supp/Herbal: Dentist/Optho: Immunizations:  Immunization History  Administered Date(s) Administered  . Influenza,inj,Quad PF,6+ Mos 09/12/2018  . Tdap 12/03/2014    Chronic Medical Conditions:   Had nerve pain in left elbow - had MRI and then told he didn't need the surgery so was just but on the ibuprofen for 6 more weeks of treatment as well as the $865 bill for the MRI - had tons of trouble with the painment plan. So his sxs started happening again when he had with [place]. Frustrated that I was out and so he oculdn't get in any apointment. Prednisone worked the first day only. But the 3rd day the pain was back. In the past 6 weks since he was seen  Originally was in the left but when he was here 6 weeks ago it had started on the right - mainly burnin above lateral right elbox - does not go dwn into finer - sometimes can feel in back of right shoulder. Constant though waxes/wanes. No indigestion/heartburn. Had been taking motrin 4 tabs twice a day Medical History Past Medical History:  Diagnosis Date  . Hyperlipidemia   . Hypertension    Past Surgical History:  Procedure Laterality Date  . FRACTURE SURGERY     Current Outpatient Medications on File Prior to Visit  Medication Sig Dispense Refill  . aspirin 81 MG tablet  Take 81 mg by mouth daily.    . Ibuprofen-Famotidine (DUEXIS) 800-26.6 MG TABS Take 1 tablet by mouth daily. 90 tablet 0  . lisinopril-hydrochlorothiazide (PRINZIDE,ZESTORETIC) 20-25 MG tablet TAKE 1 TABLET BY MOUTH EVERY DAY 90 tablet 0  . pravastatin (PRAVACHOL) 40 MG tablet TAKE 1 TABLET BY MOUTH EVERYDAY AT BEDTIME 90 tablet 3  . Specialty Vitamins Products (PROSTATE PO) Take by mouth.    Marland Kitchen acetaminophen-codeine (TYLENOL #3) 300-30 MG tablet Take 1 tablet by mouth every 8 (eight) hours as needed for moderate pain. (Patient not taking: Reported on 09/12/2018) 30 tablet 0   No current facility-administered medications on file prior to visit.    No Known Allergies Family History  Problem Relation Age of Onset  . Stroke Father   . Hypertension Sister   smoked from 5 to ~37 yo but not daily and quit for long periods Social History   Socioeconomic History  . Marital status: Legally Separated    Spouse name: Not on file  . Number of children: Not on file  . Years of education: Not on file  . Highest education level: Not on file  Occupational History  . Not on file  Social Needs  . Financial resource strain: Not on file  . Food insecurity:    Worry: Not on file    Inability: Not on file  . Transportation needs:    Medical: Not on file  Non-medical: Not on file  Tobacco Use  . Smoking status: Never Smoker  . Smokeless tobacco: Never Used  Substance and Sexual Activity  . Alcohol use: Yes    Alcohol/week: 0.0 standard drinks  . Drug use: No  . Sexual activity: Never  Lifestyle  . Physical activity:    Days per week: Not on file    Minutes per session: Not on file  . Stress: Not on file  Relationships  . Social connections:    Talks on phone: Not on file    Gets together: Not on file    Attends religious service: Not on file    Active member of club or organization: Not on file    Attends meetings of clubs or organizations: Not on file    Relationship status: Not on  file  Other Topics Concern  . Not on file  Social History Narrative   Divorced; 86 year old son (2018)   2018 engaged to 38 yo woman who has 3 girls - 72, 83, 59 yo and they are expecting a baby girl 08/23/2017 (not planned)   Salesperson; drives a lot to locations   Depression screen [facility] 2/9 09/12/2018 07/31/2018 05/07/2017 12/29/2015 06/02/2015  Decreased Interest 0 2 0 0 0  Down, Depressed, Hopeless 2 2 0 0 0  PHQ - 2 Score 2 4 0 0 0  Altered sleeping 3 1 - - -  Tired, decreased energy 1 2 - - -  Change in appetite 0 2 - - -  Feeling bad or failure about yourself  1 2 - - -  Trouble concentrating 0 2 - - -  Moving slowly or fidgety/restless 0 0 - - -  Suicidal thoughts 1 0 - - -  PHQ-9 Score 8 13 - - -  Difficult doing work/chores Very difficult Very difficult - - -    ROS As noted in HPI  Objective:  BP 134/84 (BP Location: Right Arm, Patient Position: Sitting, Cuff Size: Normal)   Pulse 66   Temp 97.7 F (36.5 C) (Oral)   Ht 5\' 9"  (1.753 m)   Wt 163 lb (73.9 kg)   SpO2 99%   BMI 24.07 kg/m  Physical Exam Exam conducted with a chaperone present.  Constitutional:      General: He is not in acute distress.    Appearance: He is well-developed. He is not diaphoretic.  HENT:     Head: Normocephalic and atraumatic.     Right Ear: Tympanic membrane, ear canal and external ear normal.     Left Ear: Tympanic membrane, ear canal and external ear normal.     Nose: Nose normal.     Mouth/Throat:     Pharynx: Uvula midline. No oropharyngeal exudate.  Eyes:     General: No scleral icterus.       Right eye: No discharge.        Left eye: No discharge.     Conjunctiva/sclera: Conjunctivae normal.  Neck:     Musculoskeletal: Normal range of motion and neck supple.     Thyroid: No thyromegaly.  Cardiovascular:     Rate and Rhythm: Normal rate and regular rhythm.     Heart sounds: Normal heart sounds.  Pulmonary:     Effort: Pulmonary effort is normal. No respiratory distress.       Breath sounds: Normal breath sounds.  Abdominal:     General: Bowel sounds are normal. There is no distension.     Palpations: Abdomen is  soft. There is no mass.     Tenderness: There is no abdominal tenderness. There is no guarding or rebound.  Genitourinary:    Prostate: Normal. Not enlarged, not tender and no nodules present.     Rectum: Normal. No mass or external hemorrhoid. Normal anal tone.  Lymphadenopathy:     Cervical: No cervical adenopathy.  Skin:    General: Skin is warm and dry.     Findings: No erythema or rash.  Neurological:     Mental Status: He is alert and oriented to person, place, and time.     Cranial Nerves: No cranial nerve deficit.     Motor: No abnormal muscle tone.     Deep Tendon Reflexes: Reflexes are normal and symmetric. Reflexes normal.  Psychiatric:        Behavior: Behavior normal.    Right bicep 4+/5  POC TESTING No visits with results within 3 Day(s) from this visit.  Latest known visit with results is:  Office Visit on 05/07/2017  Component Date Value Ref Range Status  . WBC 05/07/2017 6.5  3.4 - 10.8 x10E3/uL Final  . RBC 05/07/2017 5.12  4.14 - 5.80 x10E6/uL Final  . Hemoglobin 05/07/2017 15.6  13.0 - 17.7 g/dL Final  . Hematocrit 16/10/960408/14/2018 47.2  37.5 - 51.0 % Final  . MCV 05/07/2017 92  79 - 97 fL Final  . MCH 05/07/2017 30.5  26.6 - 33.0 pg Final  . MCHC 05/07/2017 33.1  31.5 - 35.7 g/dL Final  . RDW 54/09/811908/14/2018 14.5  12.3 - 15.4 % Final  . Platelets 05/07/2017 243  150 - 379 x10E3/uL Final  . Glucose 05/07/2017 98  65 - 99 mg/dL Final  . BUN 14/78/295608/14/2018 12  6 - 24 mg/dL Final  . Creatinine, Ser 05/07/2017 1.20  0.76 - 1.27 mg/dL Final  . GFR calc non Af Amer 05/07/2017 67  >59 mL/min/1.73 Final  . GFR calc Af Amer 05/07/2017 77  >59 mL/min/1.73 Final  . BUN/Creatinine Ratio 05/07/2017 10  9 - 20 Final  . Sodium 05/07/2017 144  134 - 144 mmol/L Final  . Potassium 05/07/2017 4.6  3.5 - 5.2 mmol/L Final  . Chloride 05/07/2017  104  96 - 106 mmol/L Final  . CO2 05/07/2017 26  20 - 29 mmol/L Final  . Calcium 05/07/2017 9.8  8.7 - 10.2 mg/dL Final  . Total Protein 05/07/2017 6.9  6.0 - 8.5 g/dL Final  . Albumin 21/30/865708/14/2018 4.7  3.5 - 5.5 g/dL Final  . Globulin, Total 05/07/2017 2.2  1.5 - 4.5 g/dL Final  . Albumin/Globulin Ratio 05/07/2017 2.1  1.2 - 2.2 Final  . Bilirubin Total 05/07/2017 0.3  0.0 - 1.2 mg/dL Final  . Alkaline Phosphatase 05/07/2017 57  39 - 117 IU/L Final  . AST 05/07/2017 24  0 - 40 IU/L Final  . ALT 05/07/2017 25  0 - 44 IU/L Final  . Cholesterol, Total 05/07/2017 157  100 - 199 mg/dL Final  . Triglycerides 05/07/2017 88  0 - 149 mg/dL Final  . HDL 84/69/629508/14/2018 45  >39 mg/dL Final  . VLDL Cholesterol Cal 05/07/2017 18  5 - 40 mg/dL Final  . LDL Calculated 05/07/2017 94  0 - 99 mg/dL Final  . Chol/HDL Ratio 05/07/2017 3.5  0.0 - 5.0 ratio Final   Comment:  T. Chol/HDL Ratio                                             Men  Women                               1/2 Avg.Risk  3.4    3.3                                   Avg.Risk  5.0    4.4                                2X Avg.Risk  9.6    7.1                                3X Avg.Risk 23.4   11.0   . TSH 05/07/2017 2.160  0.450 - 4.500 uIU/mL Final  . Prostate Specific Ag, Serum 05/07/2017 0.9  0.0 - 4.0 ng/mL Final   Comment: Roche ECLIA methodology. According to the American Urological Association, Serum PSA should decrease and remain at undetectable levels after radical prostatectomy. The AUA defines biochemical recurrence as an initial PSA value 0.2 ng/mL or greater followed by a subsequent confirmatory PSA value 0.2 ng/mL or greater. Values obtained with different assay methods or kits cannot be used interchangeably. Results cannot be interpreted as absolute evidence of the presence or absence of malignant disease.   . Color, UA 05/07/2017 yellow  yellow Final  . Clarity, UA 05/07/2017 clear  clear Final   . Glucose, UA 05/07/2017 negative  negative mg/dL Final  . Bilirubin, UA 05/07/2017 negative  negative Final  . Ketones, POC UA 05/07/2017 negative  negative mg/dL Final  . Spec Grav, UA 05/07/2017 1.025  1.010 - 1.025 Final  . Blood, UA 05/07/2017 trace-lysed* negative Final  . pH, UA 05/07/2017 5.0  5.0 - 8.0 Final  . Protein Ur, POC 05/07/2017 negative  negative mg/dL Final  . Urobilinogen, UA 05/07/2017 0.2  0.2 or 1.0 E.U./dL Final  . Nitrite, UA 40/98/119108/14/2018 Negative  Negative Final  . Leukocytes, UA 05/07/2017 Negative  Negative Final    Dg Chest 2 View  Result Date: 09/12/2018 CLINICAL DATA:  57 year old male with a history of tobacco use right lower lobe rhonchi EXAM: CHEST - 2 VIEW COMPARISON:  None. FINDINGS: Cardiomediastinal silhouette within normal limits. No evidence of central vascular congestion or interlobular septal thickening. Stigmata of emphysema, with increased retrosternal airspace, flattened hemidiaphragms, increased AP diameter, and hyperinflation on the AP view. No confluent airspace disease, pneumothorax, or pleural effusion. No displaced fracture. IMPRESSION: Changes of emphysema without evidence of superimposed acute cardiopulmonary disease Electronically Signed   By: Gilmer MorJaime  Wagner D.O.   On: 09/12/2018 10:49   EKG: NSR, no acute ischemic changes noted. No significant change noted when compared to prior EKG done 12/03/2014.   I have personally reviewed the EKG tracing and agree with the computer interpretation. Sinus  Rhythm  WITHIN NORMAL LIMITS Assessment & Plan:   1. Annual physical exam   2. Screening for cardiovascular, respiratory, and genitourinary diseases   3. Screening for prostate  cancer   4. Screening for thyroid disorder   5. Screening for deficiency anemia   6. Essential hypertension, benign   7. Pure hypercholesterolemia   8. Tobacco abuse, in remission   9. Cervical radiculopathy       Patient will continue on current chronic medications  other than changes noted above, so ok to refill when needed.   See after visit summary for patient specific instructions.  Orders Placed This Encounter  Procedures  . DG Chest 2 View    Standing Status:   Future    Number of Occurrences:   1    Standing Expiration Date:   09/12/2019    Order Specific Question:   Reason for Exam (SYMPTOM  OR DIAGNOSIS REQUIRED)    Answer:   h/o tobacco use; RLL rhonchi    Order Specific Question:   Preferred imaging location?    Answer:   External  . Flu Vaccine QUAD 36+ mos IM  . Comprehensive metabolic panel  . CBC  . TSH  . Lipid panel  . PSA  . Ambulatory referral to Physical Therapy    Referral Priority:   Routine    Referral Type:   Physical Medicine    Referral Reason:   Specialty Services Required    Requested Specialty:   Physical Therapy    Number of Visits Requested:   1  . POCT urinalysis dipstick  . EKG 12-Lead    Meds ordered this encounter  Medications  . acetaminophen-codeine (TYLENOL #3) 300-30 MG tablet    Sig: Take 1-2 tablets by mouth every 6 (six) hours as needed for moderate pain.    Dispense:  40 tablet    Refill:  0  . lisinopril-hydrochlorothiazide (PRINZIDE,ZESTORETIC) 20-25 MG tablet    Sig: Take 1 tablet by mouth daily.    Dispense:  90 tablet    Refill:  3  . pravastatin (PRAVACHOL) 40 MG tablet    Sig: TAKE 1 TABLET BY MOUTH EVERYDAY AT BEDTIME    Dispense:  90 tablet    Refill:  3  . predniSONE (DELTASONE) 20 MG tablet    Sig: Take 3 PO QAM x3days, 2 PO QAM x3days, 1 PO QAM x3days    Dispense:  18 tablet    Refill:  0  . diclofenac (VOLTAREN) 75 MG EC tablet    Sig: Take 1 tablet (75 mg total) by mouth 2 (two) times daily. START AFTER PREDNISONE COURSE IS COMPLETE    Dispense:  60 tablet    Refill:  1    Patient verbalized to me that they understand the following: diagnosis, what is being done for them, what to expect and what should be done at home.  Their questions have been answered. They  understand that I am unable to predict every possible medication interaction or adverse outcome and that if any unexpected symptoms arise, they should contact us and their pharmacist, as well as never hesitate to seek urgent/emergent care at [facility] or ER if they think it might be warranted.    Norberto Sorenson, MD, MPH Primary Care at [facility] [address] [place], [place]  [address] [phone] Office phone  [phone] Office fax  09/12/18 10:10 AM

## 2018-09-13 LAB — LIPID PANEL
Chol/HDL Ratio: 3.1 ratio (ref 0.0–5.0)
Cholesterol, Total: 178 mg/dL (ref 100–199)
HDL: 58 mg/dL (ref >39)
LDL Calculated: 107 mg/dL — ABNORMAL HIGH (ref 0–99)
Triglycerides: 66 mg/dL (ref 0–149)
VLDL Cholesterol Cal: 13 mg/dL (ref 5–40)

## 2018-09-13 LAB — COMPREHENSIVE METABOLIC PANEL
ALT: 23 IU/L (ref 0–44)
AST: 18 IU/L (ref 0–40)
Albumin/Globulin Ratio: 2.2 (ref 1.2–2.2)
Albumin: 4.9 g/dL (ref 3.5–5.5)
Alkaline Phosphatase: 63 IU/L (ref 39–117)
BUN/Creatinine Ratio: 11 (ref 9–20)
BUN: 11 mg/dL (ref 6–24)
Bilirubin Total: 0.4 mg/dL (ref 0.0–1.2)
CO2: 24 mmol/L (ref 20–29)
Calcium: 9.9 mg/dL (ref 8.7–10.2)
Chloride: 98 mmol/L (ref 96–106)
Creatinine, Ser: 1.03 mg/dL (ref 0.76–1.27)
GFR calc Af Amer: 92 mL/min/{1.73_m2} (ref >59)
GFR calc non Af Amer: 80 mL/min/{1.73_m2} (ref >59)
Globulin, Total: 2.2 g/dL (ref 1.5–4.5)
Glucose: 92 mg/dL (ref 65–99)
Potassium: 4.2 mmol/L (ref 3.5–5.2)
Sodium: 141 mmol/L (ref 134–144)
Total Protein: 7.1 g/dL (ref 6.0–8.5)

## 2018-09-13 LAB — PSA: Prostate Specific Ag, Serum: 0.9 ng/mL (ref 0.0–4.0)

## 2018-09-13 LAB — TSH: TSH: 0.682 u[IU]/mL (ref 0.450–4.500)

## 2018-09-13 LAB — CBC
Hematocrit: 45.9 % (ref 37.5–51.0)
Hemoglobin: 15.7 g/dL (ref 13.0–17.7)
MCH: 30.8 pg (ref 26.6–33.0)
MCHC: 34.2 g/dL (ref 31.5–35.7)
MCV: 90 fL (ref 79–97)
Platelets: 274 10*3/uL (ref 150–450)
RBC: 5.09 x10E6/uL (ref 4.14–5.80)
RDW: 13 % (ref 12.3–15.4)
WBC: 8.4 10*3/uL (ref 3.4–10.8)

## 2018-09-16 ENCOUNTER — Other Ambulatory Visit: Payer: Self-pay

## 2018-09-16 ENCOUNTER — Encounter: Payer: Self-pay | Admitting: Physical Therapy

## 2018-09-16 ENCOUNTER — Ambulatory Visit: Payer: Commercial Managed Care - PPO | Attending: Family Medicine | Admitting: Physical Therapy

## 2018-09-16 DIAGNOSIS — R293 Abnormal posture: Secondary | ICD-10-CM | POA: Diagnosis present

## 2018-09-16 DIAGNOSIS — M542 Cervicalgia: Secondary | ICD-10-CM

## 2018-09-16 DIAGNOSIS — M6281 Muscle weakness (generalized): Secondary | ICD-10-CM

## 2018-09-16 DIAGNOSIS — M5412 Radiculopathy, cervical region: Secondary | ICD-10-CM | POA: Diagnosis present

## 2018-09-16 NOTE — Patient Instructions (Signed)
Access Code: [email or web address]  URL: [email or web address]  Date: 09/16/2018  Prepared by: Veda CanningLynn Anakaren Campion   Exercises  Supine Cervical Rotation AROM on Pillow - 3 reps - 1 sets - 30 hold - 2x daily - 7x weekly  Supine Chin Tuck - 3 reps - 1 sets - 30 hold - 2x daily - 7x weekly  Seated Gentle Upper Trapezius Stretch - 3 reps - 1 sets - 30 hold - 2x daily - 7x weekly

## 2018-09-16 NOTE — Therapy (Addendum)
[facility] [address] [place], [place], [address] Phone: [phone]   Fax:  [phone]  Physical Therapy Evaluation  Patient Details  Name: Scott Pruitt MRN: [id number] Date of Birth: 1961-03-23 Referring Provider (PT): Sherren Mocha, MD   Encounter Date: 09/16/2018  PT End of Session - 09/16/18 1401    Visit Number  1    Number of Visits  9    Date for PT Re-Evaluation  10/16/18    Authorization Type  unknown at time of eval    PT Start Time  1249    PT Stop Time  1330    PT Time Calculation (min)  41 min    Activity Tolerance  Patient tolerated treatment well    Behavior During Therapy  [facility] for tasks assessed/performed       Past Medical History:  Diagnosis Date  . Hyperlipidemia   . Hypertension     Past Surgical History:  Procedure Laterality Date  . FRACTURE SURGERY      There were no vitals filed for this visit.   Subjective Assessment - 09/16/18 1259    Subjective  Pain began more than a year ago on the left side and it went away. Now is on the right side down to his elbow. 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    Currently in Pain?  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; Designer, multimedia for [employer] PT Assessment - 09/16/18 1306       Assessment   Medical Diagnosis   Cervical radiculopathy, RUE pain    Referring Provider (PT)  Sherren Mocha, MD    Onset Date/Surgical Date  --   MD referral 09/12/18   Hand Dominance  Right    Prior Therapy  none      Precautions   Precautions  None      Balance Screen   Has the patient fallen in the past 6 months  No      Prior Function   Level of Independence  Independent    Vocation  Full time employment    Leisure  has 69 yr old son and 1 yo daughter      Cognition   Overall Cognitive Status  Within Functional Limits for tasks assessed      Observation/Other Assessments   Focus on Therapeutic Outcomes (FOTO)   not set up      Posture/Postural Control   Posture/Postural Control  Postural limitations    Postural Limitations  Rounded Shoulders;Forward head      ROM / Strength   AROM / PROM / Strength  AROM;Strength      AROM   Overall AROM   Deficits    AROM Assessment Site  Cervical    Cervical Flexion  65   +neck discomfort   Cervical Extension  50   +neck and arm pain   Cervical - Right Side Bend  30   +elbow pain   Cervical - Left Side Bend  40   feels more in his left arm   Cervical - Right Rotation  83    Cervical - Left Rotation  76      Strength   Overall Strength  Deficits    Overall Strength Comments  reports RUE feels weaker       Flexibility   Soft Tissue Assessment /Muscle Length  --      Palpation   Palpation comment  palpation of bil paraspinals and upper trapezius with +tight bands, no trigger points with pain radiation noted      Special Tests    Special Tests  Cervical    Cervical Tests  Spurling's;Dictraction;other      Spurling's   Findings  Negative    Side  --   bil   Comment  no increase symptoms      Distraction Test   Findngs  Positive    side  Right    Comment  symptoms lessened with distraction and returned when released      other    Findings  Positive    Comment  UE neural tension test + bil                  Objective measurements completed on examination: See above findings.    Treatment- See Pt Instructions for HEP. Patient educated in each exercise and completed 2 reps of each.          PT Education - 09/16/18 1400    Education Details  PT results, PT POC and goals of care; initiated HEP    Person(s) Educated  Patient    Methods  Explanation;Demonstration;Tactile cues;Verbal cues;Handout    Comprehension  Verbalized understanding;Returned demonstration;Verbal cues required;Tactile cues required;Need further instruction          PT Long Term Goals - 09/16/18 1411      PT LONG TERM GOAL #1   Title  Patient will be independent with HEP (Target all LTGs 10/16/2018)    Time  4    Period  Weeks    Status  New    Target Date  10/16/18      PT LONG TERM GOAL #2   Title  Patient will improve all cervical [facility] with pain rating 2/10 or less.     Time  4    Period  Weeks    Status  New      PT LONG TERM GOAL #3   Title  Patient will report not being awakened by neck pain for previous three days.     Time  4    Period  Weeks    Status  New          09/16/18 1403  Plan  Clinical Impression Statement Patient referred for OPPT due to cervical radiculopathy and RUE pain. He reports pain in LUE is intermittent and mostly not an issue, however pain in his RUE is much more intense and nearly always present. Patient with limted cervial ROM with endranges causing neck and/or RUE pain. Patient responded positively to cervical distraction and education in intial HEP. Patient  can benefit from PT to address the deficits listed below via the interventions listed below.   History and Personal Factors relevant to plan of care: PMH-depression, pinched nerve neck (mainly burning above lateral right elbow), HTN  Clinical Presentation Evolving  Clinical Presentation due to: RUE pain intensity and frequency has been increasing  Clinical Decision Making Low  Pt will  benefit from skilled therapeutic intervention in order to improve on the following deficits Decreased activity tolerance;Decreased range of motion;Increased fascial restricitons;Increased muscle spasms;Impaired flexibility;Decreased strength;Impaired UE functional use;Postural dysfunction;Pain  Rehab Potential Good  PT Frequency 2x / week  PT Duration 4 weeks  PT Treatment/Interventions ADLs/Self Care Home Management;Biofeedback;Cryotherapy;Electrical Stimulation;Ultrasound;Traction;Moist Heat;Therapeutic exercise;Neuromuscular re-education;Patient/family education;Manual techniques;Passive range of motion;Dry needling;Taping  PT Next Visit Plan Discussed may decr to 1x/wk if appropriate (and add weeks if approp); check progress with HEP given on eval; manual therapy, add to HEP  PT Home Exercise Plan [email or web address]  Consulted and Agree with Plan of Care Patient            Patient will benefit from skilled therapeutic intervention in order to improve the following deficits and impairments:  Decreased activity tolerance, Decreased range of motion, Increased fascial restricitons, Increased muscle spasms, Impaired flexibility, Decreased strength, Impaired UE functional use, Postural dysfunction, Pain  Visit Diagnosis: Cervicalgia - Plan: PT plan of care cert/re-cert  Muscle weakness (generalized) - Plan: PT plan of care cert/re-cert  Abnormal posture - Plan: PT plan of care cert/re-cert  Radiculopathy, cervical region - Plan: PT plan of care cert/re-cert     Problem List Patient Active Problem List   Diagnosis Date Noted  . Essential hypertension, benign 01/22/2016  . Hyperlipidemia 01/22/2016  . Right-sided low back pain without sciatica 04/17/2015    Zena AmosLynn P Niketa Turner, PT 09/16/2018, 2:18 PM  [facility] [address] [place], [place], [address] Phone: [phone]   Fax:  [phone]  Name: Nilda RiggsJames Pruitt MRN:  [id number] Date of Birth: 07-03-1961

## 2018-09-23 ENCOUNTER — Encounter: Payer: Self-pay | Admitting: Physical Therapy

## 2018-09-23 ENCOUNTER — Ambulatory Visit: Payer: Commercial Managed Care - PPO | Admitting: Physical Therapy

## 2018-09-23 DIAGNOSIS — M542 Cervicalgia: Secondary | ICD-10-CM

## 2018-09-23 DIAGNOSIS — R293 Abnormal posture: Secondary | ICD-10-CM

## 2018-09-23 DIAGNOSIS — M6281 Muscle weakness (generalized): Secondary | ICD-10-CM | POA: Diagnosis not present

## 2018-09-24 NOTE — Therapy (Signed)
[facility] [address] [place], [place], [address] Phone: [phone]   Fax:  [phone]  Physical Therapy Treatment  Patient Details  Name: Nilda RiggsJames Cortina MRN: [id number] Date of Birth: 02-29-1961 Referring Provider (PT): Sherren MochaShaw, Eva N, MD   Encounter Date: 09/23/2018    09/23/18 1022  PT Visits / Re-Eval  Visit Number 2  Number of Visits 9  Date for PT Re-Evaluation 10/16/18  Authorization  Authorization Type unknown at time of eval  PT Time Calculation  PT Start Time 1019  PT Stop Time 1100  PT Time Calculation (min) 41 min  PT - End of Session  Activity Tolerance Patient tolerated treatment well  Behavior During Therapy [facility] for tasks assessed/performed     Past Medical History:  Diagnosis Date  . Hyperlipidemia   . Hypertension     Past Surgical History:  Procedure Laterality Date  . FRACTURE SURGERY      There were no vitals filed for this visit.     09/23/18 1019  Symptoms/Limitations  Subjective New onset of dizzilness with lying down with the exercises, so he had been doing them sitting instead. Has had a little bit of dizziness with just lying down for bed as well, not as bad.   Pertinent History PMH-depression, pinched nerve neck (mainly burnin above lateral right elbow), HTN  Diagnostic tests 05/2017 MRI Broad-based disc protrusion at C6-7 with mild spurring causing spinal stenosis and foraminal stenosis bilaterally.  Pain Assessment  Currently in Pain? Yes  Pain Score 5  Pain Location Neck (into right shoulder/arm)  Pain Orientation Right  Pain Descriptors / Indicators Burning  Pain Type Chronic pain  Pain Onset More than a month ago  Pain Frequency Intermittent  Aggravating Factors  sleeping wrong, moving head a certain way, driving  Pain Relieving Factors certain positions- extreme shoulder abduction (holding arm out) or pulling arm across chest      09/23/18 1022  Exercises   Exercises Other Exercises  Other Exercises  supin: chin tucks for 5 sec holds x 10 reps with cues on correct technique; seated edge of mat: cervical rotation left<>Right with 5 sec holds x 5 reps each side, upper trap stretch for 30 sec's x 3 reps each side. with red theraband: rows x 10 reps, shoulder extension x 10 reps, shoulder horizontal abduction x 10 reps and alternating diagonals x 10 reps. cues on form and technique needed.   Manual Therapy  Manual Therapy Soft tissue mobilization;Manual Traction;Muscle Energy Technique  Manual therapy comments all manual therapy performed for decreased pain and muscle tightness. pt continues to repond well to manual cervical tracction. palpable trigger points noted in upper trap, rhomboid area on bil sides, right > left side.   Soft tissue mobilization to cervical paraspinals, right upper trap and rhomboid  Manual Traction gentle cervical traction for 30 sec holds for 6 reps.   Muscle Energy Technique performed concurrent with manual cervical distraction: scapular depression bil sides (pt "punching" toward feet) x 10 reps, followed by scapular retraction x 10 reps.           PT Long Term Goals - 09/16/18 1411      PT LONG TERM GOAL #1   Title  Patient will be independent with HEP (Target all LTGs 10/16/2018)    Time  4    Period  Weeks    Status  New    Target Date  10/16/18      PT LONG TERM GOAL #  2   Title  Patient will improve all cervical ROM to [facility] with pain rating 2/10 or less.     Time  4    Period  Weeks    Status  New      PT LONG TERM GOAL #3   Title  Patient will report not being awakened by neck pain for previous three days.     Time  4    Period  Weeks    Status  New         09/23/18 1022  Plan  Clinical Impression Statement Today's skilled session continued to address cervical pain/tightness and began to address strengthening. No issues reported with session. Pt did report decreased tightness after session as well.  The pt is progressing toward goals and should benefit from continued PT to progress toward unmet goals.   Pt will benefit from skilled therapeutic intervention in order to improve on the following deficits Decreased activity tolerance;Decreased range of motion;Increased fascial restricitons;Increased muscle spasms;Impaired flexibility;Decreased strength;Impaired UE functional use;Postural dysfunction;Pain  Rehab Potential Good  PT Frequency 2x / week  PT Duration 4 weeks  PT Next Visit Plan Discussed may decr to 1x/wk if appropriate (and add weeks if approp); check progress with HEP given on eval; manual therapy, add to HEP  PT Home Exercise Plan [email or web address]  Consulted and Agree with Plan of Care Patient          Patient will benefit from skilled therapeutic intervention in order to improve the following deficits and impairments:  Decreased activity tolerance, Decreased range of motion, Increased fascial restricitons, Increased muscle spasms, Impaired flexibility, Decreased strength, Impaired UE functional use, Postural dysfunction, Pain  Visit Diagnosis: Cervicalgia  Muscle weakness (generalized)  Abnormal posture     Problem List Patient Active Problem List   Diagnosis Date Noted  . Essential hypertension, benign 01/22/2016  . Hyperlipidemia 01/22/2016  . Right-sided low back pain without sciatica 04/17/2015    Sallyanne Kuster, PTA, [facility] [address] [place], [place] [address] [phone] 09/24/18, 8:35 PM   Name: Rushawn Polley MRN: [id number] Date of Birth: Jun 01, 1961

## 2018-10-01 ENCOUNTER — Other Ambulatory Visit: Payer: Self-pay | Admitting: Family Medicine

## 2018-10-01 ENCOUNTER — Ambulatory Visit: Payer: Commercial Managed Care - PPO | Attending: Family Medicine | Admitting: Physical Therapy

## 2018-10-01 ENCOUNTER — Encounter: Payer: Self-pay | Admitting: Physical Therapy

## 2018-10-01 DIAGNOSIS — M542 Cervicalgia: Secondary | ICD-10-CM

## 2018-10-01 DIAGNOSIS — M6281 Muscle weakness (generalized): Secondary | ICD-10-CM | POA: Diagnosis not present

## 2018-10-01 DIAGNOSIS — R293 Abnormal posture: Secondary | ICD-10-CM | POA: Diagnosis not present

## 2018-10-01 DIAGNOSIS — M5412 Radiculopathy, cervical region: Secondary | ICD-10-CM | POA: Diagnosis present

## 2018-10-01 NOTE — Telephone Encounter (Signed)
Requested medication (s) are due for refill today: Yes  Requested medication (s) are on the active medication list: Yes  Last refill:  09/12/18  Future visit scheduled: Yes  Notes to clinic:  Unable to refill, cannot be delegated.     Requested Prescriptions  Pending Prescriptions Disp Refills   acetaminophen-codeine (TYLENOL #3) 300-30 MG tablet [Pharmacy Med Name: ACETAMINOPHEN-COD #3 TABLET] 40 tablet 0    Sig: TAKE 1 TO 2 TABLETS BY MOUTH EVERY 6 HOURS AS NEEDED FOR PAIN     Not Delegated - Analgesics:  Opioid Agonist Combinations Failed - 10/01/2018 12:34 PM      Failed - This refill cannot be delegated      Failed - Urine Drug Screen completed in last 360 days.      Passed - Valid encounter within last 6 months    Recent Outpatient Visits          2 weeks ago Annual physical exam   [facility], Levell July, MD   2 months ago Neck pain   [facility], Gerald Stabs, PA-C   1 year ago Annual physical exam   [facility], Levell July, MD   2 years ago Annual physical exam   [facility], Levell July, MD   3 years ago Essential hypertension, benign   [facility], Levell July, MD

## 2018-10-01 NOTE — Therapy (Signed)
[facility] [address] [place], [place], [address] Phone: [phone]   Fax:  [phone]  Physical Therapy Treatment  Patient Details  Name: Scott Pruitt MRN: [id number] Date of Birth: September 02, 1961 Referring Provider (PT): Sherren MochaShaw, Eva N, MD   Encounter Date: 10/01/2018  PT End of Session - 10/01/18 0939    Visit Number  3    Number of Visits  9    Date for PT Re-Evaluation  10/16/18    Authorization Type  unknown at time of eval    PT Start Time  0935    PT Stop Time  1014    PT Time Calculation (min)  39 min    Activity Tolerance  Patient tolerated treatment well;No increased pain    Behavior During Therapy  WFL for tasks assessed/performed       Past Medical History:  Diagnosis Date  . Hyperlipidemia   . Hypertension     Past Surgical History:  Procedure Laterality Date  . FRACTURE SURGERY      There were no vitals filed for this visit.  Subjective Assessment - 10/01/18 0936    Subjective  No new complaints. Dizziness has decreased with doing the exercises sitting up, pt feels the the dizziness may have been a side effect of his pain medication.     Pertinent History  PMH-depression, pinched nerve neck (mainly burnin above lateral right elbow), HTN    Diagnostic tests  05/2017 MRI Broad-based disc protrusion at C6-7 with mild spurring causing spinal stenosis and foraminal stenosis bilaterally.    Currently in Pain?  Yes    Pain Score  5     Pain Location  Neck    Pain Orientation  Right    Pain Descriptors / Indicators  Burning    Pain Type  Chronic pain    Pain Radiating Towards  into the right shoulder/upper arm    Pain Onset  More than a month ago    Pain Frequency  Intermittent    Aggravating Factors   poor posture, driving, certain cervical movements    Pain Relieving Factors  exercises/stretches are helping some, pain medication           [facility] Adult PT Treatment/Exercise - 10/01/18 0946      Exercises   Exercises  Other Exercises    Other Exercises   standing with green theraband: scapular retraction with 5 sec holds x 10 reps, then shoulder extension for 5 sec holds x 10 reps. cues on posture, to relax shoulders and for correct hold times.       Modalities   Modalities  Ultrasound      Ultrasound   Ultrasound Location  right upper trap/rhomboid    Ultrasound Parameters  100%, 1 Mhz, 1.5 w/cm2 x 10 mintues    Ultrasound Goals  Pain      Manual Therapy   Manual Therapy  Soft tissue mobilization;Manual Traction;Muscle Energy Technique    Manual therapy comments  all manual therapy performed for decreased pain and muscle tightness. pt continues to repond well to manual cervical tracction. palpable trigger points noted in upper trap, rhomboid area on bil sides, right > left side.     Soft tissue mobilization  to cervical paraspinals, right upper trap and rhomboid    Manual Traction  gentle cervical traction for 30 sec holds for 6 reps.     Muscle Energy Technique  performed concurrent with manual cervical distraction: scapular depression bil sides (pt "punching"  toward feet) x 10 reps, followed by scapular retraction x 10 reps.            PT Long Term Goals - 09/16/18 1411      PT LONG TERM GOAL #1   Title  Patient will be independent with HEP (Target all LTGs 10/16/2018)    Time  4    Period  Weeks    Status  New    Target Date  10/16/18      PT LONG TERM GOAL #2   Title  Patient will improve all cervical ROM to [facility] with pain rating 2/10 or less.     Time  4    Period  Weeks    Status  New      PT LONG TERM GOAL #3   Title  Patient will report not being awakened by neck pain for previous three days.     Time  4    Period  Weeks    Status  New            Plan - 10/01/18 0939    Clinical Impression Statement  Today's skilled session continued to address pain reduction, decreased muscle tighness and gentle strengthening without any issues reported. Pt  stated pain 3-4/10 at end of session. The pt is progressing toward goals and should benefit from continued PT to progress toward unmet goals.     Rehab Potential  Good    PT Frequency  2x / week    PT Duration  4 weeks    PT Treatment/Interventions  ADLs/Self Care Home Management;Biofeedback;Cryotherapy;Electrical Stimulation;Ultrasound;Traction;Moist Heat;Therapeutic exercise;Neuromuscular re-education;Patient/family education;Manual techniques;Passive range of motion;Dry needling;Taping    PT Next Visit Plan  continue to work on decreasing pain/tightness, strenghening of cervical/thoracic muscles for improved posture    PT Home Exercise Plan  [email or web address]    Consulted and Agree with Plan of Care  Patient       Patient will benefit from skilled therapeutic intervention in order to improve the following deficits and impairments:  Decreased activity tolerance, Decreased range of motion, Increased fascial restricitons, Increased muscle spasms, Impaired flexibility, Decreased strength, Impaired UE functional use, Postural dysfunction, Pain  Visit Diagnosis: Cervicalgia  Muscle weakness (generalized)  Abnormal posture  Radiculopathy, cervical region     Problem List Patient Active Problem List   Diagnosis Date Noted  . Essential hypertension, benign 01/22/2016  . Hyperlipidemia 01/22/2016  . Right-sided low back pain without sciatica 04/17/2015    Sallyanne Kuster, PTA, [facility] [address], [address] [place], [place] [address] [phone] 10/01/18, 3:21 PM   Name: Scott Pruitt MRN: [id number] Date of Birth: 22-May-1961

## 2018-10-03 ENCOUNTER — Ambulatory Visit: Payer: Commercial Managed Care - PPO | Admitting: Physical Therapy

## 2018-10-03 DIAGNOSIS — M6281 Muscle weakness (generalized): Secondary | ICD-10-CM | POA: Diagnosis not present

## 2018-10-03 DIAGNOSIS — R293 Abnormal posture: Secondary | ICD-10-CM

## 2018-10-03 DIAGNOSIS — M542 Cervicalgia: Secondary | ICD-10-CM

## 2018-10-03 DIAGNOSIS — M5412 Radiculopathy, cervical region: Secondary | ICD-10-CM

## 2018-10-03 NOTE — Telephone Encounter (Signed)
Patient calling and states that he has 4 more weeks of therapy. Please advise.

## 2018-10-03 NOTE — Therapy (Signed)
[facility] [address] [place], [place], [address] Phone: [phone]   Fax:  [phone]  Physical Therapy Treatment  Patient Details  Name: Scott Pruitt MRN: [id number] Date of Birth: 1961/07/13 Referring Provider (PT): Sherren Mocha, MD   Encounter Date: 10/03/2018  PT End of Session - 10/03/18 0934    Visit Number  4    Number of Visits  9    Date for PT Re-Evaluation  10/16/18    Authorization Type  unknown at time of eval    PT Start Time  0927    PT Stop Time  1015   first 8 min on heat   PT Time Calculation (min)  48 min    Activity Tolerance  Patient tolerated treatment well;No increased pain       Past Medical History:  Diagnosis Date  . Hyperlipidemia   . Hypertension     Past Surgical History:  Procedure Laterality Date  . FRACTURE SURGERY      There were no vitals filed for this visit.  Subjective Assessment - 10/03/18 0931    Subjective  Pt relays dizziness has been okay, he did not refil medication as he feels it may have been from this. He relays some soreness and intermittent radiculopathy    Currently in Pain?  Yes    Pain Score  4     Pain Location  Neck    Pain Orientation  Right    Pain Descriptors / Indicators  Tightness;Burning    Pain Type  Chronic pain                       [facility] Adult PT Treatment/Exercise - 10/03/18 0001      Exercises   Exercises  Neck      Neck Exercises: Theraband   Shoulder Extension  20 reps;Green    Rows  20 reps;Green    Shoulder External Rotation  20 reps;Red    Shoulder External Rotation Limitations  bilat at same time    Horizontal ABduction  Red;20 reps    Horizontal ABduction Limitations  bilat at same time      Neck Exercises: Seated   Other Seated Exercise  cervical retraction with extension 2X10 with towel SNAG      Neck Exercises: Supine   Neck Retraction  20 reps      Modalities   Modalities  Moist Heat   U.S.  held per pt request as he feels it may caused soreness     Moist Heat Therapy   Number Minutes Moist Heat  8 Minutes    Moist Heat Location  Cervical   pre session     Manual Therapy   Manual Therapy  Soft tissue mobilization;Manual Traction;Joint mobilization    Joint Mobilization  C-T spine mobs central PA grade 2 30 sec X 2 each segment    Soft tissue mobilization  cervical paraspinals, and upper traps    Manual Traction  gentle cervical traction for 30 sec holds for 6 reps.       Neck Exercises: Stretches   Upper Trapezius Stretch  Right;Left;2 reps;30 seconds             PT Education - 10/03/18 1012    Education Details  added cervical extension SNAG to HEP 1-3 sets of 10 2-3 times per day    Person(s) Educated  Patient    Methods  Explanation;Demonstration;Verbal cues   denied needing handout  Comprehension  Verbalized understanding;Returned demonstration          PT Long Term Goals - 09/16/18 1411      PT LONG TERM GOAL #1   Title  Patient will be independent with HEP (Target all LTGs 10/16/2018)    Time  4    Period  Weeks    Status  New    Target Date  10/16/18      PT LONG TERM GOAL #2   Title  Patient will improve all cervical ROM to [facility] with pain rating 2/10 or less.     Time  4    Period  Weeks    Status  New      PT LONG TERM GOAL #3   Title  Patient will report not being awakened by neck pain for previous three days.     Time  4    Period  Weeks    Status  New            Plan - 10/03/18 1013    Clinical Impression Statement  Session focused on pain, neck tightness, and stiffness, and postural corrective exercises today with good tolerance. C-T spine mobilizations trialed today to decrease pain and stiffness along with continued MT for STM, and manual cervical traction. PT added cervical extension SNAG into HEP and relays less radiculopathy after session. Continue POC    Rehab Potential  Good    PT Frequency  2x / week    PT Duration   4 weeks    PT Treatment/Interventions  ADLs/Self Care Home Management;Biofeedback;Cryotherapy;Electrical Stimulation;Ultrasound;Traction;Moist Heat;Therapeutic exercise;Neuromuscular re-education;Patient/family education;Manual techniques;Passive range of motion;Dry needling;Taping    PT Next Visit Plan  what was his response to spinal mobs?, continue to work on decreasing pain/tightness, strenghening of cervical/thoracic muscles for improved posture    PT Home Exercise Plan  [email or web address]    Consulted and Agree with Plan of Care  Patient       Patient will benefit from skilled therapeutic intervention in order to improve the following deficits and impairments:  Decreased activity tolerance, Decreased range of motion, Increased fascial restricitons, Increased muscle spasms, Impaired flexibility, Decreased strength, Impaired UE functional use, Postural dysfunction, Pain  Visit Diagnosis: Cervicalgia  Muscle weakness (generalized)  Abnormal posture  Radiculopathy, cervical region     Problem List Patient Active Problem List   Diagnosis Date Noted  . Essential hypertension, benign 01/22/2016  . Hyperlipidemia 01/22/2016  . Right-sided low back pain without sciatica 04/17/2015    Birdie Riddle 10/03/2018, 10:16 AM  [facility] [address] [place], [place], [address] Phone: [phone]   Fax:  [phone]  Name: Scott Pruitt MRN: [id number] Date of Birth: December 08, 1960

## 2018-10-04 NOTE — Telephone Encounter (Signed)
Patient is requesting a refill of the following medications: Requested Prescriptions   Pending Prescriptions Disp Refills  . acetaminophen-codeine (TYLENOL #3) 300-30 MG tablet [Pharmacy Med Name: ACETAMINOPHEN-COD #3 TABLET] 40 tablet 0    Sig: TAKE 1 TO 2 TABLETS BY MOUTH EVERY 6 HOURS AS NEEDED FOR PAIN

## 2018-10-08 ENCOUNTER — Encounter: Payer: Self-pay | Admitting: Rehabilitative and Restorative Service Providers"

## 2018-10-08 ENCOUNTER — Ambulatory Visit: Payer: Commercial Managed Care - PPO | Admitting: Rehabilitative and Restorative Service Providers"

## 2018-10-08 DIAGNOSIS — M542 Cervicalgia: Secondary | ICD-10-CM | POA: Diagnosis not present

## 2018-10-08 DIAGNOSIS — R293 Abnormal posture: Secondary | ICD-10-CM | POA: Diagnosis not present

## 2018-10-08 DIAGNOSIS — M6281 Muscle weakness (generalized): Secondary | ICD-10-CM

## 2018-10-08 DIAGNOSIS — M5412 Radiculopathy, cervical region: Secondary | ICD-10-CM

## 2018-10-08 NOTE — Therapy (Signed)
[facility] [address] [place], [place], [address] Phone: [phone]   Fax:  [phone]  Physical Therapy Treatment  Patient Details  Name: Scott Pruitt MRN: [id number] Date of Birth: Jun 11, 1961 Referring Provider (PT): Sherren Mocha, MD   Encounter Date: 10/08/2018  PT End of Session - 10/08/18 1347    Visit Number  5    Number of Visits  9    Date for PT Re-Evaluation  10/16/18    Authorization Type  unknown at time of eval    PT Start Time  1238    PT Stop Time  1320    PT Time Calculation (min)  42 min    Activity Tolerance  Patient tolerated treatment well;No increased pain    Behavior During Therapy  WFL for tasks assessed/performed       Past Medical History:  Diagnosis Date  . Hyperlipidemia   . Hypertension     Past Surgical History:  Procedure Laterality Date  . FRACTURE SURGERY      There were no vitals filed for this visit.  Subjective Assessment - 10/08/18 1238    Subjective  The patient notes that he felt okay after last session, but has not had a chance to do the exercises because he got sick over the weekend.  He also hurt his left eye bending to pick something up (has a lazy eye and a stick hit eye).  He has redness.  He notes occasional R armm pain.  He was sore after the last 2 sessions.  He felt that joint mobs made him sore.    Pertinent History  PMH-depression, pinched nerve neck (mainly burnin above lateral right elbow), HTN    Diagnostic tests  05/2017 MRI Broad-based disc protrusion at C6-7 with mild spurring causing spinal stenosis and foraminal stenosis bilaterally.    Currently in Pain?  Yes    Pain Score  3     Pain Location  Arm    Pain Orientation  Right    Pain Descriptors / Indicators  Burning;Tightness    Pain Onset  More than a month ago    Aggravating Factors   driving, sleeping position    Pain Relieving Factors  stretching, exercise                        [facility] Adult PT Treatment/Exercise - 10/08/18 1348      Exercises   Exercises  Neck      Neck Exercises: Seated   Other Seated Exercise  first rib depression/rib self gliding  *see HEP.  Seated SNAGs with extension x 10 reps with cues on technique.        Neck Exercises: Supine   Other Supine Exercise  Towel roll stretch supine x 2 minutes with "T", then "V" position.        Neck Exercises: Sidelying   Other Sidelying Exercise  Chin tucks with manual resistance in R sidelying.        Manual Therapy   Manual Therapy  Soft tissue mobilization;Scapular mobilization;Joint mobilization;Manual Traction;Neural Stretch;Muscle Energy Technique    Manual therapy comments  For purposes of dec'd radiating symptoms, neural gliding, posture re-ed, and improving ROM.    Joint Mobilization  P>A upper thoracic spine mobs grade II; lower cervical grade I-II joint mobis for lateral slide    Soft tissue mobilization  cervical paraspinals    Scapular Mobilization  R scapular mobilization with parascapular muscle lengthening  Manual Traction  supine with 30 second holds x 5 reps    Muscle Energy Technique  right sidelying cervical retraction with R UE reaching towards floor for neural gliding while creating space with muscle contraction    Neural Stretch  supine and standing nerve glide R UE             PT Education - 10/08/18 1347    Education Details  modified HEP    Person(s) Educated  Patient    Methods  Explanation;Demonstration;Handout    Comprehension  Returned demonstration;Verbalized understanding          PT Long Term Goals - 09/16/18 1411      PT LONG TERM GOAL #1   Title  Patient will be independent with HEP (Target all LTGs 10/16/2018)    Time  4    Period  Weeks    Status  New    Target Date  10/16/18      PT LONG TERM GOAL #2   Title  Patient will improve all cervical ROM to [facility] with pain rating 2/10 or less.     Time  4    Period   Weeks    Status  New      PT LONG TERM GOAL #3   Title  Patient will report not being awakened by neck pain for previous three days.     Time  4    Period  Weeks    Status  New            Plan - 10/08/18 1353    Clinical Impression Statement  The patient notes he no longer has pain with end range R rotation and feels ROM improved.  * PT to measure within next week to assess.  The patient has nerve tightness noted in supine and HEP provided for standing wall stretch.  Performed self rib mobilization for HEP and manual techniques to reduce radicular symptoms.  Patient left without arm pain at end of session.    PT Treatment/Interventions  ADLs/Self Care Home Management;Biofeedback;Cryotherapy;Electrical Stimulation;Ultrasound;Traction;Moist Heat;Therapeutic exercise;Neuromuscular re-education;Patient/family education;Manual techniques;Passive range of motion;Dry needling;Taping    PT Next Visit Plan  thoracic extension for improved posture, postural strengthening, chin tucks, right neural glide techniques, postural strengthening.      Consulted and Agree with Plan of Care  Patient       Patient will benefit from skilled therapeutic intervention in order to improve the following deficits and impairments:  Decreased activity tolerance, Decreased range of motion, Increased fascial restricitons, Increased muscle spasms, Impaired flexibility, Decreased strength, Impaired UE functional use, Postural dysfunction, Pain  Visit Diagnosis: Cervicalgia  Muscle weakness (generalized)  Abnormal posture  Radiculopathy, cervical region     Problem List Patient Active Problem List   Diagnosis Date Noted  . Essential hypertension, benign 01/22/2016  . Hyperlipidemia 01/22/2016  . Right-sided low back pain without sciatica 04/17/2015    Tyler Robidoux , PT 10/08/2018, 1:55 PM  [facility] [address] [place], [place],  [address] Phone: [phone]   Fax:  [phone]  Name: Scott Pruitt MRN: [id number] Date of Birth: 27-Aug-1961

## 2018-10-08 NOTE — Patient Instructions (Addendum)
Access Code: [email or web address]  URL: [email or web address]  Date: 10/08/2018  Prepared by: Margretta Dittyhristina Chao Blazejewski   Exercises Supine Cervical Rotation AROM on Pillow - 3 reps - 1 sets - 30 hold - 2x daily - 7x weekly Supine Chin Tuck - 3 reps - 1 sets - 30 hold - 2x daily - 7x weekly Seated Gentle Upper Trapezius Stretch - 3 reps - 1 sets - 30 hold - 2x daily - 7x weekly First Rib Mobilization with Strap - 3 reps - 2 sets - 15 seconds hold - 1x daily - 7x weekly Cervical Extension AROM with Strap - 10 reps - 2 sets - 3 seconds hold - 1x daily - 7x weekly   Neurovascular: Median Nerve Glide With Cervical Bias    Stand with right arm out to side, palm flat against wall, thumb up, elbow straight. Slowly move opposite side ear toward shoulder as far as possible without pain.  Hold 5 seconds. Repeat _5_ times per set. Do __2__ sets per session. Do __1__ sessions per week.  Copyright  VHI. All rights reserved.

## 2018-10-10 ENCOUNTER — Ambulatory Visit: Payer: Commercial Managed Care - PPO | Admitting: Physical Therapy

## 2018-10-10 ENCOUNTER — Encounter: Payer: Self-pay | Admitting: Physical Therapy

## 2018-10-10 DIAGNOSIS — M6281 Muscle weakness (generalized): Secondary | ICD-10-CM | POA: Diagnosis not present

## 2018-10-10 DIAGNOSIS — M542 Cervicalgia: Secondary | ICD-10-CM

## 2018-10-10 DIAGNOSIS — R293 Abnormal posture: Secondary | ICD-10-CM

## 2018-10-12 NOTE — Therapy (Signed)
[facility] [address] [place], [place], [address] Phone: [phone]   Fax:  [phone]  Physical Therapy Treatment  Patient Details  Name: Scott RiggsJames Pruitt MRN: [id number] Date of Birth: 02-18-1961 Referring Provider (PT): Sherren MochaShaw, Eva N, MD   Encounter Date: 10/10/2018     10/10/18 0935  PT Visits / Re-Eval  Visit Number 6  Number of Visits 9  Date for PT Re-Evaluation 10/16/18  Authorization  Authorization Type unknown at time of eval  PT Time Calculation  PT Start Time 0931  PT Stop Time 1011  PT Time Calculation (min) 40 min  PT - End of Session  Activity Tolerance Patient tolerated treatment well;No increased pain  Behavior During Therapy WFL for tasks assessed/performed    Past Medical History:  Diagnosis Date  . Hyperlipidemia   . Hypertension     Past Surgical History:  Procedure Laterality Date  . FRACTURE SURGERY      There were no vitals filed for this visit.     10/10/18 0933  Symptoms/Limitations  Subjective No new complaints. Reports he feels the neck "is working itself out" with decreased pain/tightness today.   Pertinent History PMH-depression, pinched nerve neck (mainly burnin above lateral right elbow), HTN  Diagnostic tests 05/2017 MRI Broad-based disc protrusion at C6-7 with mild spurring causing spinal stenosis and foraminal stenosis bilaterally.  Pain Assessment  Currently in Pain? Yes  Pain Score 2  Pain Location Arm  Pain Orientation Right  Pain Type Chronic pain  Pain Onset More than a month ago  Pain Frequency Intermittent  Aggravating Factors  driving, sleeping positions  Pain Relieving Factors stretching, exerices      10/10/18 0950  Neck Exercises: Machines for Strengthening  UBE (Upper Arm Bike) level 1.5 x 2 minutes each fwd/bwd with cues on posture  Neck Exercises: Standing  Other Standing Exercises rolling ball up wall with flexion stretch at top x 10 reps   Other Standing Exercises with green theraband: rows, shoulder extension, shoulder horizontal abduction and alternating diagonals x 10 reps each with cues on posture/ex form.   Neck Exercises: Supine  Neck Retraction 10 reps;Limitations;5 secs  Other Supine Exercise Towel roll stretch supine x 2 minutes with "T", then "V" position.    Neck Retraction Limitations cues on correct form/technique  Moist Heat Therapy  Number Minutes Moist Heat 8 Minutes  Moist Heat Location Cervical (concurrent with part of manual therapy)  Manual Therapy  Manual Therapy Soft tissue mobilization;Myofascial release;Manual Traction;Muscle Energy Technique  Manual therapy comments all manual therapy performed for decreased pain, increased ROM and decreased radicular symptoms.   Soft tissue mobilization cervical paraspinals, upper traps and STM  Myofascial Release cervical paraspinals  Manual Traction in supine for 30 sec holds x 5 reps, plus with muscle engery technique  Muscle Energy Technique gentle cervical traction concurrent with scapular depression x 10 reps (sustained hold), then concurrent with scapular retraction x 10 reps during sustained hold. in supine on mat table.   Neck Exercises: Stretches  Upper Trapezius Stretch Right;Left;2 reps;30 seconds      PT Long Term Goals - 09/16/18 1411      PT LONG TERM GOAL #1   Title  Patient will be independent with HEP (Target all LTGs 10/16/2018)    Time  4    Period  Weeks    Status  New    Target Date  10/16/18      PT LONG TERM GOAL #2   Title  Patient will [facility] to [facility] with pain rating 2/10 or less.     Time  4    Period  Weeks    Status  New      PT LONG TERM GOAL #3   Title  Patient will report not being awakened by neck pain for previous three days.     Time  4    Period  Weeks    Status  New          10/10/18 0935  Plan  Clinical Impression Statement Today's skilled session continued to focus on decreased  pain/tightness and strengthening with pt reporting 1-2/10 pain/tightness at end of session .The pt is progressing toward goals and should benefit from continued therapy to progress toward unmet goals.   Pt will benefit from skilled therapeutic intervention in order to improve on the following deficits Decreased activity tolerance;Decreased range of motion;Increased fascial restricitons;Increased muscle spasms;Impaired flexibility;Decreased strength;Impaired UE functional use;Postural dysfunction;Pain  Rehab Potential Good  PT Frequency 2x / week  PT Duration 4 weeks  PT Treatment/Interventions ADLs/Self Care Home Management;Biofeedback;Cryotherapy;Electrical Stimulation;Ultrasound;Traction;Moist Heat;Therapeutic exercise;Neuromuscular re-education;Patient/family education;Manual techniques;Passive range of motion;Dry needling;Taping  PT Next Visit Plan thoracic extension for improved posture, postural strengthening, chin tucks, right neural glide techniques. manual techniques as needed  Consulted and Agree with Plan of Care Patient         Patient will benefit from skilled therapeutic intervention in order to improve the following deficits and impairments:  Decreased activity tolerance, Decreased range of motion, Increased fascial restricitons, Increased muscle spasms, Impaired flexibility, Decreased strength, Impaired UE functional use, Postural dysfunction, Pain  Visit Diagnosis: Cervicalgia  Muscle weakness (generalized)  Abnormal posture     Problem List Patient Active Problem List   Diagnosis Date Noted  . Essential hypertension, benign 01/22/2016  . Hyperlipidemia 01/22/2016  . Right-sided low back pain without sciatica 04/17/2015    Sallyanne KusterKathy Alleigh Mollica, PTA, [facility] [address], [address] [place], [place] [address] [phone] 10/12/18, 10:35 AM   Name: Scott RiggsJames Pruitt MRN: [id number] Date of Birth: March 06, 1961

## 2018-10-15 ENCOUNTER — Ambulatory Visit: Payer: Commercial Managed Care - PPO | Admitting: Physical Therapy

## 2018-10-15 ENCOUNTER — Encounter: Payer: Self-pay | Admitting: Physical Therapy

## 2018-10-15 DIAGNOSIS — M542 Cervicalgia: Secondary | ICD-10-CM | POA: Diagnosis not present

## 2018-10-15 DIAGNOSIS — M5412 Radiculopathy, cervical region: Secondary | ICD-10-CM

## 2018-10-15 DIAGNOSIS — R293 Abnormal posture: Secondary | ICD-10-CM | POA: Diagnosis not present

## 2018-10-15 DIAGNOSIS — M6281 Muscle weakness (generalized): Secondary | ICD-10-CM | POA: Diagnosis not present

## 2018-10-16 NOTE — Therapy (Signed)
[facility] [address] [place], [place], [address] Phone: [phone]   Fax:  [phone]  Physical Therapy Treatment  Patient Details  Name: Scott Pruitt MRN: [id number] Date of Birth: 08-22-1961 Referring Provider (PT): Sherren MochaShaw, Eva N, MD   Encounter Date: 10/15/2018  PT End of Session - 10/15/18 1104    Visit Number  7    Number of Visits  9    Date for PT Re-Evaluation  10/16/18    Authorization Type  unknown at time of eval    PT Start Time  1102    PT Stop Time  1145    PT Time Calculation (min)  43 min    Activity Tolerance  Patient tolerated treatment well;No increased pain    Behavior During Therapy  WFL for tasks assessed/performed       Past Medical History:  Diagnosis Date  . Hyperlipidemia   . Hypertension     Past Surgical History:  Procedure Laterality Date  . FRACTURE SURGERY      There were no vitals filed for this visit.  Subjective Assessment - 10/15/18 1103    Subjective  Reports the neck is better, still some burning sensations in right arm. No new falls.     Pertinent History  PMH-depression, pinched nerve neck (mainly burnin above lateral right elbow), HTN    Diagnostic tests  05/2017 MRI Broad-based disc protrusion at C6-7 with mild spurring causing spinal stenosis and foraminal stenosis bilaterally.    Currently in Pain?  Yes    Pain Score  2     Pain Location  Arm    Pain Orientation  Right    Pain Type  Chronic pain    Pain Onset  More than a month ago    Pain Frequency  Intermittent    Aggravating Factors   driving, sleeping postitions    Pain Relieving Factors  stretching, excercises              [facility] Adult PT Treatment/Exercise - 10/15/18 1105      Neck Exercises: Machines for Strengthening   UBE (Upper Arm Bike)  level 2.0 for 2 minutes each fwd/bwd with cues on posture      Neck Exercises: Standing   Other Standing Exercises  red pball: rolling ball up wall  with flexion stretch at top for 10 reps; back to ball at wall with 3# hand weights: alternating shoulder flexion, angel wings, "W", and "X<>V" x 10 reps each . cues on posture and for slow, controlled movementds.      Other Standing Exercises  with blue theraband: rows with 5 sec hold, shoulder extension, shoulder horizontal abduction, and alternating diaogals x 10 reps each with cues on posture, abd bracing and slow, controlled movements.       Neck Exercises: Supine   Other Supine Exercise  Towel roll stretch supine x 2 minutes with "T", then "V" position.        Moist Heat Therapy   Moist Heat Location  Cervical      Manual Therapy   Manual Therapy  Soft tissue mobilization;Manual Traction;Muscle Energy Technique    Manual therapy comments  all manual therapy performed for decreased pain, increased ROM and decreased radicular symptoms.     Soft tissue mobilization  cervical paraspinals, upper traps and STM    Manual Traction  in supine for 30 sec holds x 5 reps, plus with muscle engery technique    Muscle Energy Technique  gentle cervical traction concurrent with scapular depression x 10 reps (sustained hold), then concurrent with scapular retraction x 10 reps during sustained hold. in supine on mat table.       Neck Exercises: Stretches   Upper Trapezius Stretch  Right;Left;2 reps;30 seconds                  PT Long Term Goals - 09/16/18 1411      PT LONG TERM GOAL #1   Title  Patient will be independent with HEP (Target all LTGs 10/16/2018)    Time  4    Period  Weeks    Status  New    Target Date  10/16/18      PT LONG TERM GOAL #2   Title  Patient will improve all cervical ROM to [facility] with pain rating 2/10 or less.     Time  4    Period  Weeks    Status  New      PT LONG TERM GOAL #3   Title  Patient will report not being awakened by neck pain for previous three days.     Time  4    Period  Weeks    Status  New            Plan - 10/15/18 1105     Clinical Impression Statement  Today's skilled session continued to focus on decreased pain/tightness and on strengthening without any issues reported. 0-1/10 pain/tightness reported after session.     Rehab Potential  Good    PT Frequency  2x / week    PT Duration  4 weeks    PT Treatment/Interventions  ADLs/Self Care Home Management;Biofeedback;Cryotherapy;Electrical Stimulation;Ultrasound;Traction;Moist Heat;Therapeutic exercise;Neuromuscular re-education;Patient/family education;Manual techniques;Passive range of motion;Dry needling;Taping    PT Next Visit Plan  check LTGs for anticpated discharge at next visit (POC ends on 10/16/18)    Consulted and Agree with Plan of Care  Patient       Patient will benefit from skilled therapeutic intervention in order to improve the following deficits and impairments:  Decreased activity tolerance, Decreased range of motion, Increased fascial restricitons, Increased muscle spasms, Impaired flexibility, Decreased strength, Impaired UE functional use, Postural dysfunction, Pain  Visit Diagnosis: Cervicalgia  Muscle weakness (generalized)  Abnormal posture  Radiculopathy, cervical region     Problem List Patient Active Problem List   Diagnosis Date Noted  . Essential hypertension, benign 01/22/2016  . Hyperlipidemia 01/22/2016  . Right-sided low back pain without sciatica 04/17/2015    Sallyanne Kuster, PTA, [facility] Outpatient Neuro [facility] [address] [place], [place] [address] [phone] 10/16/18, 3:25 PM   Name: Scott Pruitt

## 2018-10-17 ENCOUNTER — Ambulatory Visit: Payer: Commercial Managed Care - PPO | Admitting: Rehabilitative and Restorative Service Providers"

## 2018-10-22 ENCOUNTER — Ambulatory Visit: Payer: Commercial Managed Care - PPO | Admitting: Rehabilitative and Restorative Service Providers"

## 2018-10-22 ENCOUNTER — Encounter: Payer: Self-pay | Admitting: Rehabilitative and Restorative Service Providers"

## 2018-10-22 DIAGNOSIS — M542 Cervicalgia: Secondary | ICD-10-CM

## 2018-10-22 DIAGNOSIS — R293 Abnormal posture: Secondary | ICD-10-CM | POA: Diagnosis not present

## 2018-10-22 DIAGNOSIS — M6281 Muscle weakness (generalized): Secondary | ICD-10-CM | POA: Diagnosis not present

## 2018-10-22 DIAGNOSIS — M5412 Radiculopathy, cervical region: Secondary | ICD-10-CM

## 2018-10-22 NOTE — Patient Instructions (Signed)
Access Code: [email or web address]  URL: [email or web address]  Date: 10/22/2018  Prepared by: Margretta Ditty   Exercises Seated Cervical Rotation AROM - 10 reps - 2 sets - 2x daily - 7x weekly Seated Upper Trapezius Stretch - 3 reps - 1 sets - 20 seconds hold - 2x daily - 7x weekly Seated Levator Scapulae Stretch - 3 reps - 1 sets - 20 seconds hold - 2x daily - 7x weekly Seated Cervical Retraction - 10 reps - 3 sets - 2x daily - 7x weekly First Rib Mobilization with Strap - 3 reps - 1 sets - 10 seconds hold - 1x daily - 7x weekly Cervical Extension AROM with Strap - 10 reps - 2 sets - 3 seconds hold - 1x daily - 7x weekly

## 2018-10-22 NOTE — Therapy (Addendum)
[place] [address] [place], [place], [address] Phone: [phone]   Fax:  [phone]  Physical Therapy Treatment and Discharge Summary  Patient Details  Name: Scott Pruitt MRN: [id number] Date of Birth: 1961/05/17 Referring Provider (PT): Shawnee Knapp, MD   Encounter Date: 10/22/2018  PT End of Session - 10/22/18 1114    Visit Number  8    Number of Visits  9    Authorization Type  unknown at time of eval    PT Start Time  1105    PT Stop Time  1132    PT Time Calculation (min)  27 min    Activity Tolerance  Patient tolerated treatment well;No increased pain    Behavior During Therapy  WFL for tasks assessed/performed       Past Medical History:  Diagnosis Date  . Hyperlipidemia   . Hypertension     Past Surgical History:  Procedure Laterality Date  . FRACTURE SURGERY      There were no vitals filed for this visit.  Subjective Assessment - 10/22/18 1109    Subjective  The burning in the right arm comes and goes "it's not all of the time."  Sensation in arm happens approximately 3 days per week lasting a couple of hours each time.  He is only noting occasional "tension" in the neck, but not pain.     Pertinent History  PMH-depression, pinched nerve neck (mainly burnin above lateral right elbow), HTN    Diagnostic tests  05/2017 MRI Broad-based disc protrusion at C6-7 with mild spurring causing spinal stenosis and foraminal stenosis bilaterally.    Currently in Pain?  No/denies                       [facility] Adult PT Treatment/Exercise - 10/22/18 1143      Exercises   Exercises  Other Exercises    Other Exercises   Reviewed all home exercises and  removed supine positioning exercises due to patient usually up during the day.  Modified the upper trap and added levator stretch, moved AROM from supine to sitting for rotation, moved chin tucks from supine to sitting, and performed seated towel  and strap mobilization for c-spine.  Patient is able to perform all HEp and notes no neck discomfort at this time.             PT Education - 10/22/18 1129    Education Details  updated HEP    Person(s) Educated  Patient    Methods  Explanation;Demonstration;Handout    Comprehension  Returned demonstration;Verbalized understanding          PT Long Term Goals - 10/22/18 1130      PT LONG TERM GOAL #1   Title  Patient will be independent with HEP (Target all LTGs 10/16/2018)    Time  4    Period  Weeks    Status  Achieved      PT LONG TERM GOAL #2   Title  Patient will improve all cervical ROM to [facility] with pain rating 2/10 or less.     Time  4    Period  Weeks    Status  Achieved      PT LONG TERM GOAL #3   Title  Patient will report not being awakened by neck pain for previous three days.     Time  4    Period  Weeks    Status  Achieved            Plan - 10/22/18 1142    Clinical Impression Statement  The patient met 3/3 LTGs.  He reports no neck pain, but continues to have intermittent R arm (posterior aspect superior to elbow) numbness approximately 3 days/week for 2 hours.  He is tolerating HEP well and feels reduced neck stiffness.  PT recommended he continue HEP with goal of continuing to reduce radicular symptoms.      PT Treatment/Interventions  ADLs/Self Care Home Management;Biofeedback;Cryotherapy;Electrical Stimulation;Ultrasound;Traction;Moist Heat;Therapeutic exercise;Neuromuscular re-education;Patient/family education;Manual techniques;Passive range of motion;Dry needling;Taping    PT Next Visit Plan  d/c    Consulted and Agree with Plan of Care  Patient       Patient will benefit from skilled therapeutic intervention in order to improve the following deficits and impairments:  Decreased activity tolerance, Decreased range of motion, Increased fascial restricitons, Increased muscle spasms, Impaired flexibility, Decreased strength, Impaired UE  functional use, Postural dysfunction, Pain  Visit Diagnosis: Cervicalgia  Muscle weakness (generalized)  Abnormal posture  Radiculopathy, cervical region    PHYSICAL THERAPY DISCHARGE SUMMARY  Visits from Start of Care: 8  Current functional level related to goals / functional outcomes: See above.   Remaining deficits: Intermittent R elbow numbness, tingling   Education / Equipment: Home program for neck stabilization, stretching.  Plan: Patient agrees to discharge.  Patient goals were met. Patient is being discharged due to meeting the stated rehab goals.  ?????         Thank you for the referral of this patient. Rudell Cobb, MPT   [place], PT 10/22/2018, 11:44 AM  [facility] [address] [place], [place], [address] Phone: [phone]   Fax:  [phone]  Name: Erubiel Manasco MRN: [id number] Date of Birth: 1961-08-07

## 2018-10-24 ENCOUNTER — Ambulatory Visit: Payer: Commercial Managed Care - PPO | Admitting: Physical Therapy

## 2018-11-17 ENCOUNTER — Other Ambulatory Visit: Payer: Self-pay | Admitting: Family Medicine

## 2018-11-17 MED ORDER — PRAVASTATIN SODIUM 40 MG PO TABS: ORAL_TABLET | ORAL | 3 refills | Status: DC

## 2018-11-17 NOTE — Telephone Encounter (Signed)
Copied from CRM [id number]. Topic: Quick Communication - Rx Refill/Question >> Nov 17, 2018  8:24 AM Maye Hides wrote: Medication:pravastatin (PRAVACHOL) 40 MG tablet   Has the patient contacted their pharmacy? {yes   (Agent: If yes, when and what did the pharmacy advise ~~Advised to contact office,no refills  Preferred Pharmacy (with phone number or street name): [id number] [place], [place] - [address] [phone] (Phone) [phone] (Fax)    Agent: Please be advised that RX refills may take up to 3 business days. We ask that you follow-up with your pharmacy.

## 2020-01-18 ENCOUNTER — Telehealth: Payer: Self-pay | Admitting: Family Medicine

## 2020-01-18 DIAGNOSIS — I10 Essential (primary) hypertension: Secondary | ICD-10-CM

## 2020-01-18 NOTE — Telephone Encounter (Signed)
Requested medication (s) are due for refill today:  Pravachol-yes; Zestoretic 20-25-yes;    Requested medication (s) are on the active medication list:   Yes for both  Future visit scheduled:   No   Last ordered: Pravachol 11/17/2018  #90 3 refills;    Zestoretic 20-25  09/12/1018  #90  3 refills.  Clinic note:  He has not been seen since Delman Cheadle left.    Needs another provider assigned.     Requested Prescriptions  Pending Prescriptions Disp Refills   pravastatin (PRAVACHOL) 40 MG tablet 90 tablet 3    Sig: TAKE 1 TABLET BY MOUTH EVERYDAY AT BEDTIME      Cardiovascular:  Antilipid - Statins Failed - 01/18/2020  2:25 PM      Failed - Total Cholesterol in normal range and within 360 days    Cholesterol, Total  Date Value Ref Range Status  09/12/2018 178 100 - 199 mg/dL Final          Failed - LDL in normal range and within 360 days    LDL Calculated  Date Value Ref Range Status  09/12/2018 107 (H) 0 - 99 mg/dL Final          Failed - HDL in normal range and within 360 days    HDL  Date Value Ref Range Status  09/12/2018 58 >39 mg/dL Final          Failed - Triglycerides in normal range and within 360 days    Triglycerides  Date Value Ref Range Status  09/12/2018 66 0 - 149 mg/dL Final          Failed - Valid encounter within last 12 months    Recent Outpatient Visits           1 year ago Annual physical exam   [facility], Laurey Arrow, MD   1 year ago Neck pain   [facility], Reather Laurence, PA-C   2 years ago Annual physical exam   [facility], Laurey Arrow, MD   4 years ago Annual physical exam   [facility], Laurey Arrow, MD   4 years ago Essential hypertension, benign   [facility], Laurey Arrow, MD              Passed - Patient is not pregnant        lisinopril-hydrochlorothiazide (ZESTORETIC) 20-25 MG tablet 90 tablet 3    Sig: Take 1 tablet by mouth daily.      Cardiovascular:  ACEI +  Diuretic Combos Failed - 01/18/2020  2:25 PM      Failed - Na in normal range and within 180 days    Sodium  Date Value Ref Range Status  09/12/2018 141 134 - 144 mmol/L Final          Failed - K in normal range and within 180 days    Potassium  Date Value Ref Range Status  09/12/2018 4.2 3.5 - 5.2 mmol/L Final          Failed - Cr in normal range and within 180 days    Creat  Date Value Ref Range Status  12/29/2015 0.99 0.70 - 1.33 mg/dL Final   Creatinine, Ser  Date Value Ref Range Status  09/12/2018 1.03 0.76 - 1.27 mg/dL Final          Failed - Ca in normal range and within 180 days  Calcium  Date Value Ref Range Status  09/12/2018 9.9 8.7 - 10.2 mg/dL Final          Failed - Valid encounter within last 6 months    Recent Outpatient Visits           1 year ago Annual physical exam   [facility], Levell July, MD   1 year ago Neck pain   [facility], Gerald Stabs, PA-C   2 years ago Annual physical exam   [facility], Levell July, MD   4 years ago Annual physical exam   [facility], Levell July, MD   4 years ago Essential hypertension, benign   [facility], Levell July, MD              Passed - Patient is not pregnant      Passed - Last BP in normal range    BP Readings from Last 1 Encounters:  09/12/18 134/84

## 2020-01-18 NOTE — Telephone Encounter (Signed)
Medication Refill - Medication:  lisinopril-hydrochlorothiazide (PRINZIDE,ZESTORETIC) 20-25 MG tablet [[id number]  pravastatin (PRAVACHOL) 40 MG tablet [[id number]]    Preferred Pharmacy (with phone number or street name):  [id number] [place], [place] - [address]  [address] [place] [address]  Phone: [phone] Fax: [phone]    Agent: Please be advised that RX refills may take up to 3 business days. We ask that you follow-up with your pharmacy.

## 2020-01-22 ENCOUNTER — Other Ambulatory Visit: Payer: Self-pay

## 2020-01-22 MED ORDER — PRAVASTATIN SODIUM 40 MG PO TABS: ORAL_TABLET | ORAL | 3 refills | Status: DC

## 2020-01-22 NOTE — Telephone Encounter (Signed)
Patient is calling to check on the status of medication refill. Please advise CB- [phone]

## 2020-01-22 NOTE — Telephone Encounter (Signed)
Called pt told him I sent in on more months supply but we had to see him before we would supply any more

## 2020-03-10 ENCOUNTER — Other Ambulatory Visit: Payer: Self-pay

## 2020-03-11 ENCOUNTER — Ambulatory Visit (INDEPENDENT_AMBULATORY_CARE_PROVIDER_SITE_OTHER): Payer: Commercial Managed Care - PPO | Admitting: Family Medicine

## 2020-03-11 ENCOUNTER — Encounter: Payer: Self-pay | Admitting: Family Medicine

## 2020-03-11 VITALS — BP 148/96 | HR 70 | Temp 97.9°F | Ht 70.0 in | Wt 151.0 lb

## 2020-03-11 DIAGNOSIS — I10 Essential (primary) hypertension: Secondary | ICD-10-CM

## 2020-03-11 DIAGNOSIS — F322 Major depressive disorder, single episode, severe without psychotic features: Secondary | ICD-10-CM | POA: Insufficient documentation

## 2020-03-11 DIAGNOSIS — F17201 Nicotine dependence, unspecified, in remission: Secondary | ICD-10-CM | POA: Diagnosis not present

## 2020-03-11 DIAGNOSIS — Z0000 Encounter for general adult medical examination without abnormal findings: Secondary | ICD-10-CM | POA: Insufficient documentation

## 2020-03-11 DIAGNOSIS — E7800 Pure hypercholesterolemia, unspecified: Secondary | ICD-10-CM | POA: Diagnosis not present

## 2020-03-11 MED ORDER — LISINOPRIL-HYDROCHLOROTHIAZIDE 20-25 MG PO TABS: 1.0000 | ORAL_TABLET | Freq: Every day | ORAL | 1 refills | Status: DC

## 2020-03-11 MED ORDER — MIRTAZAPINE 15 MG PO TABS: 15.0000 mg | ORAL_TABLET | Freq: Every day | ORAL | 1 refills | Status: DC

## 2020-03-11 NOTE — Patient Instructions (Addendum)
Major Depressive Disorder, Adult Major depressive disorder (MDD) is a mental health condition. It may also be called clinical depression or unipolar depression. MDD usually causes feelings of sadness, hopelessness, or helplessness. MDD can also cause physical symptoms. It can interfere with work, school, relationships, and other everyday activities. MDD may be mild, moderate, or severe. It may occur once (single episode major depressive disorder) or it may occur multiple times (recurrent major depressive disorder). What are the causes? The exact cause of this condition is not known. MDD is most likely caused by a combination of things, which may include:  Genetic factors. These are traits that are passed along from parent to child.  Individual factors. Your personality, your behavior, and the way you handle your thoughts and feelings may contribute to MDD. This includes personality traits and behaviors learned from others.  Physical factors, such as: ? Differences in the part of your brain that controls emotion. This part of your brain may be different than it is in people who do not have MDD. ? Long-term (chronic) medical or psychiatric illnesses.  Social factors. Traumatic experiences or major life changes may play a role in the development of MDD. What increases the risk? This condition is more likely to develop in women. The following factors may also make you more likely to develop MDD:  A family history of depression.  Troubled family relationships.  Abnormally low levels of certain brain chemicals.  Traumatic events in childhood, especially abuse or the loss of a parent.  Being under a lot of stress, or long-term stress, especially from upsetting life experiences or losses.  A history of: ? Chronic physical illness. ? Other mental health disorders. ? Substance abuse.  Poor living conditions.  Experiencing social exclusion or discrimination on a regular basis. What are the  signs or symptoms? The main symptoms of MDD typically include:  Constant depressed or irritable mood.  Loss of interest in things and activities. MDD symptoms may also include:  Sleeping or eating too much or too little.  Unexplained weight change.  Fatigue or low energy.  Feelings of worthlessness or guilt.  Difficulty thinking clearly or making decisions.  Thoughts of suicide or of harming others.  Physical agitation or weakness.  Isolation. Severe cases of MDD may also occur with other symptoms, such as:  Delusions or hallucinations, in which you imagine things that are not real (psychotic depression).  Low-level depression that lasts at least a year (chronic depression or persistent depressive disorder).  Extreme sadness and hopelessness (melancholic depression).  Trouble speaking and moving (catatonic depression). How is this diagnosed? This condition may be diagnosed based on:  Your symptoms.  Your medical history, including your mental health history. This may involve tests to evaluate your mental health. You may be asked questions about your lifestyle, including any drug and alcohol use, and how long you have had symptoms of MDD.  A physical exam.  Blood tests to rule out other conditions. You must have a depressed mood and at least four other MDD symptoms most of the day, nearly every day in the same 2-week timeframe before your health care provider can confirm a diagnosis of MDD. How is this treated? This condition is usually treated by mental health professionals, such as psychologists, psychiatrists, and clinical social workers. You may need more than one type of treatment. Treatment may include:  Psychotherapy. This is also called talk therapy or counseling. Types of psychotherapy include: ? Cognitive behavioral therapy (CBT). This type of therapy   teaches you to recognize unhealthy feelings, thoughts, and behaviors, and replace them with positive thoughts  and actions. ? Interpersonal therapy (IPT). This helps you to improve the way you relate to and communicate with others. ? Family therapy. This treatment includes members of your family.  Medicine to treat anxiety and depression, or to help you control certain emotions and behaviors.  Lifestyle changes, such as: ? Limiting alcohol and drug use. ? Exercising regularly. ? Getting plenty of sleep. ? Making healthy eating choices. ? Spending more time outdoors.  Treatments involving stimulation of the brain can be used in situations with extremely severe symptoms, or when medicine or other therapies do not work over time. These treatments include electroconvulsive therapy, transcranial magnetic stimulation, and vagal nerve stimulation. Follow these instructions at home: Activity  Return to your normal activities as told by your health care provider.  Exercise regularly and spend time outdoors as told by your health care provider. General instructions  Take over-the-counter and prescription medicines only as told by your health care provider.  Do not drink alcohol. If you drink alcohol, limit your alcohol intake to no more than 1 drink a day for nonpregnant women and 2 drinks a day for men. One drink equals 12 oz of beer, 5 oz of wine, or 1 oz of hard liquor. Alcohol can affect any antidepressant medicines you are taking. Talk to your health care provider about your alcohol use.  Eat a healthy diet and get plenty of sleep.  Find activities that you enjoy doing, and make time to do them.  Consider joining a support group. Your health care provider may be able to recommend a support group.  Keep all follow-up visits as told by your health care provider. This is important. Where to find more information National Alliance on Mental Illness  www.nami.org [place] National Institute of Mental Health  www.nimh.nih.gov National Suicide Prevention Lifeline  1-800-273-TALK (8255). This is  free, 24-hour help. Contact a health care provider if:  Your symptoms get worse.  You develop new symptoms. Get help right away if:  You self-harm.  You have serious thoughts about hurting yourself or others.  You see, hear, taste, smell, or feel things that are not present (hallucinate). This information is not intended to replace advice given to you by your health care provider. Make sure you discuss any questions you have with your health care provider. Document Revised: 08/23/2017 Document Reviewed: 03/21/2016 Elsevier Patient Education  2020 Elsevier [employer]. Mirtazapine tablets What is this medicine? MIRTAZAPINE (mir TAZ a peen) is used to treat depression. This medicine may be used for other purposes; ask your health care provider or pharmacist if you have questions. COMMON BRAND NAME(S): Remeron What should I tell my health care provider before I take this medicine? They need to know if you have any of these conditions:  bipolar disorder  glaucoma  kidney disease  liver disease  suicidal thoughts  an unusual or allergic reaction to mirtazapine, other medicines, foods, dyes, or preservatives  pregnant or trying to get pregnant  breast-feeding How should I use this medicine? Take this medicine by mouth with a glass of water. Follow the directions on the prescription label. Take your medicine at regular intervals. Do not take your medicine more often than directed. Do not stop taking this medicine suddenly except upon the advice of your doctor. Stopping this medicine too quickly may cause serious side effects or your condition may worsen. A special MedGuide will be given to you   by the pharmacist with each prescription and refill. Be sure to read this information carefully each time. Talk to your pediatrician regarding the use of this medicine in children. Special care may be needed. Overdosage: If you think you have taken too much of this medicine contact a poison control  center or emergency room at once. NOTE: This medicine is only for you. Do not share this medicine with others. What if I miss a dose? If you miss a dose, take it as soon as you can. If it is almost time for your next dose, take only that dose. Do not take double or extra doses. What may interact with this medicine? Do not take this medicine with any of the following medications:  linezolid  MAOIs like Carbex, Eldepryl, Marplan, Nardil, and Parnate  methylene blue (injected into a vein) This medicine may also interact with the following medications:  alcohol  antiviral medicines for HIV or AIDS  certain medicines that treat or prevent blood clots like warfarin  certain medicines for depression, anxiety, or psychotic disturbances  certain medicines for fungal infections like ketoconazole and itraconazole  certain medicines for migraine headache like almotriptan, eletriptan, frovatriptan, naratriptan, rizatriptan, sumatriptan, zolmitriptan  certain medicines for seizures like carbamazepine or phenytoin  certain medicines for sleep  cimetidine  erythromycin  fentanyl  lithium  medicines for blood pressure  nefazodone  rasagiline  rifampin  supplements like St. John's wort, kava kava, valerian  tramadol  tryptophan This list may not describe all possible interactions. Give your health care provider a list of all the medicines, herbs, non-prescription drugs, or dietary supplements you use. Also tell them if you smoke, drink alcohol, or use illegal drugs. Some items may interact with your medicine. What should I watch for while using this medicine? Tell your doctor if your symptoms do not get better or if they get worse. Visit your doctor or health care professional for regular checks on your progress. Because it may take several weeks to see the full effects of this medicine, it is important to continue your treatment as prescribed by your doctor. Patients and their  families should watch out for new or worsening thoughts of suicide or depression. Also watch out for sudden changes in feelings such as feeling anxious, agitated, panicky, irritable, hostile, aggressive, impulsive, severely restless, overly excited and hyperactive, or not being able to sleep. If this happens, especially at the beginning of treatment or after a change in dose, call your health care professional. You may get drowsy or dizzy. Do not drive, use machinery, or do anything that needs mental alertness until you know how this medicine affects you. Do not stand or sit up quickly, especially if you are an older patient. This reduces the risk of dizzy or fainting spells. Alcohol may interfere with the effect of this medicine. Avoid alcoholic drinks. This medicine may cause dry eyes and blurred vision. If you wear contact lenses you may feel some discomfort. Lubricating drops may help. See your eye doctor if the problem does not go away or is severe. Your mouth may get dry. Chewing sugarless gum or sucking hard candy, and drinking plenty of water may help. Contact your doctor if the problem does not go away or is severe. What side effects may I notice from receiving this medicine? Side effects that you should report to your doctor or health care professional as soon as possible:  allergic reactions like skin rash, itching or hives, swelling of the face, lips, or   tongue  anxious  changes in vision  chest pain  confusion  elevated mood, decreased need for sleep, racing thoughts, impulsive behavior  eye pain  fast, irregular heartbeat  feeling faint or lightheaded, falls  feeling agitated, angry, or irritable  fever or chills, sore throat  hallucination, loss of contact with reality  loss of balance or coordination  mouth sores  redness, blistering, peeling or loosening of the skin, including inside the mouth  restlessness, pacing, inability to keep still  seizures  stiff  muscles  suicidal thoughts or other mood changes  trouble passing urine or change in the amount of urine  trouble sleeping  unusual bleeding or bruising  unusually weak or tired  vomiting Side effects that usually do not require medical attention (report to your doctor or health care professional if they continue or are bothersome):  change in appetite  constipation  dizziness  dry mouth  muscle aches or pains  nausea  tired  weight gain This list may not describe all possible side effects. Call your doctor for medical advice about side effects. You may report side effects to FDA at 1-800-FDA-1088. Where should I keep my medicine? Keep out of the reach of children. Store at room temperature between 15 and 30 degrees C (59 and 86 degrees F) Protect from light and moisture. Throw away any unused medicine after the expiration date. NOTE: This sheet is a summary. It may not cover all possible information. If you have questions about this medicine, talk to your doctor, pharmacist, or health care provider.  2020 Elsevier/Gold Standard (2016-02-09 17:30:45)  
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
## 2020-03-14 ENCOUNTER — Telehealth: Payer: Self-pay | Admitting: General Practice

## 2020-03-18 NOTE — Telephone Encounter (Signed)
ERROR

## 2020-04-02 ENCOUNTER — Other Ambulatory Visit: Payer: Self-pay | Admitting: Family Medicine

## 2020-04-02 DIAGNOSIS — F322 Major depressive disorder, single episode, severe without psychotic features: Secondary | ICD-10-CM

## 2020-05-05 ENCOUNTER — Other Ambulatory Visit: Payer: Self-pay | Admitting: Family Medicine

## 2020-05-05 DIAGNOSIS — F322 Major depressive disorder, single episode, severe without psychotic features: Secondary | ICD-10-CM

## 2020-09-22 ENCOUNTER — Other Ambulatory Visit: Payer: Self-pay | Admitting: Family Medicine

## 2020-09-22 DIAGNOSIS — I10 Essential (primary) hypertension: Secondary | ICD-10-CM

## 2020-09-22 NOTE — Telephone Encounter (Signed)
Last Ov 03/11/20 Last fill 03/11/20  #90/1

## 2023-01-27 ENCOUNTER — Ambulatory Visit (HOSPITAL_COMMUNITY)
Admission: EM | Admit: 2023-01-27 | Discharge: 2023-01-27 | Disposition: A | Discharge status: Other Acute Inpatient Hospital | Payer: No Typology Code available for payment source

## 2023-01-27 ENCOUNTER — Emergency Department (HOSPITAL_COMMUNITY)
Admission: EM | Admit: 2023-01-27 | Discharge: 2023-01-27 | Disposition: A | Discharge status: Home / Self Care | Payer: No Typology Code available for payment source | Attending: Emergency Medicine | Admitting: Emergency Medicine

## 2023-01-27 ENCOUNTER — Other Ambulatory Visit: Payer: Self-pay

## 2023-01-27 ENCOUNTER — Encounter (HOSPITAL_COMMUNITY): Payer: Self-pay

## 2023-01-27 ENCOUNTER — Ambulatory Visit (HOSPITAL_COMMUNITY)
Admission: EM | Admit: 2023-01-27 | Discharge: 2023-01-27 | Disposition: A | Discharge status: Home / Self Care | Payer: No Typology Code available for payment source

## 2023-01-27 DIAGNOSIS — I10 Essential (primary) hypertension: Secondary | ICD-10-CM

## 2023-01-27 DIAGNOSIS — Z1330 Encounter for screening examination for mental health and behavioral disorders, unspecified: Secondary | ICD-10-CM | POA: Diagnosis not present

## 2023-01-27 MED ORDER — AMLODIPINE BESYLATE 5 MG PO TABS
10.0000 mg | ORAL_TABLET | Freq: Once | ORAL | Status: DC
  Filled 2023-01-27: qty 2

## 2023-01-27 MED ORDER — AMLODIPINE BESYLATE 10 MG PO TABS: 10.0000 mg | ORAL_TABLET | Freq: Every day | ORAL | 2 refills | Status: DC

## 2023-01-27 NOTE — Progress Notes (Signed)
   01/27/23 1656  [facility] Triage Screening (Walk-ins at [facility] only)  How Did You Hear About [place]? Legal System  What Is the Reason for Your Visit/Call Today? Pt presents to [facility] voluntarily escorted by [employer]. Pt was sent to [facility] for medical clearance due to hypertension. Pt presented to [facility] due to family discord. Pt is oriented x4. Pt denies SI/HI and AVH.  How Long Has This Been Causing You Problems? <Week  Have You Recently Had Any Thoughts About Hurting Yourself? No  Are You Planning to Commit Suicide/Harm Yourself At This time? No  Have you Recently Had Thoughts About Hurting Someone [name]? No  Are You Planning To Harm Someone At This Time? No  Are you currently experiencing any auditory, visual or other hallucinations? No  Have You Used Any Alcohol or Drugs in the Past 24 Hours? No  Do you have any current medical co-morbidities that require immediate attention? No  Clinician description of patient physical appearance/behavior: calm, cooperative  What Do You Feel Would Help You the Most Today? Stress Management;Social Support  If access to [facility] Ur[facility] was not available, would you have sought care in the Emergency Department? No  Determination of Need Routine (7 days)  Options For Referral Outpatient Therapy;Medication Management
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
## 2023-01-27 NOTE — ED Provider Notes (Signed)
[place] EMERGENCY DEPARTMENT AT [facility] Provider Note   CSN: [id number] Arrival date & time: 01/27/23  1502     History  Chief Complaint  Patient presents with   Medical Clearance    Scott Pruitt is a 62 y.o. male with past medical history hypertension and hyperlipidemia who presents to the ED for medical clearance from [facility].  Patient not currently on any antihypertensive medications.  He states in the past he was told he had high blood pressure but was never started on medication.  He denies chest pain, shortness of breath, dizziness, syncope, abdominal pain, nausea, vomiting, diarrhea, focal weakness, vision changes, or any concerns today.  He states that otherwise he feels in his baseline state of health.  He is agreeable to starting hypertension medication.      Home Medications Prior to Admission medications   Medication Sig Start Date End Date Taking? Authorizing Provider  amLODipine (NORVASC) 10 MG tablet Take 1 tablet (10 mg total) by mouth daily. 01/27/23 04/27/23 Yes Fatuma Dowers L, PA-C  acetaminophen-codeine (TYLENOL #3) 300-30 MG tablet Take 1-2 tablets by mouth every 6 (six) hours as needed for moderate pain. 09/12/18   Sherren Mocha, MD  aspirin 81 MG tablet Take 81 mg by mouth daily.    [provider]  Ibuprofen-Famotidine (DUEXIS) 800-26.6 MG TABS Take 1 tablet by mouth daily. Patient not taking: Reported on 09/16/2018 06/05/17   Cammy Copa, MD  lisinopril-hydrochlorothiazide (ZESTORETIC) 20-25 MG tablet TAKE 1 TABLET BY MOUTH EVERY DAY 09/22/20   Mliss Sax, MD  mirtazapine (REMERON) 15 MG tablet TAKE 1 TABLET BY MOUTH EVERYDAY AT BEDTIME 05/05/20   Mliss Sax, MD  pravastatin (PRAVACHOL) 40 MG tablet TAKE 1 TABLET BY MOUTH EVERYDAY AT BEDTIME 01/22/20   Collie Siad A, MD  predniSONE (DELTASONE) 20 MG tablet Take 3 PO QAM x3days, 2 PO QAM x3days, 1 PO QAM x3days Patient not taking: Reported on 03/11/2020 09/12/18    Sherren Mocha, MD  Specialty Vitamins Products (PROSTATE PO) Take by mouth.    [provider]      Allergies    Patient has no known allergies.    Review of Systems   Review of Systems  All other systems reviewed and are negative.   Physical Exam Updated Vital Signs BP (!) 175/105 (BP Location: Right Arm)   Pulse 76   Temp 98 F (36.7 C) (Oral)   Resp 18   Wt 70.3 kg   SpO2 99%   BMI 22.24 kg/m  Physical Exam Vitals and nursing note reviewed.  Constitutional:      General: He is not in acute distress.    Appearance: Normal appearance. He is not ill-appearing, toxic-appearing or diaphoretic.  HENT:     Head: Normocephalic and atraumatic.     Mouth/Throat:     Mouth: Mucous membranes are moist.  Eyes:     Conjunctiva/sclera: Conjunctivae normal.  Cardiovascular:     Rate and Rhythm: Normal rate and regular rhythm.     Heart sounds: No murmur heard. Pulmonary:     Effort: Pulmonary effort is normal. No respiratory distress.     Breath sounds: Normal breath sounds. No stridor. No wheezing, rhonchi or rales.  Abdominal:     General: Abdomen is flat. There is no distension.     Palpations: Abdomen is soft.     Tenderness: There is no abdominal tenderness. There is no guarding or rebound.  Musculoskeletal:  General: Normal range of motion.     Cervical back: Normal range of motion and neck supple. No rigidity.     Right lower leg: No edema.     Left lower leg: No edema.  Skin:    General: Skin is warm and dry.     Capillary Refill: Capillary refill takes less than 2 seconds.     Coloration: Skin is not jaundiced or pale.     Findings: No rash.  Neurological:     Mental Status: He is alert and oriented to person, place, and time. Mental status is at baseline.     GCS: GCS eye subscore is 4. GCS verbal subscore is 5. GCS motor subscore is 6.     Cranial Nerves: No dysarthria or facial asymmetry.     Comments: 5/5 strength to bilateral upper and lower  extremities, patient has strabismus at baseline but otherwise nonfocal neuroexam  Psychiatric:        Mood and Affect: Mood normal.        Behavior: Behavior normal.     ED Results / Procedures / Treatments   Labs (all labs ordered are listed, but only abnormal results are displayed) Labs Reviewed - No data to display  EKG None  Radiology No results found.  Procedures Procedures    Medications Ordered in ED Medications  amLODipine (NORVASC) tablet 10 mg (has no administration in time range)    ED Course/ Medical Decision Making/ A&P                             Medical Decision Making Risk Prescription drug management.   Medical Decision Making:   Daulton Britts is a 62 y.o. male who presented to the ED today with asymptomatic hypertension detailed above.    Patient's presentation is complicated by their history of current behavioral health patient.  Complete initial physical exam performed, notably the patient was neurologically intact.  Regular rate and rhythm.  Lungs clear to auscultation.  Abdomen soft and nontender.  Fairly unremarkable exam.  Patient hypertensive but otherwise vital signs stable.    Reviewed and confirmed nursing documentation for past medical history, family history, social history.    Initial Assessment:   With the patient's presentation, differential diagnosis includes but is not limited to asymptomatic versus symptomatic hypertension, ACS, hypertensive crisis, CVA, renal failure. This is most consistent with an acute complicated illness   Final Assessment and Plan:   62 year old male presents to the ED for evaluation of hypertension.  Chronic history of hypertension currently unmedicated.  Patient asymptomatic.  He has a BP today at 175/105 but otherwise vital signs are stable.  Neurologically intact.  No complaints today.  Patient current patient at behavioral health and sent here for medical clearance.  Patient without any acute complaints and  does not desire further workup in the office of believe this is not indicated as he is asymptomatic and in his baseline state of health.  I will start him on antihypertensives which he is agreeable to take and discharge him back to [facility] with prescription and primary care follow up.  Strict ED return precautions given, all questions answered, and stable for discharge.   Clinical Impression:  1. Asymptomatic hypertension      Discharge           Final Clinical Impression(s) / ED Diagnoses Final diagnoses:  Asymptomatic hypertension    Rx / DC Orders ED Discharge Orders  Ordered    amLODipine (NORVASC) 10 MG tablet  Daily        01/27/23 1614              Tonette Lederer, PA-C 01/27/23 1623    Derwood Kaplan, MD 01/27/23 2023

## 2023-01-27 NOTE — ED Triage Notes (Signed)
Pt arrives with [employer] for medical clearance to go to [facility]. Pt taken to [facility] for mental evaluation and BP was elevated so they sent him over here for medical clearance. Pt denies SI/HI. Per [employer], pt can return to [facility] once medically cleared.

## 2023-01-27 NOTE — Discharge Instructions (Addendum)
Thank you for letting us take care of you today.  I am starting you on a medication called amlodipine to help control your blood pressure.  Please take this daily as prescribed.  I recommend obtaining a blood pressure monitor for home and taking your blood pressure daily at the same time and keeping a log of this.  I attached instructions on how to do so.  Please follow-up with your PCP within the week for reevaluation of your blood pressure and further medication management.  For any new or worsening symptoms such as chest pain, shortness of breath, vomiting, severe headache, loss of consciousness, weakness on one side of your body, vision changes, or other new, concerning symptoms, please return to the nearest emergency department for reevaluation.  Patient medically cleared to return to [facility].  He has asymptomatic hypertension and is neurologically intact.  Will start him on medication for hypertension treatment and have him follow-up with primary care.

## 2023-01-27 NOTE — ED Notes (Signed)
Scott Pruitt charge states that she is aware that the patient is coming per the nursing secretary that answered that phone. Charge did not come to phone for report.

## 2023-01-27 NOTE — ED Notes (Signed)
Rn tired to calling [place] ed several times to give report on patient . Providers is sending the patient to the ed  for medical clearance .Phone was picked up several times and hung up without anyone answering it.
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
## 2023-01-27 NOTE — Discharge Instructions (Addendum)
  Medication management walk-ins:  Monday to Friday: 8 AM to 11 AM.  It is recommended that patients arrive by 7:30 AM to 7:45 AM because patients will be seen in the order of arrival.  Go to the second floor on arrival and check in.  **Availability is limited; therefore, patients may not be seen on the same day.**

## 2023-02-06 ENCOUNTER — Ambulatory Visit (HOSPITAL_COMMUNITY)
Admission: EM | Admit: 2023-02-06 | Discharge: 2023-02-06 | Disposition: A | Discharge status: Home / Self Care | Payer: No Typology Code available for payment source | Attending: Psychiatry | Admitting: Psychiatry

## 2023-02-06 ENCOUNTER — Encounter (HOSPITAL_COMMUNITY): Payer: Self-pay | Admitting: Psychiatry

## 2023-02-06 DIAGNOSIS — I10 Essential (primary) hypertension: Secondary | ICD-10-CM

## 2023-02-06 DIAGNOSIS — Z818 Family history of other mental and behavioral disorders: Secondary | ICD-10-CM | POA: Diagnosis not present

## 2023-02-06 DIAGNOSIS — R4182 Altered mental status, unspecified: Secondary | ICD-10-CM | POA: Diagnosis not present

## 2023-02-06 DIAGNOSIS — Z7689 Persons encountering health services in other specified circumstances: Secondary | ICD-10-CM
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
## 2023-02-06 NOTE — Discharge Instructions (Addendum)
Patient is instructed prior to discharge to: Take all medications as prescribed by his/her mental healthcare provider. Report any adverse effects and or reactions from the medicines to his/her outpatient provider promptly. Keep all scheduled appointments, to ensure that you are getting refills on time and to avoid any interruption in your medication.  If you are unable to keep an appointment call to reschedule.  Be sure to follow-up with resources and follow-up appointments provided.  Patient has been instructed & cautioned: To not engage in alcohol and or illegal drug use while on prescription medicines. In the event of worsening symptoms, patient is instructed to call the crisis hotline, 911 and or go to the nearest ED for appropriate evaluation and treatment of symptoms. To follow-up with his/her primary care provider for your other medical issues, concerns and or health care needs.  Information: -National Suicide Prevention Lifeline 1-800-SUICIDE or 704-869-1150.  -988 offers 24/7 access to trained crisis counselors who can help people experiencing mental health-related distress. People can call or text 988 or chat 988lifeline.org for themselves or if they are worried about a loved one who may need crisis support.    ..Marland KitchenMarland KitchenBased on the information that you have provided and the presenting issues outpatient services and resources for have been recommended.  It is imperative that you follow through with treatment recommendations within 5-7 days from the of discharge to mitigate further risk to your safety and mental well-being. A list of referrals has been provided below to get you started.  You are not limited to the list provided.  In case of an urgent crisis, you may contact the Mobile Crisis Unit with [facility] at 1.769-256-6444.  Find Lobbyist & Counselors in [place]. Show cities in [place]      Deciding to see a therapist may have been difficult,  but finding one who accepts your insurance may be even more challenging. And while you're trying to cope with your own mental and emotional issues, dealing with insurance issues may be the last thing you want to do.  At [facility], we specialize in matching people to therapists near you who accept or are in-network with [employer].  Wherever you are located in [place] from [place] to [place] or even [place], or anywhere else nearby, [facility] can help you easily find an experienced therapist or counselor who best meets your needs.  Does Your Insurance Cover Therapy? Before you start therapy, the first thing you should do is verify your outpatient [employer] therapy coverage. If your plan doesn't cover behavioral health therapy, they won't pay; it may surprise you how many plans don't include therapist insurance coverage.  To determine what your plan covers, you can call the customer service number on your insurance card. Once you know for sure that your insurance will cover outpatient behavioral health, leave it to [place] to help you find a therapist who accepts [employer] plans.  If you would like to learn more about how to use insurance to cover therapy, check out our FAQ about how to pay for therapy.  Find a Therapist Near You Who Accepts [employer] Therapy Coverage Instead of Googling "therapists that take my insurance" and sifting through tons of results to no avail, allow [place] to help. After all, the longer you spend trying to figure out how to pay for therapy, the longer your treatment may be delayed.  With [facility], finding a qualified therapist near you who takes [employer] therapy coverage  is easy! Start by browsing in-network experts below or by using our free therapist matching tool.    Horald Pollen, [place] [phone] My clients describe me as:  I am person centered, warm, and engaging. I listen to learn what is important to each person and am positive  and optimistic.  Something to know about my approach is:  In the past 28 years, I have worked with people experiencing a wide range of concerns. I use mindfulness therapy as well as person centered skills and find Yogic breathing as well as guided meditation to be beneficial tools.  Together, we will:  We will partner in seeking purpose, meaning, and a happier life by addressing those things that may be holding you back, troubling, or confusing you.  I specialize in working with people who:  I specialize in working with grief, loss, end-of-life planning, anxiety, stress, coping, trauma, and improving self-care. I am a nationally certified person centered Engineer, materials.    Ed Blalock, [facility]  [phone] My clients describe me as:  Someone who offers a judgement free environment. Quickly identifies behaviors and intrusive thoughts that lead to unwanted outcomes in life/situations  Something to know about my approach is:  I take an informal approach to counseling. I like to describe it as having a casual conversation in a safe environment. It's important that I earn your trust and you feel safe with me. I am straightforward but with care. I will challenge you.  Together, we will:  Work together to get you where you desire and need to be. I offer effective approaches and desired outcomes.  I specialize in working with people who:  With personality disorders, trauma, military culture and sexual addiction. It's important to understand how one's foundation was built. We may need to rebuild this as we work together to identify unhealthy behaviors and coping skills.  I became a therapist because:  I am fascinated with how the brain works and I feel purposeful helping others heal.  Something that stands out about where I offer therapy is:  I provide an environment that is informal, come as you are. I provide an environment that allows you to be vulnerable and open.  More about  me and my practice:  I am someone who is easy going. I enjoy helping others in an informal manner and use the time we have to maximize results.     [facility] [address], [address] [place], [place], [address] [phone] phone  [facility] - [facility] [address], [place], [address] [phone] phone  [facility] [address] [place], [place], [address] [phone] phone [phone] fax  [facility] [phone] [address] [place], [place], [address] [phone] phone  Jovita Kussmaul [address] [place], [place], [address]  [phone] phone  [facility]  (Adults Only) [address] [place], [place], [address]  [phone] phone [phone] fax

## 2023-02-21 ENCOUNTER — Emergency Department (HOSPITAL_COMMUNITY)
Admission: EM | Admit: 2023-02-21 | Discharge: 2023-02-21 | Disposition: A | Discharge status: Home / Self Care | Payer: No Typology Code available for payment source | Attending: Emergency Medicine | Admitting: Emergency Medicine

## 2023-02-21 ENCOUNTER — Other Ambulatory Visit: Payer: Self-pay

## 2023-02-21 DIAGNOSIS — I10 Essential (primary) hypertension: Secondary | ICD-10-CM | POA: Diagnosis not present

## 2023-02-21 DIAGNOSIS — Z79899 Other long term (current) drug therapy: Secondary | ICD-10-CM | POA: Diagnosis not present

## 2023-02-21 LAB — BASIC METABOLIC PANEL
Anion gap: 11 (ref 5–15)
BUN: 12 mg/dL (ref 8–23)
CO2: 24 mmol/L (ref 22–32)
Calcium: 9.1 mg/dL (ref 8.9–10.3)
Chloride: 103 mmol/L (ref 98–111)
Creatinine, Ser: 1.21 mg/dL (ref 0.61–1.24)
GFR, Estimated: >60 mL/min (ref >60)
Glucose, Bld: 118 mg/dL — ABNORMAL HIGH (ref 70–99)
Potassium: 3.4 mmol/L — ABNORMAL LOW (ref 3.5–5.1)
Sodium: 138 mmol/L (ref 135–145)

## 2023-02-21 LAB — URINALYSIS, ROUTINE W REFLEX MICROSCOPIC
Bilirubin Urine: NEGATIVE
Glucose, UA: NEGATIVE mg/dL
Hgb urine dipstick: NEGATIVE
Ketones, ur: NEGATIVE mg/dL
Leukocytes,Ua: NEGATIVE
Nitrite: NEGATIVE
Protein, ur: NEGATIVE mg/dL
Specific Gravity, Urine: 1.009 (ref 1.005–1.030)
pH: 5 (ref 5.0–8.0)

## 2023-02-21 LAB — CBC WITH DIFFERENTIAL/PLATELET
Abs Immature Granulocytes: 0.02 10*3/uL (ref 0.00–0.07)
Basophils Absolute: 0.1 10*3/uL (ref 0.0–0.1)
Basophils Relative: 1 %
Eosinophils Absolute: 0.1 10*3/uL (ref 0.0–0.5)
Eosinophils Relative: 2 %
HCT: 46.2 % (ref 39.0–52.0)
Hemoglobin: 15.5 g/dL (ref 13.0–17.0)
Immature Granulocytes: 0 %
Lymphocytes Relative: 46 %
Lymphs Abs: 3 10*3/uL (ref 0.7–4.0)
MCH: 30.7 pg (ref 26.0–34.0)
MCHC: 33.5 g/dL (ref 30.0–36.0)
MCV: 91.5 fL (ref 80.0–100.0)
Monocytes Absolute: 0.4 10*3/uL (ref 0.1–1.0)
Monocytes Relative: 6 %
Neutro Abs: 2.9 10*3/uL (ref 1.7–7.7)
Neutrophils Relative %: 45 %
Platelets: 263 10*3/uL (ref 150–400)
RBC: 5.05 MIL/uL (ref 4.22–5.81)
RDW: 12.9 % (ref 11.5–15.5)
WBC: 6.5 10*3/uL (ref 4.0–10.5)
nRBC: 0 % (ref 0.0–0.2)

## 2023-02-21 MED ORDER — AMLODIPINE BESYLATE 5 MG PO TABS
10.0000 mg | ORAL_TABLET | Freq: Once | ORAL | Status: AC
  Administered 2023-02-21: 10 mg via ORAL
  Filled 2023-02-21: qty 2

## 2023-02-21 MED ORDER — AMLODIPINE BESYLATE 10 MG PO TABS: 10.0000 mg | ORAL_TABLET | Freq: Every day | ORAL | 0 refills | Status: DC

## 2023-02-21 NOTE — ED Provider Notes (Signed)
[place] EMERGENCY DEPARTMENT AT [facility] Provider Note   CSN: [id number] Arrival date & time: 02/21/23  8295     History  Chief Complaint  Patient presents with   Hypertension   Anxiety    Scott Pruitt is a 62 y.o. male.  Scott Pruitt is a 62 y.o. male with history of hypertension and hyperlipidemia, who presents to the emergency department for evaluation of elevated blood pressure.  Patient reports that he has been diagnosed with high blood pressure in the past, sometimes it is related to his anxiety but he has been more persistently elevated.  He states that he was seen in the ED on 5/5 for elevated blood pressure, but did not start new medication that was prescribed as he was not sure where to pick it up.  He denies any symptoms associated with high blood pressure.  Denies any chest pain, shortness of breath, lightheadedness, syncope, headaches, vision changes, numbness or weakness.  He denies any lower extremity swelling.  He reports that he has struggled with anxiety but denies any other complaints at this time.  He has a note from his daughter's mother is concerned about his blood pressure and wants to help ensure that he gets this taken care of.  He reports that he used to have a primary care doctor but does not currently see anyone who routinely.  Patient currently resides in a hotel.  The history is provided by the patient and a friend.  Hypertension Pertinent negatives include no chest pain, no headaches and no shortness of breath.  Anxiety Pertinent negatives include no chest pain, no headaches and no shortness of breath.       Home Medications Prior to Admission medications   Medication Sig Start Date End Date Taking? Authorizing Provider  acetaminophen-codeine (TYLENOL #3) 300-30 MG tablet Take 1-2 tablets by mouth every 6 (six) hours as needed for moderate pain. 09/12/18   Sherren Mocha, MD  amLODipine (NORVASC) 10 MG tablet Take 1 tablet (10 mg total) by  mouth daily. 01/27/23 04/27/23  Gowens, Dow Adolph L, PA-C  aspirin 81 MG tablet Take 81 mg by mouth daily.    [provider]  Ibuprofen-Famotidine (DUEXIS) 800-26.6 MG TABS Take 1 tablet by mouth daily. Patient not taking: Reported on 09/16/2018 06/05/17   Cammy Copa, MD  lisinopril-hydrochlorothiazide (ZESTORETIC) 20-25 MG tablet TAKE 1 TABLET BY MOUTH EVERY DAY 09/22/20   Mliss Sax, MD  mirtazapine (REMERON) 15 MG tablet TAKE 1 TABLET BY MOUTH EVERYDAY AT BEDTIME 05/05/20   Mliss Sax, MD  pravastatin (PRAVACHOL) 40 MG tablet TAKE 1 TABLET BY MOUTH EVERYDAY AT BEDTIME 01/22/20   Collie Siad A, MD  predniSONE (DELTASONE) 20 MG tablet Take 3 PO QAM x3days, 2 PO QAM x3days, 1 PO QAM x3days Patient not taking: Reported on 03/11/2020 09/12/18   Sherren Mocha, MD  Specialty Vitamins Products (PROSTATE PO) Take by mouth.    [provider]      Allergies    Patient has no known allergies.    Review of Systems   Review of Systems  Constitutional:  Negative for chills and fever.  Eyes:  Negative for visual disturbance.  Respiratory:  Negative for cough and shortness of breath.   Cardiovascular:  Negative for chest pain and leg swelling.  Neurological:  Negative for dizziness, facial asymmetry, weakness, light-headedness, numbness and headaches.    Physical Exam Updated Vital Signs BP (!) 149/115 (BP Location: Right Arm)  Pulse 86   Temp 98.4 F (36.9 C)   Resp 18   Ht 5\' 9"  (1.753 m)   Wt 79.4 kg   SpO2 99%   BMI 25.84 kg/m  Physical Exam Vitals and nursing note reviewed.  Constitutional:      General: He is not in acute distress.    Appearance: Normal appearance. He is well-developed. He is not ill-appearing or diaphoretic.  HENT:     Head: Normocephalic and atraumatic.  Eyes:     General:        Right eye: No discharge.        Left eye: No discharge.  Cardiovascular:     Rate and Rhythm: Normal rate and regular rhythm.     Heart  sounds: Normal heart sounds.  Pulmonary:     Effort: Pulmonary effort is normal. No respiratory distress.     Breath sounds: Normal breath sounds.  Abdominal:     General: Bowel sounds are normal. There is no distension.     Tenderness: There is no abdominal tenderness.  Musculoskeletal:        General: No deformity.  Neurological:     Mental Status: He is alert and oriented to person, place, and time.     Coordination: Coordination normal.     Comments: Speech is clear, able to follow commands CN III-XII intact Normal strength in upper and lower extremities bilaterally including dorsiflexion and plantar flexion, strong and equal grip strength Sensation normal to light and sharp touch Moves extremities without ataxia, coordination intact   Psychiatric:        Mood and Affect: Mood normal.        Behavior: Behavior normal.     ED Results / Procedures / Treatments   Labs (all labs ordered are listed, but only abnormal results are displayed) Labs Reviewed  BASIC METABOLIC PANEL - Abnormal; Notable for the following components:      Result Value   Potassium 3.4 (*)    Glucose, Bld 118 (*)    All other components within normal limits  CBC WITH DIFFERENTIAL/PLATELET  URINALYSIS, ROUTINE W REFLEX MICROSCOPIC    EKG None  Radiology No results found.  Procedures Procedures    Medications Ordered in ED Medications  amLODipine (NORVASC) tablet 10 mg (10 mg Oral Given 02/21/23 1146)    ED Course/ Medical Decision Making/ A&P                             Medical Decision Making Amount and/or Complexity of Data Reviewed Labs: ordered.  Risk Prescription drug management.   Patient presents with asymptomatic hypertension.  Labs ordered from triage show no electrolyte derangements, anemia, leukocytosis or renal impairment.  Patient without other complaints.  Was recently seen in the ED and prescribed amlodipine for hypertension but did not pick up and begin taking this  medication.  I have called and spoke to the family friend that was concerned about his blood pressure and wanted to help him.  He is prescription will be sent to the [facility] on [address] so she will help ensure he gets the prescription.  He was given the first dose of the medication in the ED today.  I have also consulted transitions of care team and case management will set patient up with a primary care appointment for 6/10.  I provided this information to the patient's provide this well and she will help ensure that he gets about  appointment.  At this time there does not appear to be any evidence of an acute emergency medical condition requiring further emergent evaluation and the patient appears stable for discharge with appropriate outpatient follow up. Diagnosis and return precautions discussed with patient who verbalizes understanding and is agreeable to discharge.           Final Clinical Impression(s) / ED Diagnoses Final diagnoses:  Hypertension, unspecified type    Rx / DC Orders ED Discharge Orders          Ordered    amLODipine (NORVASC) 10 MG tablet  Daily        02/21/23 1216              Dartha Lodge, [place] 02/24/23 2012    Elayne Snare K, DO 02/26/23 [phone]

## 2023-02-21 NOTE — ED Triage Notes (Signed)
Pt. Stated, I have high BP but sometimes they call it the white coat syndrome, sometimes it goes down after Ive settled down some.  Pt has a note from his daughter about his high BP and anxiety.

## 2023-02-21 NOTE — Discharge Instructions (Addendum)
Your blood pressure medication prescription has been sent to the [facility] on [address]  You should pick this up and begin taking once daily tomorrow.   You have been scheduled for a primary care appointment with Dr. Daiva Eves on 03/05/2023, please arrive no later than 3:30 PM. Appointment address: [address] [place], [place]

## 2023-02-21 NOTE — Discharge Planning (Signed)
RNCM consulted regarding PCP establishment. Pt presented to [facility] ED today with [facility].  RNCM obtained appointment on (03/05/23), time (3:45) with Gomez-Caraballo and placed on After Visit Summary paperwork.  No further case management needs communicated at this time. Lavarius Doughten J. Lucretia Roers, RN, BSN, [place] [phone]

## 2023-03-05 ENCOUNTER — Ambulatory Visit: Payer: No Typology Code available for payment source | Admitting: Student

## 2023-03-08 ENCOUNTER — Encounter (HOSPITAL_COMMUNITY): Payer: Self-pay | Admitting: *Deleted

## 2023-03-08 ENCOUNTER — Emergency Department (HOSPITAL_COMMUNITY)
Admission: EM | Admit: 2023-03-08 | Discharge: 2023-03-08 | Disposition: A | Discharge status: Home / Self Care | Payer: No Typology Code available for payment source | Attending: Student | Admitting: Student

## 2023-03-08 ENCOUNTER — Other Ambulatory Visit: Payer: Self-pay

## 2023-03-08 DIAGNOSIS — Z79899 Other long term (current) drug therapy: Secondary | ICD-10-CM | POA: Diagnosis not present

## 2023-03-08 DIAGNOSIS — Z7982 Long term (current) use of aspirin: Secondary | ICD-10-CM | POA: Insufficient documentation

## 2023-03-08 DIAGNOSIS — S91312A Laceration without foreign body, left foot, initial encounter: Secondary | ICD-10-CM | POA: Insufficient documentation

## 2023-03-08 DIAGNOSIS — W208XXA Other cause of strike by thrown, projected or falling object, initial encounter: Secondary | ICD-10-CM | POA: Diagnosis not present

## 2023-03-08 DIAGNOSIS — Z87891 Personal history of nicotine dependence: Secondary | ICD-10-CM | POA: Insufficient documentation

## 2023-03-08 DIAGNOSIS — I10 Essential (primary) hypertension: Secondary | ICD-10-CM | POA: Diagnosis not present

## 2023-03-08 DIAGNOSIS — S99922A Unspecified injury of left foot, initial encounter: Secondary | ICD-10-CM | POA: Diagnosis present

## 2023-03-08 NOTE — ED Triage Notes (Signed)
Pt dropped a piece glass of his left foot when cleaning up broken glass. Pt A & O x 4.CBG 100 96% -92- 202/106

## 2023-03-08 NOTE — ED Provider Notes (Signed)
[place] EMERGENCY DEPARTMENT AT [facility] Provider Note  CSN: [id number] Arrival date & time: 03/08/23 1614  Chief Complaint(s) Laceration  HPI Scott Pruitt is a 62 y.o. male with PMH HTN, HLD who presents emergency room for evaluation of a left foot laceration.  Patient states that he dropped a pane of glass on his foot and suffered a laceration to the dorsum of the left foot.  No numbness, tingling, weakness of the extremity.  Tetanus last updated 2 years ago.  Denies additional traumatic complaints.   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 Authorizing Provider  acetaminophen-codeine (TYLENOL #3) 300-30 MG tablet Take 1-2 tablets by mouth every 6 (six) hours as needed for moderate pain. 09/12/18   Sherren Mocha, MD  amLODipine (NORVASC) 10 MG tablet Take 1 tablet (10 mg total) by mouth daily. 02/21/23 05/22/23  Dartha Lodge, PA-C  aspirin 81 MG tablet Take 81 mg by mouth daily.    [provider]  Ibuprofen-Famotidine (DUEXIS) 800-26.6 MG TABS Take 1 tablet by mouth daily. Patient not taking: Reported on 09/16/2018 06/05/17   Cammy Copa, MD  lisinopril-hydrochlorothiazide (ZESTORETIC) 20-25 MG tablet TAKE 1 TABLET BY MOUTH EVERY DAY 09/22/20   Mliss Sax, MD  mirtazapine (REMERON) 15 MG tablet TAKE 1 TABLET BY MOUTH EVERYDAY AT BEDTIME 05/05/20   Mliss Sax, MD  pravastatin (PRAVACHOL) 40 MG tablet TAKE 1 TABLET BY MOUTH EVERYDAY AT BEDTIME 01/22/20   Collie Siad  A, MD  predniSONE (DELTASONE) 20 MG tablet Take 3 PO QAM x3days, 2 PO QAM x3days, 1 PO QAM x3days Patient not taking: Reported on 03/11/2020 09/12/18   Sherren Mocha, MD  Specialty Vitamins Products (PROSTATE PO) Take by mouth.    [provider]                                                                                                                                    Past Surgical History Past Surgical History:  Procedure Laterality Date   FRACTURE SURGERY Left    left leg   Family History Family History  Problem Relation Age of Onset   Stroke Father    Hypertension Sister     Social History Social History   Tobacco Use   Smoking status: Former   Smokeless tobacco: Never  Building services engineer Use: Never used  Substance Use Topics   Alcohol use: Yes    Alcohol/week: 1.0 standard  drink of alcohol    Types: 1 Cans of beer per week    Comment: occ   Drug use: No   Allergies Patient has no known allergies.  Review of Systems Review of Systems  Skin:  Positive for wound.    Physical Exam Vital Signs  I have reviewed the triage vital signs BP (!) 170/121 (BP Location: Left Arm)   Pulse 92   Temp 98.2 F (36.8 C) (Oral)   Resp 16   SpO2 98%   Physical Exam Constitutional:      General: He is not in acute distress.    Appearance: Normal appearance.  HENT:     Head: Normocephalic and atraumatic.     Nose: No congestion or rhinorrhea.  Eyes:     General:        Right eye: No discharge.        Left eye: No discharge.     Extraocular Movements: Extraocular movements intact.     Pupils: Pupils are equal, round, and reactive to light.  Cardiovascular:     Rate and Rhythm: Normal rate and regular rhythm.     Heart sounds: No murmur heard. Pulmonary:     Effort: No respiratory distress.     Breath sounds: No wheezing or rales.  Abdominal:     General: There is no distension.     Tenderness: There is no abdominal tenderness.  Musculoskeletal:         General: Normal range of motion.     Cervical back: Normal range of motion.  Skin:    General: Skin is warm and dry.     Findings: Lesion present.  Neurological:     General: No focal deficit present.     Mental Status: He is alert.     ED Results and Treatments Labs (all labs ordered are listed, but only abnormal results are displayed) Labs Reviewed - No data to display                                                                                                                        Radiology No results found.  Pertinent labs & imaging results that were available during my care of the patient were reviewed by me and considered in my medical decision making (see MDM for details).  Medications Ordered in ED Medications - No data to display  Procedures .Marland KitchenLaceration Repair  Date/Time: 03/08/2023 4:53 PM  Performed by: Glendora Score, MD Authorized by: Glendora Score, MD   Anesthesia:    Anesthesia method:  None Laceration details:    Location:  Foot   Foot location:  Top of L foot   Length (cm):  1 Pre-procedure details:    Preparation:  Patient was prepped and draped in usual sterile fashion Exploration:    Contaminated: no   Treatment:    Area cleansed with:  Soap and water   Amount of cleaning:  Standard   Irrigation solution:  Tap water   Irrigation volume:  1000   Irrigation method:  Pressure wash Skin repair:    Repair method:  Sutures   Suture size:  3-0   Suture material:  Nylon   Suture technique:  Simple interrupted   Number of sutures:  1 Approximation:    Approximation:  Close Repair type:    Repair type:  Simple Post-procedure details:    Dressing:  Non-adherent dressing   Procedure completion:  Tolerated well, no immediate complications   (including critical care time)  Medical Decision Making / ED  Course   This patient presents to the ED for concern of foot laceration, this involves an extensive number of treatment options, and is a complaint that carries with it a high risk of complications and morbidity.  The differential diagnosis includes laceration, fracture, retained foreign body  MDM: Patient seen emergency room for evaluation of a foot laceration.  Physical exam with a 1 cm laceration over the dorsum of the left foot with bleeding controlled.  I did offer topical anesthetic which the patient declined.  1 suture placed and laceration repaired.  Tetanus not given as the patient is already up-to-date.  Patient then discharged with outpatient follow-up and instructions to return in 1 week for suture removal.   Additional history obtained:  -External records from outside source obtained and reviewed including: Chart review including previous notes, labs, imaging, consultation notes    Medicines ordered and prescription drug management: No orders of the defined types were placed in this encounter.   -I have reviewed the patients home medicines and have made adjustments as needed  Critical interventions none   Social Determinants of Health:  Factors impacting patients care include: none   Reevaluation: After the interventions noted above, I reevaluated the patient and found that they have :improved  Co morbidities that complicate the patient evaluation  Past Medical History:  Diagnosis Date   Hyperlipidemia    Hypertension       Dispostion: I considered admission for this patient, but at this time with laceration repaired he does not meet inpatient criteria for admission and he is safe for discharge with outpatient follow-up     Final Clinical Impression(s) / ED Diagnoses Final diagnoses:  Laceration of left foot, initial encounter     @PCDICTATION @    Glendora Score, MD 03/08/23 1656

## 2023-04-17 ENCOUNTER — Emergency Department (HOSPITAL_COMMUNITY): Payer: No Typology Code available for payment source

## 2023-04-17 ENCOUNTER — Other Ambulatory Visit: Payer: Self-pay

## 2023-04-17 ENCOUNTER — Encounter (HOSPITAL_COMMUNITY): Payer: Self-pay | Admitting: Internal Medicine

## 2023-04-17 ENCOUNTER — Inpatient Hospital Stay (HOSPITAL_COMMUNITY)
Admission: EM | Admit: 2023-04-17 | Discharge: 2023-05-11 | DRG: 480 | Disposition: A | Discharge status: Skilled Nursing Facility | Payer: No Typology Code available for payment source | Attending: Internal Medicine | Admitting: Internal Medicine

## 2023-04-17 DIAGNOSIS — Z5941 Food insecurity: Secondary | ICD-10-CM

## 2023-04-17 DIAGNOSIS — Y838 Other surgical procedures as the cause of abnormal reaction of the patient, or of later complication, without mention of misadventure at the time of the procedure: Secondary | ICD-10-CM | POA: Diagnosis not present

## 2023-04-17 DIAGNOSIS — W19XXXA Unspecified fall, initial encounter: Secondary | ICD-10-CM | POA: Insufficient documentation

## 2023-04-17 DIAGNOSIS — I97821 Postprocedural cerebrovascular infarction during other surgery: Secondary | ICD-10-CM | POA: Diagnosis not present

## 2023-04-17 DIAGNOSIS — R7989 Other specified abnormal findings of blood chemistry: Secondary | ICD-10-CM | POA: Diagnosis present

## 2023-04-17 DIAGNOSIS — E785 Hyperlipidemia, unspecified: Secondary | ICD-10-CM | POA: Diagnosis not present

## 2023-04-17 DIAGNOSIS — Z8249 Family history of ischemic heart disease and other diseases of the circulatory system: Secondary | ICD-10-CM

## 2023-04-17 DIAGNOSIS — Z823 Family history of stroke: Secondary | ICD-10-CM | POA: Diagnosis not present

## 2023-04-17 DIAGNOSIS — N19 Unspecified kidney failure: Secondary | ICD-10-CM

## 2023-04-17 DIAGNOSIS — I82451 Acute embolism and thrombosis of right peroneal vein: Secondary | ICD-10-CM | POA: Diagnosis not present

## 2023-04-17 DIAGNOSIS — I10 Essential (primary) hypertension: Secondary | ICD-10-CM | POA: Diagnosis not present

## 2023-04-17 DIAGNOSIS — S72001P Fracture of unspecified part of neck of right femur, subsequent encounter for closed fracture with malunion: Secondary | ICD-10-CM | POA: Diagnosis not present

## 2023-04-17 DIAGNOSIS — W010XXA Fall on same level from slipping, tripping and stumbling without subsequent striking against object, initial encounter: Secondary | ICD-10-CM | POA: Diagnosis present

## 2023-04-17 DIAGNOSIS — I739 Peripheral vascular disease, unspecified: Secondary | ICD-10-CM | POA: Diagnosis present

## 2023-04-17 DIAGNOSIS — I6381 Other cerebral infarction due to occlusion or stenosis of small artery: Secondary | ICD-10-CM | POA: Diagnosis not present

## 2023-04-17 DIAGNOSIS — R54 Age-related physical debility: Secondary | ICD-10-CM | POA: Diagnosis present

## 2023-04-17 DIAGNOSIS — I82411 Acute embolism and thrombosis of right femoral vein: Secondary | ICD-10-CM | POA: Diagnosis not present

## 2023-04-17 DIAGNOSIS — Z7982 Long term (current) use of aspirin: Secondary | ICD-10-CM

## 2023-04-17 DIAGNOSIS — I6340 Cerebral infarction due to embolism of unspecified cerebral artery: Secondary | ICD-10-CM | POA: Diagnosis not present

## 2023-04-17 DIAGNOSIS — R4189 Other symptoms and signs involving cognitive functions and awareness: Secondary | ICD-10-CM | POA: Diagnosis not present

## 2023-04-17 DIAGNOSIS — E7800 Pure hypercholesterolemia, unspecified: Secondary | ICD-10-CM

## 2023-04-17 DIAGNOSIS — L89152 Pressure ulcer of sacral region, stage 2: Secondary | ICD-10-CM | POA: Diagnosis not present

## 2023-04-17 DIAGNOSIS — E860 Dehydration: Secondary | ICD-10-CM | POA: Diagnosis present

## 2023-04-17 DIAGNOSIS — Z79899 Other long term (current) drug therapy: Secondary | ICD-10-CM

## 2023-04-17 DIAGNOSIS — I6389 Other cerebral infarction: Secondary | ICD-10-CM | POA: Diagnosis not present

## 2023-04-17 DIAGNOSIS — E876 Hypokalemia: Secondary | ICD-10-CM | POA: Diagnosis not present

## 2023-04-17 DIAGNOSIS — I82431 Acute embolism and thrombosis of right popliteal vein: Secondary | ICD-10-CM | POA: Diagnosis not present

## 2023-04-17 DIAGNOSIS — H4922 Sixth [abducent] nerve palsy, left eye: Secondary | ICD-10-CM | POA: Diagnosis not present

## 2023-04-17 DIAGNOSIS — S72001G Fracture of unspecified part of neck of right femur, subsequent encounter for closed fracture with delayed healing: Secondary | ICD-10-CM | POA: Diagnosis not present

## 2023-04-17 DIAGNOSIS — S72001A Fracture of unspecified part of neck of right femur, initial encounter for closed fracture: Secondary | ICD-10-CM | POA: Diagnosis present

## 2023-04-17 DIAGNOSIS — I82441 Acute embolism and thrombosis of right tibial vein: Secondary | ICD-10-CM | POA: Diagnosis not present

## 2023-04-17 DIAGNOSIS — D72829 Elevated white blood cell count, unspecified: Secondary | ICD-10-CM | POA: Diagnosis not present

## 2023-04-17 DIAGNOSIS — D649 Anemia, unspecified: Secondary | ICD-10-CM | POA: Diagnosis not present

## 2023-04-17 DIAGNOSIS — Z5901 Sheltered homelessness: Secondary | ICD-10-CM | POA: Diagnosis not present

## 2023-04-17 DIAGNOSIS — E559 Vitamin D deficiency, unspecified: Secondary | ICD-10-CM | POA: Insufficient documentation

## 2023-04-17 DIAGNOSIS — F1290 Cannabis use, unspecified, uncomplicated: Secondary | ICD-10-CM | POA: Diagnosis present

## 2023-04-17 DIAGNOSIS — I639 Cerebral infarction, unspecified: Secondary | ICD-10-CM

## 2023-04-17 DIAGNOSIS — H53009 Unspecified amblyopia, unspecified eye: Secondary | ICD-10-CM | POA: Diagnosis present

## 2023-04-17 DIAGNOSIS — L8990 Pressure ulcer of unspecified site, unspecified stage: Secondary | ICD-10-CM | POA: Insufficient documentation

## 2023-04-17 DIAGNOSIS — Z8673 Personal history of transient ischemic attack (TIA), and cerebral infarction without residual deficits: Secondary | ICD-10-CM

## 2023-04-17 DIAGNOSIS — R748 Abnormal levels of other serum enzymes: Secondary | ICD-10-CM | POA: Diagnosis present

## 2023-04-17 DIAGNOSIS — R29701 NIHSS score 1: Secondary | ICD-10-CM | POA: Diagnosis not present

## 2023-04-17 DIAGNOSIS — Y9259 Other trade areas as the place of occurrence of the external cause: Secondary | ICD-10-CM | POA: Diagnosis not present

## 2023-04-17 DIAGNOSIS — Z751 Person awaiting admission to adequate facility elsewhere: Secondary | ICD-10-CM

## 2023-04-17 DIAGNOSIS — S72141A Displaced intertrochanteric fracture of right femur, initial encounter for closed fracture: Principal | ICD-10-CM | POA: Diagnosis present

## 2023-04-17 DIAGNOSIS — E538 Deficiency of other specified B group vitamins: Secondary | ICD-10-CM | POA: Diagnosis present

## 2023-04-17 DIAGNOSIS — Z87891 Personal history of nicotine dependence: Secondary | ICD-10-CM

## 2023-04-17 DIAGNOSIS — E871 Hypo-osmolality and hyponatremia: Secondary | ICD-10-CM | POA: Insufficient documentation

## 2023-04-17 DIAGNOSIS — J029 Acute pharyngitis, unspecified: Secondary | ICD-10-CM | POA: Diagnosis present

## 2023-04-17 DIAGNOSIS — R7401 Elevation of levels of liver transaminase levels: Secondary | ICD-10-CM | POA: Diagnosis not present

## 2023-04-17 DIAGNOSIS — S72001D Fracture of unspecified part of neck of right femur, subsequent encounter for closed fracture with routine healing: Secondary | ICD-10-CM | POA: Diagnosis not present

## 2023-04-17 DIAGNOSIS — R251 Tremor, unspecified: Secondary | ICD-10-CM | POA: Diagnosis not present

## 2023-04-17 DIAGNOSIS — K5900 Constipation, unspecified: Secondary | ICD-10-CM | POA: Diagnosis not present

## 2023-04-17 DIAGNOSIS — R911 Solitary pulmonary nodule: Secondary | ICD-10-CM | POA: Diagnosis present

## 2023-04-17 LAB — CBC WITH DIFFERENTIAL/PLATELET
Abs Immature Granulocytes: 0.05 10*3/uL (ref 0.00–0.07)
Basophils Absolute: 0 10*3/uL (ref 0.0–0.1)
Basophils Relative: 0 %
Eosinophils Absolute: 0 10*3/uL (ref 0.0–0.5)
Eosinophils Relative: 0 %
HCT: 42.5 % (ref 39.0–52.0)
Hemoglobin: 14.1 g/dL (ref 13.0–17.0)
Immature Granulocytes: 1 %
Lymphocytes Relative: 10 %
Lymphs Abs: 1.1 10*3/uL (ref 0.7–4.0)
MCH: 29.4 pg (ref 26.0–34.0)
MCHC: 33.2 g/dL (ref 30.0–36.0)
MCV: 88.7 fL (ref 80.0–100.0)
Monocytes Absolute: 0.9 10*3/uL (ref 0.1–1.0)
Monocytes Relative: 8 %
Neutro Abs: 8.8 10*3/uL — ABNORMAL HIGH (ref 1.7–7.7)
Neutrophils Relative %: 81 %
Platelets: 195 10*3/uL (ref 150–400)
RBC: 4.79 MIL/uL (ref 4.22–5.81)
RDW: 13 % (ref 11.5–15.5)
WBC: 10.9 10*3/uL — ABNORMAL HIGH (ref 4.0–10.5)
nRBC: 0 % (ref 0.0–0.2)

## 2023-04-17 LAB — COMPREHENSIVE METABOLIC PANEL
ALT: 40 U/L (ref 0–44)
AST: 42 U/L — ABNORMAL HIGH (ref 15–41)
Albumin: 3.3 g/dL — ABNORMAL LOW (ref 3.5–5.0)
Alkaline Phosphatase: 67 U/L (ref 38–126)
Anion gap: 15 (ref 5–15)
BUN: 42 mg/dL — ABNORMAL HIGH (ref 8–23)
CO2: 24 mmol/L (ref 22–32)
Calcium: 8.9 mg/dL (ref 8.9–10.3)
Chloride: 95 mmol/L — ABNORMAL LOW (ref 98–111)
Creatinine, Ser: 1.08 mg/dL (ref 0.61–1.24)
GFR, Estimated: >60 mL/min (ref >60)
Glucose, Bld: 104 mg/dL — ABNORMAL HIGH (ref 70–99)
Potassium: 4.1 mmol/L (ref 3.5–5.1)
Sodium: 134 mmol/L — ABNORMAL LOW (ref 135–145)
Total Bilirubin: 1.3 mg/dL — ABNORMAL HIGH (ref 0.3–1.2)
Total Protein: 7 g/dL (ref 6.5–8.1)

## 2023-04-17 LAB — CK: Total CK: 772 U/L — ABNORMAL HIGH (ref 49–397)

## 2023-04-17 MED ORDER — ACETAMINOPHEN 325 MG PO TABS
650.0000 mg | ORAL_TABLET | Freq: Four times a day (QID) | ORAL | Status: DC | PRN
  Administered 2023-04-18 – 2023-05-08 (×8): 650 mg via ORAL
  Filled 2023-04-17 (×8): qty 2

## 2023-04-17 MED ORDER — LABETALOL HCL 5 MG/ML IV SOLN: 10.0000 mg | INTRAVENOUS | Status: DC | PRN

## 2023-04-17 MED ORDER — LACTATED RINGERS IV SOLN: INTRAVENOUS | Status: AC

## 2023-04-17 MED ORDER — ONDANSETRON HCL 4 MG/2ML IJ SOLN: 4.0000 mg | Freq: Four times a day (QID) | INTRAMUSCULAR | Status: DC | PRN

## 2023-04-17 MED ORDER — ACETAMINOPHEN 650 MG RE SUPP: 650.0000 mg | Freq: Four times a day (QID) | RECTAL | Status: DC | PRN

## 2023-04-17 MED ORDER — FENTANYL CITRATE PF 50 MCG/ML IJ SOSY
50.0000 ug | PREFILLED_SYRINGE | INTRAMUSCULAR | Status: DC | PRN
  Administered 2023-04-19 – 2023-05-04 (×17): 50 ug via INTRAVENOUS
  Filled 2023-04-17 (×17): qty 1

## 2023-04-17 MED ORDER — NALOXONE HCL 0.4 MG/ML IJ SOLN: 0.4000 mg | INTRAMUSCULAR | Status: DC | PRN

## 2023-04-17 MED ORDER — MELATONIN 3 MG PO TABS
3.0000 mg | ORAL_TABLET | Freq: Every evening | ORAL | Status: DC | PRN
  Administered 2023-04-20 – 2023-05-09 (×18): 3 mg via ORAL
  Filled 2023-04-17 (×18): qty 1

## 2023-04-17 MED ORDER — SODIUM CHLORIDE 0.9 % IV BOLUS
500.0000 mL | Freq: Once | INTRAVENOUS | Status: AC
  Administered 2023-04-17: 500 mL via INTRAVENOUS

## 2023-04-17 MED ORDER — FENTANYL CITRATE PF 50 MCG/ML IJ SOSY
50.0000 ug | PREFILLED_SYRINGE | Freq: Once | INTRAMUSCULAR | Status: AC
  Administered 2023-04-17: 50 ug via INTRAVENOUS
  Filled 2023-04-17: qty 1

## 2023-04-17 MED ORDER — AMLODIPINE BESYLATE 5 MG PO TABS
5.0000 mg | ORAL_TABLET | Freq: Every day | ORAL | Status: DC
  Administered 2023-04-18 – 2023-04-27 (×11): 5 mg via ORAL
  Filled 2023-04-17 (×11): qty 1

## 2023-04-17 NOTE — H&P (Signed)
History and Physical      Scott Pruitt JXB:[id number] DOB: Feb 06, 1961 DOA: 04/17/2023; DOS: 04/17/2023  PCP: Pcp, No *** Patient coming from: home ***  I have personally briefly reviewed patient's old medical records in [facility] Link  Chief Complaint: ***  HPI: Scott Pruitt is a 62 y.o. male with medical history significant for *** who is admitted to [facility] on 04/17/2023 with *** after presenting from home*** to [facility] ED complaining of ***.    ***       ***   ED Course:  Vital signs in the ED were notable for the following: ***  Labs were notable for the following: ***  Per my interpretation, EKG in ED demonstrated the following:  ***  Imaging in the ED, per corresponding formal radiology read, was notable for the following:  ***  EDP discussed patient's case with on-call orthopedic surgery, Dr. Susa Simmonds, who has requested CT hip for further evaluation, and conveys that orthopedic surgery will formally consult and see the patient in the morning, anticipating taking the patient to the OR on 04/18/2023.  While in the ED, the following were administered: ***  Subsequently, the patient was admitted  ***  ***red    Review of Systems: As per HPI otherwise 10 point review of systems negative.   Past Medical History:  Diagnosis Date   Hyperlipidemia    Hypertension     Past Surgical History:  Procedure Laterality Date   FRACTURE SURGERY Left    left leg    Social History:  reports that he has quit smoking. He has never used smokeless tobacco. He reports current alcohol use of about 1.0 standard drink of alcohol per week. He reports that he does not use drugs.   No Known Allergies  Family History  Problem Relation Age of Onset   Stroke Father    Hypertension Sister     Family history reviewed and not pertinent ***   Prior to Admission medications   Medication Sig Start Date End Date Taking? Authorizing Provider  acetaminophen-codeine (TYLENOL  #3) 300-30 MG tablet Take 1-2 tablets by mouth every 6 (six) hours as needed for moderate pain. 09/12/18   Sherren Mocha, MD  amLODipine (NORVASC) 10 MG tablet Take 1 tablet (10 mg total) by mouth daily. 02/21/23 05/22/23  Dartha Lodge, PA-C  aspirin 81 MG tablet Take 81 mg by mouth daily.    [provider]  Ibuprofen-Famotidine (DUEXIS) 800-26.6 MG TABS Take 1 tablet by mouth daily. Patient not taking: Reported on 09/16/2018 06/05/17   Cammy Copa, MD  lisinopril-hydrochlorothiazide (ZESTORETIC) 20-25 MG tablet TAKE 1 TABLET BY MOUTH EVERY DAY 09/22/20   Mliss Sax, MD  mirtazapine (REMERON) 15 MG tablet TAKE 1 TABLET BY MOUTH EVERYDAY AT BEDTIME 05/05/20   Mliss Sax, MD  pravastatin (PRAVACHOL) 40 MG tablet TAKE 1 TABLET BY MOUTH EVERYDAY AT BEDTIME 01/22/20   Collie Siad A, MD  predniSONE (DELTASONE) 20 MG tablet Take 3 PO QAM x3days, 2 PO QAM x3days, 1 PO QAM x3days Patient not taking: Reported on 03/11/2020 09/12/18   Sherren Mocha, MD  Specialty Vitamins Products (PROSTATE PO) Take by mouth.    [provider]     Objective    Physical Exam: Vitals:   04/17/23 2017 04/17/23 2018 04/17/23 2200 04/17/23 2230  BP: (!) 177/123  (!) 177/110 (!) 170/121  Pulse: 97  92 88  Resp: (!) 21  (!) 21 19  Temp: Marland Kitchen)  97.1 F (36.2 C)     TempSrc: Oral     SpO2: 100%  100% 100%  Weight:  74.8 kg    Height:  5\' 9"  (1.753 m)      General: appears to be stated age; alert, oriented Skin: warm, dry, no rash Head:  AT/[place] Mouth:  Oral mucosa membranes appear moist, normal dentition Neck: supple; trachea midline Heart:  RRR; did not appreciate any M/R/G Lungs: CTAB, did not appreciate any wheezes, rales, or rhonchi Abdomen: + BS; soft, ND, NT Vascular: 2+ pedal pulses b/l; 2+ radial pulses b/l Extremities: no peripheral edema, no muscle wasting Neuro: strength and sensation intact in upper and lower extremities b/l ***   *** Neuro: 5/5 strength  of the proximal and distal flexors and extensors of the upper and lower extremities bilaterally; sensation intact in upper and lower extremities b/l; cranial nerves II through XII grossly intact; no pronator drift; no evidence suggestive of slurred speech, dysarthria, or facial droop; Normal muscle tone. No tremors.  *** Neuro: In the setting of the patient's current mental status and associated inability to follow instructions, unable to perform full neurologic exam at this time.  As such, assessment of strength, sensation, and cranial nerves is limited at this time. Patient noted to spontaneously move all 4 extremities. No tremors.  ***    Labs on Admission: I have personally reviewed following labs and imaging studies  CBC: Recent Labs  Lab 04/17/23 2042  WBC 10.9*  NEUTROABS 8.8*  HGB 14.1  HCT 42.5  MCV 88.7  PLT 195   Basic Metabolic Panel: Recent Labs  Lab 04/17/23 2042  NA 134*  K 4.1  CL 95*  CO2 24  GLUCOSE 104*  BUN 42*  CREATININE 1.08  CALCIUM 8.9   GFR: Estimated Creatinine Clearance: 70 mL/min (by C-G formula based on SCr of 1.08 mg/dL). Liver Function Tests: Recent Labs  Lab 04/17/23 2042  AST 42*  ALT 40  ALKPHOS 67  BILITOT 1.3*  PROT 7.0  ALBUMIN 3.3*   No results for input(s): "LIPASE", "AMYLASE" in the last 168 hours. No results for input(s): "AMMONIA" in the last 168 hours. Coagulation Profile: No results for input(s): "INR", "PROTIME" in the last 168 hours. Cardiac Enzymes: Recent Labs  Lab 04/17/23 2042  CKTOTAL 772*   BNP (last 3 results) No results for input(s): "PROBNP" in the last 8760 hours. HbA1C: No results for input(s): "HGBA1C" in the last 72 hours. CBG: No results for input(s): "GLUCAP" in the last 168 hours. Lipid Profile: No results for input(s): "CHOL", "HDL", "LDLCALC", "TRIG", "CHOLHDL", "LDLDIRECT" in the last 72 hours. Thyroid Function Tests: No results for input(s): "TSH", "T4TOTAL", "FREET4", "T3FREE",  "THYROIDAB" in the last 72 hours. Anemia Panel: No results for input(s): "VITAMINB12", "FOLATE", "FERRITIN", "TIBC", "IRON", "RETICCTPCT" in the last 72 hours. Urine analysis:    Component Value Date/Time   COLORURINE YELLOW 02/21/2023 1023   APPEARANCEUR CLEAR 02/21/2023 1023   LABSPEC 1.009 02/21/2023 1023   PHURINE 5.0 02/21/2023 1023   GLUCOSEU NEGATIVE 02/21/2023 1023   HGBUR NEGATIVE 02/21/2023 1023   BILIRUBINUR NEGATIVE 02/21/2023 1023   BILIRUBINUR negative 09/12/2018 1121   BILIRUBINUR neg 12/03/2014 1521   KETONESUR NEGATIVE 02/21/2023 1023   PROTEINUR NEGATIVE 02/21/2023 1023   UROBILINOGEN 1.0 09/12/2018 1121   NITRITE NEGATIVE 02/21/2023 1023   LEUKOCYTESUR NEGATIVE 02/21/2023 1023    Radiological Exams on Admission: DG Chest Portable 1 View  Result Date: 04/17/2023 CLINICAL DATA:  fall EXAM: PORTABLE CHEST  1 VIEW COMPARISON:  CXR 09/12/18 FINDINGS: The heart size and mediastinal contours are within normal limits. Both lungs are clear. Chronic fracture of the lateral left fifth rib. IMPRESSION: 1.   No focal airspace opacity. 2.  Chronic left fifth rib fracture. Electronically Signed   By: Lorenza Cambridge M.D.   On: 04/17/2023 21:09   DG Hip Unilat W or Wo Pelvis 2-3 Views Right  Result Date: 04/17/2023 CLINICAL DATA:  Recent fall with right hip pain, initial encounter EXAM: DG HIP (WITH OR WITHOUT PELVIS) 3V RIGHT COMPARISON:  None Available. FINDINGS: Right femoral head is well seated. Intratrochanteric fracture is noted with only mild displacement. Pelvic ring as visualized is within normal limits. IMPRESSION: Adult nurse Signed   By: Alcide Clever M.D.   On: 04/17/2023 21:06      Assessment/Plan   Principal Problem:   Closed right hip fracture  (HCC)   ***            ***                ***                 ***               ***               ***               ***                ***               ***               ***               ***               ***              ***          ***  DVT prophylaxis: SCD's ***  Code Status: Full code*** Family Communication: none*** Disposition Plan: Per Rounding Team Consults called: none***;  Admission status: ***     I SPENT GREATER THAN 75 *** MINUTES IN CLINICAL CARE TIME/MEDICAL DECISION-MAKING IN COMPLETING THIS ADMISSION.      Chaney Born Alexzander Dolinger DO [facility]  From 7PM - 7AM   04/17/2023, 11:01 PM   ***

## 2023-04-17 NOTE — ED Triage Notes (Signed)
Pt bib [facility] from long term motel. Pts family called for welfare check; EMS found pt laying on the floor for approx 20hrs after mechanical fall. Pt denies hitting head for LOC. Pt c/o 10/10 L hip pain.  BP 200/110 98% RA

## 2023-04-17 NOTE — ED Notes (Signed)
[name] Penn (Significant other) stated she will be back later if any changes please call her at [phone]

## 2023-04-17 NOTE — ED Notes (Signed)
ED TO INPATIENT HANDOFF REPORT  ED Nurse Name and Phone #: [phone]  S Name/Age/Gender Scott Pruitt 62 y.o. male Room/Bed: 034C/034C  Code Status   Code Status: Full Code  Home/SNF/Other Lives in longterm motel Patient oriented to: self, place, time, and situation Is this baseline? Yes   Triage Complete: Triage complete  Chief Complaint Closed right hip fracture (HCC) [S72.001A]  Triage Note Pt bib [facility] from long term motel. Pts family called for welfare check; EMS found pt laying on the floor for approx 20hrs after mechanical fall. Pt denies hitting head for LOC. Pt c/o 10/10 L hip pain.  BP 200/110 98% RA   Allergies No Known Allergies  Level of Care/Admitting Diagnosis ED Disposition     ED Disposition  Admit   Condition  --   Comment  [facility]ospital [facility]: [facility] [[id number]]  Level of Care: Telemetry Medical [104]  May admit patient to [facility] or [facility] if equivalent level of care is available:: No  Covid Evaluation: Asymptomatic - no recent exposure (last 10 days) testing not required  Diagnosis: Closed right hip fracture [facility]) [[id number]]  Admitting Physician: Angie Fava [[id number]]  Attending Physician: Angie Fava [[id number]]  Certification:: I certify this patient will need inpatient services for at least 2 midnights  Estimated Length of Stay: 2          B Medical/Surgery History Past Medical History:  Diagnosis Date   Hyperlipidemia    Hypertension    Past Surgical History:  Procedure Laterality Date   FRACTURE SURGERY Left    left leg     A IV Location/Drains/Wounds Patient Lines/Drains/Airways Status     Active Line/Drains/Airways     Name Placement date Placement time Site Days   Peripheral IV 04/17/23 20 G Anterior;Distal;Upper;Left Arm 04/17/23  --  Arm  less than 1            Intake/Output Last 24 hours No intake or output data in the 24 hours ending 04/17/23 2252  Labs/Imaging Results  for orders placed or performed during the hospital encounter of 04/17/23 (from the past 48 hour(s))  Comprehensive metabolic panel     Status: Abnormal   Collection Time: 04/17/23  8:42 PM  Result Value Ref Range   Sodium 134 (L) 135 - 145 mmol/L   Potassium 4.1 3.5 - 5.1 mmol/L   Chloride 95 (L) 98 - 111 mmol/L   CO2 24 22 - 32 mmol/L   Glucose, Bld 104 (H) 70 - 99 mg/dL    Comment: Glucose reference range applies only to samples taken after fasting for at least 8 hours.   BUN 42 (H) 8 - 23 mg/dL   Creatinine, Ser 0.86 0.61 - 1.24 mg/dL   Calcium 8.9 8.9 - 57.8 mg/dL   Total Protein 7.0 6.5 - 8.1 g/dL   Albumin 3.3 (L) 3.5 - 5.0 g/dL   AST 42 (H) 15 - 41 U/L   ALT 40 0 - 44 U/L   Alkaline Phosphatase 67 38 - 126 U/L   Total Bilirubin 1.3 (H) 0.3 - 1.2 mg/dL   GFR, Estimated >46 >96 mL/min    Comment: (NOTE) Calculated using the CKD-EPI Creatinine Equation (2021)    Anion gap 15 5 - 15    Comment: Performed at [facility], [address], [place], [place] [address]  CK     Status: Abnormal   Collection Time: 04/17/23  8:42 PM  Result Value Ref Range  Total CK 772 (H) 49 - 397 U/L    Comment: Performed at [facility], [address], [place], [place] [address]  CBC with Differential     Status: Abnormal   Collection Time: 04/17/23  8:42 PM  Result Value Ref Range   WBC 10.9 (H) 4.0 - 10.5 K/uL   RBC 4.79 4.22 - 5.81 MIL/uL   Hemoglobin 14.1 13.0 - 17.0 g/dL   HCT 60.4 54.0 - 98.1 %   MCV 88.7 80.0 - 100.0 fL   MCH 29.4 26.0 - 34.0 pg   MCHC 33.2 30.0 - 36.0 g/dL   RDW 19.1 47.8 - 29.5 %   Platelets 195 150 - 400 K/uL   nRBC 0.0 0.0 - 0.2 %   Neutrophils Relative % 81 %   Neutro Abs 8.8 (H) 1.7 - 7.7 K/uL   Lymphocytes Relative 10 %   Lymphs Abs 1.1 0.7 - 4.0 K/uL   Monocytes Relative 8 %   Monocytes Absolute 0.9 0.1 - 1.0 K/uL   Eosinophils Relative 0 %   Eosinophils Absolute 0.0 0.0 - 0.5 K/uL   Basophils Relative 0 %   Basophils Absolute 0.0 0.0 -  0.1 K/uL   Immature Granulocytes 1 %   Abs Immature Granulocytes 0.05 0.00 - 0.07 K/uL    Comment: Performed at [facility], [address] [place], [place] [address]   DG Chest Portable 1 View  Result Date: 04/17/2023 CLINICAL DATA:  fall EXAM: PORTABLE CHEST 1 VIEW COMPARISON:  CXR 09/12/18 FINDINGS: The heart size and mediastinal contours are within normal limits. Both lungs are clear. Chronic fracture of the lateral left fifth rib. IMPRESSION: 1.   No focal airspace opacity. 2.  Chronic left fifth rib fracture. Electronically Signed   By: Lorenza Cambridge M.D.   On: 04/17/2023 21:09   DG Hip Unilat W or Wo Pelvis 2-3 Views Right  Result Date: 04/17/2023 CLINICAL DATA:  Recent fall with right hip pain, initial encounter EXAM: DG HIP (WITH OR WITHOUT PELVIS) 3V RIGHT COMPARISON:  None Available. FINDINGS: Right femoral head is well seated. Intratrochanteric fracture is noted with only mild displacement. Pelvic ring as visualized is within normal limits. IMPRESSION: Adult nurse Signed   By: Alcide Clever M.D.   On: 04/17/2023 21:06    Pending Labs Unresulted Labs (From admission, onward)     Start     Ordered   04/18/23 0500  CBC with Differential/Platelet  Tomorrow morning,   R        04/17/23 2249   04/18/23 0500  Comprehensive metabolic panel  Tomorrow morning,   R        04/17/23 2249   04/18/23 0500  Magnesium  Tomorrow morning,   R        04/17/23 2249   04/18/23 0500  Protime-INR  Tomorrow morning,   R        04/17/23 2251   04/17/23 2252  VITAMIN D 25 Hydroxy (Vit-D Deficiency, Fractures)  Add-on,   AD        04/17/23 2251   04/17/23 2250  Magnesium  Add-on,   AD        04/17/23 2249   04/17/23 2039  Type and screen [facility]  Once,   STAT       Comments:  [facility]    04/17/23 2040            Vitals/Pain Today's Vitals   04/17/23 2017  04/17/23 2018 04/17/23 2200 04/17/23 2222  BP: (!) 177/123  (!)  177/110   Pulse: 97  92   Resp: (!) 21  (!) 21   Temp: (!) 97.1 F (36.2 C)     TempSrc: Oral     SpO2: 100%  100%   Weight:  74.8 kg    Height:  5\' 9"  (1.753 m)    PainSc: 8    10-Worst pain ever    Isolation Precautions No active isolations  Medications Medications  fentaNYL (SUBLIMAZE) injection 50 mcg (has no administration in time range)  acetaminophen (TYLENOL) tablet 650 mg (has no administration in time range)    Or  acetaminophen (TYLENOL) suppository 650 mg (has no administration in time range)  melatonin tablet 3 mg (has no administration in time range)  ondansetron (ZOFRAN) injection 4 mg (has no administration in time range)  fentaNYL (SUBLIMAZE) injection 50 mcg (has no administration in time range)  naloxone [facility]) injection 0.4 mg (has no administration in time range)  sodium chloride 0.9 % bolus 500 mL (500 mLs Intravenous New Bag/Given 04/17/23 2051)    Mobility walks     Focused Assessments    R Recommendations: See Admitting Provider Note  Report given to:   Additional Notes: pt a/ox4-5,ambulatory at baseline

## 2023-04-17 NOTE — ED Provider Notes (Signed)
[facility] EMERGENCY DEPARTMENT AT [facility] Provider Note   CSN: [id number] Arrival date & time: 04/17/23  2006     History  Chief Complaint  Patient presents with   Scott Pruitt is a 62 y.o. male.   Fall  Patient presents after fall.  Reportedly slipped and fell last night.  Right hip pain.  Has been on the ground for around 20 hours.  History of hypertension.  Did not hit head or neck.     Past Medical History:  Diagnosis Date   Hyperlipidemia    Hypertension     Home Medications Prior to Admission medications   Not on File      Allergies    Patient has no known allergies.    Review of Systems   Review of Systems  Physical Exam Updated Vital Signs BP (!) 170/121   Pulse 88   Temp (!) 97.1 F (36.2 C) (Oral)   Resp 19   Ht 5\' 9"  (1.753 m)   Wt 74.8 kg   SpO2 100%   BMI 24.37 kg/m  Physical Exam Vitals and nursing note reviewed.  HENT:     Head: Atraumatic.  Eyes:     Comments: Disconjugate gaze.  Chronic for patient.  Pulmonary:     Breath sounds: No wheezing.  Abdominal:     Tenderness: There is no abdominal tenderness.  Musculoskeletal:        General: Tenderness present.     Cervical back: Neck supple. No tenderness.     Comments: Pressure wounds to left hip.  Tenderness to right hip with right leg held somewhat flexed.  Pressure wound on left posterior ribs also.  Skin:    Capillary Refill: Capillary refill takes less than 2 seconds.  Neurological:     Mental Status: He is alert and oriented to person, place, and time.     ED Results / Procedures / Treatments   Labs (all labs ordered are listed, but only abnormal results are displayed) Labs Reviewed  COMPREHENSIVE METABOLIC PANEL - Abnormal; Notable for the following components:      Result Value   Sodium 134 (*)    Chloride 95 (*)    Glucose, Bld 104 (*)    BUN 42 (*)    Albumin 3.3 (*)    AST 42 (*)    Total Bilirubin 1.3 (*)    All other components  within normal limits  CK - Abnormal; Notable for the following components:   Total CK 772 (*)    All other components within normal limits  CBC WITH DIFFERENTIAL/PLATELET - Abnormal; Notable for the following components:   WBC 10.9 (*)    Neutro Abs 8.8 (*)    All other components within normal limits  CBC WITH DIFFERENTIAL/PLATELET  COMPREHENSIVE METABOLIC PANEL  MAGNESIUM  MAGNESIUM  PROTIME-INR  VITAMIN D 25 HYDROXY (VIT D DEFICIENCY, FRACTURES)  URINALYSIS, COMPLETE (UACMP) WITH MICROSCOPIC  CK  TYPE AND SCREEN    EKG None  Radiology DG Chest Portable 1 View  Result Date: 04/17/2023 CLINICAL DATA:  fall EXAM: PORTABLE CHEST 1 VIEW COMPARISON:  CXR 09/12/18 FINDINGS: The heart size and mediastinal contours are within normal limits. Both lungs are clear. Chronic fracture of the lateral left fifth rib. IMPRESSION: 1.   No focal airspace opacity. 2.  Chronic left fifth rib fracture. Electronically Signed   By: Lorenza Cambridge M.D.   On: 04/17/2023 21:09   DG Hip Unilat W or  Wo Pelvis 2-3 Views Right  Result Date: 04/17/2023 CLINICAL DATA:  Recent fall with right hip pain, initial encounter EXAM: DG HIP (WITH OR WITHOUT PELVIS) 3V RIGHT COMPARISON:  None Available. FINDINGS: Right femoral head is well seated. Intratrochanteric fracture is noted with only mild displacement. Pelvic ring as visualized is within normal limits. IMPRESSION: Adult nurse Signed   By: Alcide Clever M.D.   On: 04/17/2023 21:06    Procedures Procedures    Medications Ordered in ED Medications  acetaminophen (TYLENOL) tablet 650 mg (has no administration in time range)    Or  acetaminophen (TYLENOL) suppository 650 mg (has no administration in time range)  melatonin tablet 3 mg (has no administration in time range)  ondansetron (ZOFRAN) injection 4 mg (has no administration in time range)  fentaNYL (SUBLIMAZE) injection 50 mcg (has no administration in time range)  naloxone (NARCAN)  injection 0.4 mg (has no administration in time range)  amLODipine (NORVASC) tablet 5 mg (has no administration in time range)  labetalol (NORMODYNE) injection 10 mg (has no administration in time range)  lactated ringers infusion (has no administration in time range)  sodium chloride 0.9 % bolus 500 mL (500 mLs Intravenous New Bag/Given 04/17/23 2051)  fentaNYL (SUBLIMAZE) injection 50 mcg (50 mcg Intravenous Given 04/17/23 2254)    ED Course/ Medical Decision Making/ A&P                             Medical Decision Making Amount and/or Complexity of Data Reviewed Labs: ordered. Radiology: ordered.  Risk Prescription drug management. Decision regarding hospitalization.   Patient with fall.  Did not hit head.  Right hip pain.  X-ray independently interpreted shows subcapital hip fracture, however read out by radiology as inotrope.  Discussed with Dr. Susa Simmonds.  He also believes likely a femoral neck fracture.  Will get CT scan to further evaluate.  Does have some pressure wounds from laying on the ground.  Fortunately his CK is only 700.  Does not have a primary care doctor.  Saw  years ago and saw someone since.  Had not been able to follow-up with another appointment that had been arranged.  Will require admission to hospital.  May be admitted        Final Clinical Impression(s) / ED Diagnoses Final diagnoses:  Fall, initial encounter  Closed fracture of right hip, initial encounter [facility])    Rx / DC Orders ED Discharge Orders     None         Benjiman Core, MD 04/17/23 2333

## 2023-04-18 ENCOUNTER — Encounter (HOSPITAL_COMMUNITY): Payer: Self-pay | Admitting: Internal Medicine

## 2023-04-18 ENCOUNTER — Other Ambulatory Visit: Payer: Self-pay

## 2023-04-18 ENCOUNTER — Encounter (HOSPITAL_COMMUNITY)
Admission: EM | Disposition: A | Discharge status: Skilled Nursing Facility | Payer: Self-pay | Source: Home / Self Care | Attending: Internal Medicine

## 2023-04-18 ENCOUNTER — Inpatient Hospital Stay (HOSPITAL_COMMUNITY): Payer: No Typology Code available for payment source

## 2023-04-18 ENCOUNTER — Inpatient Hospital Stay (HOSPITAL_COMMUNITY): Payer: No Typology Code available for payment source | Admitting: Anesthesiology

## 2023-04-18 DIAGNOSIS — E871 Hypo-osmolality and hyponatremia: Secondary | ICD-10-CM | POA: Insufficient documentation

## 2023-04-18 DIAGNOSIS — S72141A Displaced intertrochanteric fracture of right femur, initial encounter for closed fracture: Secondary | ICD-10-CM

## 2023-04-18 DIAGNOSIS — E7800 Pure hypercholesterolemia, unspecified: Secondary | ICD-10-CM | POA: Diagnosis not present

## 2023-04-18 DIAGNOSIS — R7401 Elevation of levels of liver transaminase levels: Secondary | ICD-10-CM | POA: Diagnosis present

## 2023-04-18 DIAGNOSIS — W19XXXA Unspecified fall, initial encounter: Secondary | ICD-10-CM | POA: Insufficient documentation

## 2023-04-18 DIAGNOSIS — I10 Essential (primary) hypertension: Secondary | ICD-10-CM | POA: Diagnosis not present

## 2023-04-18 DIAGNOSIS — S72001A Fracture of unspecified part of neck of right femur, initial encounter for closed fracture: Secondary | ICD-10-CM | POA: Diagnosis not present

## 2023-04-18 HISTORY — PX: INTRAMEDULLARY (IM) NAIL INTERTROCHANTERIC: SHX5875

## 2023-04-18 LAB — COMPREHENSIVE METABOLIC PANEL
ALT: 53 U/L — ABNORMAL HIGH (ref 0–44)
AST: 63 U/L — ABNORMAL HIGH (ref 15–41)
Albumin: 3.3 g/dL — ABNORMAL LOW (ref 3.5–5.0)
Alkaline Phosphatase: 71 U/L (ref 38–126)
BUN: 39 mg/dL — ABNORMAL HIGH (ref 8–23)
CO2: 26 mmol/L (ref 22–32)
Calcium: 8.9 mg/dL (ref 8.9–10.3)
Chloride: 94 mmol/L — ABNORMAL LOW (ref 98–111)
Creatinine, Ser: 0.99 mg/dL (ref 0.61–1.24)
GFR, Estimated: >60 mL/min (ref >60)
Glucose, Bld: 102 mg/dL — ABNORMAL HIGH (ref 70–99)
Potassium: 4.1 mmol/L (ref 3.5–5.1)
Sodium: 133 mmol/L — ABNORMAL LOW (ref 135–145)
Total Bilirubin: 1.5 mg/dL — ABNORMAL HIGH (ref 0.3–1.2)
Total Protein: 6.9 g/dL (ref 6.5–8.1)

## 2023-04-18 LAB — CK: Total CK: 812 U/L — ABNORMAL HIGH (ref 49–397)

## 2023-04-18 LAB — TYPE AND SCREEN: Antibody Screen: NEGATIVE

## 2023-04-18 LAB — CBC WITH DIFFERENTIAL/PLATELET
Abs Immature Granulocytes: 0.14 10*3/uL — ABNORMAL HIGH (ref 0.00–0.07)
Basophils Absolute: 0 10*3/uL (ref 0.0–0.1)
Basophils Relative: 0 %
Eosinophils Absolute: 0 10*3/uL (ref 0.0–0.5)
Eosinophils Relative: 0 %
Hemoglobin: 14.2 g/dL (ref 13.0–17.0)
Immature Granulocytes: 1 %
Lymphocytes Relative: 8 %
Lymphs Abs: 1 10*3/uL (ref 0.7–4.0)
MCH: 30.3 pg (ref 26.0–34.0)
MCHC: 34.1 g/dL (ref 30.0–36.0)
MCV: 88.9 fL (ref 80.0–100.0)
Monocytes Absolute: 1.2 10*3/uL — ABNORMAL HIGH (ref 0.1–1.0)
Monocytes Relative: 10 %
Neutrophils Relative %: 81 %
Platelets: 184 10*3/uL (ref 150–400)
RBC: 4.69 MIL/uL (ref 4.22–5.81)
RDW: 13 % (ref 11.5–15.5)
WBC: 12.4 10*3/uL — ABNORMAL HIGH (ref 4.0–10.5)
nRBC: 0 % (ref 0.0–0.2)

## 2023-04-18 LAB — ABO/RH: ABO/RH(D): B POS

## 2023-04-18 LAB — SURGICAL PCR SCREEN
MRSA, PCR: NEGATIVE
Staphylococcus aureus: NEGATIVE

## 2023-04-18 LAB — PROTIME-INR
INR: 1.2 (ref 0.8–1.2)
Prothrombin Time: 15.4 seconds — ABNORMAL HIGH (ref 11.4–15.2)

## 2023-04-18 LAB — MAGNESIUM: Magnesium: 2.2 mg/dL (ref 1.7–2.4)

## 2023-04-18 LAB — VITAMIN D 25 HYDROXY (VIT D DEFICIENCY, FRACTURES): Vit D, 25-Hydroxy: 5.9 ng/mL — ABNORMAL LOW (ref 30–100)

## 2023-04-18 SURGERY — FIXATION, FRACTURE, INTERTROCHANTERIC, WITH INTRAMEDULLARY ROD
Anesthesia: General | Site: Hip | Laterality: Right

## 2023-04-18 MED ORDER — 0.9 % SODIUM CHLORIDE (POUR BTL) OPTIME
TOPICAL | Status: DC | PRN
  Administered 2023-04-18: 1000 mL

## 2023-04-18 MED ORDER — PROPOFOL 10 MG/ML IV BOLUS
INTRAVENOUS | Status: AC
  Filled 2023-04-18: qty 20

## 2023-04-18 MED ORDER — HYDROCHLOROTHIAZIDE 25 MG PO TABS
25.0000 mg | ORAL_TABLET | Freq: Every day | ORAL | Status: DC
  Administered 2023-04-18 – 2023-04-21 (×4): 25 mg via ORAL
  Filled 2023-04-18 (×4): qty 1

## 2023-04-18 MED ORDER — LIDOCAINE 2% (20 MG/ML) 5 ML SYRINGE
INTRAMUSCULAR | Status: DC | PRN
  Administered 2023-04-18: 60 mg via INTRAVENOUS

## 2023-04-18 MED ORDER — FENTANYL CITRATE (PF) 250 MCG/5ML IJ SOLN
INTRAMUSCULAR | Status: AC
  Filled 2023-04-18: qty 5

## 2023-04-18 MED ORDER — PHENYLEPHRINE HCL-NACL 20-0.9 MG/250ML-% IV SOLN
INTRAVENOUS | Status: DC | PRN
  Administered 2023-04-18: 50 ug/min via INTRAVENOUS

## 2023-04-18 MED ORDER — PHENYLEPHRINE 80 MCG/ML (10ML) SYRINGE FOR IV PUSH (FOR BLOOD PRESSURE SUPPORT)
PREFILLED_SYRINGE | INTRAVENOUS | Status: DC | PRN
  Administered 2023-04-18 (×2): 240 ug via INTRAVENOUS
  Administered 2023-04-18 (×2): 160 ug via INTRAVENOUS

## 2023-04-18 MED ORDER — LISINOPRIL-HYDROCHLOROTHIAZIDE 20-25 MG PO TABS: 1.0000 | ORAL_TABLET | Freq: Every day | ORAL | Status: DC

## 2023-04-18 MED ORDER — CHLORHEXIDINE GLUCONATE 0.12 % MT SOLN: 15.0000 mL | Freq: Once | OROMUCOSAL | Status: AC

## 2023-04-18 MED ORDER — SUGAMMADEX SODIUM 200 MG/2ML IV SOLN
INTRAVENOUS | Status: DC | PRN
  Administered 2023-04-18: 200 mg via INTRAVENOUS

## 2023-04-18 MED ORDER — DEXAMETHASONE SODIUM PHOSPHATE 10 MG/ML IJ SOLN
INTRAMUSCULAR | Status: AC
  Filled 2023-04-18: qty 1

## 2023-04-18 MED ORDER — DOCUSATE SODIUM 100 MG PO CAPS
100.0000 mg | ORAL_CAPSULE | Freq: Two times a day (BID) | ORAL | Status: DC
  Administered 2023-04-18 – 2023-05-11 (×41): 100 mg via ORAL
  Filled 2023-04-18 (×46): qty 1

## 2023-04-18 MED ORDER — SODIUM CHLORIDE (PF) 0.9 % IJ SOLN
INTRAMUSCULAR | Status: AC
  Filled 2023-04-18: qty 10

## 2023-04-18 MED ORDER — CHLORHEXIDINE GLUCONATE 0.12 % MT SOLN
OROMUCOSAL | Status: AC
  Administered 2023-04-18: 15 mL via OROMUCOSAL
  Filled 2023-04-18: qty 15

## 2023-04-18 MED ORDER — PROPOFOL 10 MG/ML IV BOLUS
INTRAVENOUS | Status: DC | PRN
  Administered 2023-04-18: 120 mg via INTRAVENOUS

## 2023-04-18 MED ORDER — CEFAZOLIN SODIUM-DEXTROSE 2-3 GM-%(50ML) IV SOLR
INTRAVENOUS | Status: DC | PRN
  Administered 2023-04-18: 2 g via INTRAVENOUS

## 2023-04-18 MED ORDER — DEXAMETHASONE SODIUM PHOSPHATE 10 MG/ML IJ SOLN
INTRAMUSCULAR | Status: DC | PRN
  Administered 2023-04-18: 10 mg via INTRAVENOUS

## 2023-04-18 MED ORDER — CEFAZOLIN SODIUM-DEXTROSE 1-4 GM/50ML-% IV SOLN
1.0000 g | Freq: Four times a day (QID) | INTRAVENOUS | Status: AC
  Administered 2023-04-18 – 2023-04-19 (×3): 1 g via INTRAVENOUS
  Filled 2023-04-18 (×3): qty 50

## 2023-04-18 MED ORDER — ALBUMIN HUMAN 5 % IV SOLN: INTRAVENOUS | Status: DC | PRN

## 2023-04-18 MED ORDER — ROCURONIUM BROMIDE 10 MG/ML (PF) SYRINGE
PREFILLED_SYRINGE | INTRAVENOUS | Status: DC | PRN
  Administered 2023-04-18: 50 mg via INTRAVENOUS

## 2023-04-18 MED ORDER — ENOXAPARIN SODIUM 40 MG/0.4ML IJ SOSY
40.0000 mg | PREFILLED_SYRINGE | INTRAMUSCULAR | Status: DC
  Administered 2023-04-19 – 2023-04-28 (×10): 40 mg via SUBCUTANEOUS
  Filled 2023-04-18 (×10): qty 0.4

## 2023-04-18 MED ORDER — MIDAZOLAM HCL 2 MG/2ML IJ SOLN
INTRAMUSCULAR | Status: AC
  Filled 2023-04-18: qty 2

## 2023-04-18 MED ORDER — PHENYLEPHRINE 80 MCG/ML (10ML) SYRINGE FOR IV PUSH (FOR BLOOD PRESSURE SUPPORT)
PREFILLED_SYRINGE | INTRAVENOUS | Status: AC
  Filled 2023-04-18: qty 10

## 2023-04-18 MED ORDER — ONDANSETRON HCL 4 MG/2ML IJ SOLN
INTRAMUSCULAR | Status: DC | PRN
Start: 2023-04-18 — End: 2023-04-18
  Administered 2023-04-18: 4 mg via INTRAVENOUS

## 2023-04-18 MED ORDER — LIDOCAINE 2% (20 MG/ML) 5 ML SYRINGE
INTRAMUSCULAR | Status: AC
  Filled 2023-04-18: qty 5

## 2023-04-18 MED ORDER — FENTANYL CITRATE (PF) 250 MCG/5ML IJ SOLN
INTRAMUSCULAR | Status: DC | PRN
  Administered 2023-04-18: 100 ug via INTRAVENOUS
  Administered 2023-04-18: 25 ug via INTRAVENOUS

## 2023-04-18 MED ORDER — LISINOPRIL 20 MG PO TABS
20.0000 mg | ORAL_TABLET | Freq: Every day | ORAL | Status: DC
  Administered 2023-04-18 – 2023-04-27 (×10): 20 mg via ORAL
  Filled 2023-04-18 (×10): qty 1

## 2023-04-18 MED ORDER — LABETALOL HCL 5 MG/ML IV SOLN
INTRAVENOUS | Status: AC
  Filled 2023-04-18: qty 4

## 2023-04-18 MED ORDER — ORAL CARE MOUTH RINSE: 15.0000 mL | Freq: Once | OROMUCOSAL | Status: AC

## 2023-04-18 MED ORDER — ONDANSETRON HCL 4 MG/2ML IJ SOLN
INTRAMUSCULAR | Status: AC
  Filled 2023-04-18: qty 2

## 2023-04-18 MED ORDER — LACTATED RINGERS IV SOLN: INTRAVENOUS | Status: DC

## 2023-04-18 MED ORDER — MIDAZOLAM HCL 5 MG/5ML IJ SOLN
INTRAMUSCULAR | Status: DC | PRN
  Administered 2023-04-18 (×2): 1 mg via INTRAVENOUS

## 2023-04-18 MED ORDER — ROCURONIUM BROMIDE 10 MG/ML (PF) SYRINGE
PREFILLED_SYRINGE | INTRAVENOUS | Status: AC
  Filled 2023-04-18: qty 10

## 2023-04-18 SURGICAL SUPPLY — 45 items
BAG COUNTER SPONGE SURGICOUNT (BAG) ×1 IMPLANT
BAG SPNG CNTER NS LX DISP (BAG) ×1
BIT DRILL 4.0X280 (BIT) IMPLANT
BNDG CMPR 5X6 CHSV STRCH STRL (GAUZE/BANDAGES/DRESSINGS) ×1
BNDG COHESIVE 6X5 TAN ST LF (GAUZE/BANDAGES/DRESSINGS) ×1 IMPLANT
COVER MAYO STAND STRL (DRAPES) ×1 IMPLANT
COVER PERINEAL POST (MISCELLANEOUS) ×1 IMPLANT
COVER SURGICAL LIGHT HANDLE (MISCELLANEOUS) ×2 IMPLANT
DRAPE STERI IOBAN 125X83 (DRAPES) ×1 IMPLANT
DRESSING MEPILEX FLEX 4X4 (GAUZE/BANDAGES/DRESSINGS) ×1 IMPLANT
DRSG MEPILEX FLEX 4X4 (GAUZE/BANDAGES/DRESSINGS) ×2
DRSG MEPILEX POST OP 4X8 (GAUZE/BANDAGES/DRESSINGS) IMPLANT
DURAPREP 26ML APPLICATOR (WOUND CARE) ×1 IMPLANT
ELECT REM PT RETURN 9FT ADLT (ELECTROSURGICAL) ×1
ELECTRODE REM PT RTRN 9FT ADLT (ELECTROSURGICAL) ×1 IMPLANT
GAUZE PAD ABD 8X10 STRL (GAUZE/BANDAGES/DRESSINGS) IMPLANT
GLOVE BIOGEL PI IND STRL 8 (GLOVE) ×2 IMPLANT
GLOVE ECLIPSE 7.5 STRL STRAW (GLOVE) ×2 IMPLANT
GOWN STRL REUS W/ TWL LRG LVL3 (GOWN DISPOSABLE) ×1 IMPLANT
GOWN STRL REUS W/ TWL XL LVL3 (GOWN DISPOSABLE) ×2 IMPLANT
GOWN STRL REUS W/TWL LRG LVL3 (GOWN DISPOSABLE) ×1
GOWN STRL REUS W/TWL XL LVL3 (GOWN DISPOSABLE) ×2
HOLDER FOLEY CATH W/STRAP (MISCELLANEOUS) ×1 IMPLANT
KIT BASIN OR (CUSTOM PROCEDURE TRAY) ×1 IMPLANT
MANIFOLD NEPTUNE II (INSTRUMENTS) ×1 IMPLANT
NAIL 9X130X20 SHORT (Nail) IMPLANT
NS IRRIG 1000ML POUR BTL (IV SOLUTION) ×1 IMPLANT
PACK GENERAL/GYN (CUSTOM PROCEDURE TRAY) ×1 IMPLANT
PAD ARMBOARD 7.5X6 YLW CONV (MISCELLANEOUS) ×2 IMPLANT
PIN GUIDE THRD AR 3.2X330 (PIN) IMPLANT
SCREW LOCK CORT 5X36 (Screw) IMPLANT
SCREW LOCK LAG 10.5X95 GALILEO (Screw) IMPLANT
SPIKE FLUID TRANSFER (MISCELLANEOUS) ×1 IMPLANT
STAPLER VISISTAT 35W (STAPLE) ×1 IMPLANT
SUT MNCRL AB 3-0 PS2 27 (SUTURE) IMPLANT
SUT VIC AB 0 CT1 27 (SUTURE)
SUT VIC AB 0 CT1 27XBRD ANBCTR (SUTURE) IMPLANT
SUT VIC AB 1 CTB1 27 (SUTURE) ×1 IMPLANT
SUT VIC AB 2-0 CTB1 (SUTURE) ×1 IMPLANT
SUT VIC AB 2-0 SH 27 (SUTURE) ×1
SUT VIC AB 2-0 SH 27XBRD (SUTURE) IMPLANT
TOOL ACTIVATION (INSTRUMENTS) IMPLANT
TOWEL GREEN STERILE (TOWEL DISPOSABLE) ×1 IMPLANT
TRAY FOLEY MTR SLVR 16FR STAT (SET/KITS/TRAYS/PACK) ×1 IMPLANT
WATER STERILE IRR 1000ML POUR (IV SOLUTION) ×1 IMPLANT

## 2023-04-18 NOTE — Progress Notes (Signed)
  Progress Note   Patient: Scott Pruitt ONG:[id number] DOB: [birth year] DOA: 04/17/2023     1 DOS: the patient was seen and examined on 04/18/2023 at 9:28AM      Brief hospital course: Mr. Abascal is a 62 y.o. M with HTN, HLD who presented after a fall, found to have right hip fracture.     Assessment and Plan: * Closed right hip fracture (HCC) - Consult Orthopedics - To the OR today for repair    Hyponatremia Na mild.   - Continue hydrochlorothiazide for now  Transaminitis Denies EtOH.   - Check hepatitis serologies  Fall    Hyperlipidemia Not on statin at present  Essential hypertension BP controlled - Continue amlodipine - Hold lisinopril, resume tomorrow with HCTZ          Subjective: Patient has no complaints.  Hip pain, but managed.  No fever, no chest pain no respiratory symptoms.  Mild sore throat.     Physical Exam: BP 137/77   Pulse 89   Temp 98.5 F (36.9 C)   Resp 18   Ht 5\' 9"  (1.753 m)   Wt 74.8 kg   SpO2 97%   BMI 24.35 kg/m   Thin elderly male, lying in bed, interactive and appropriate RRR, no murmurs, no peripheral edema Respiratory rate normal, lungs clear without rales or wheezes Abdomen soft without tenderness palpation or guarding, no ascites or distention He has no symptoms of alcohol withdrawal Attention normal, affect appropriate, judgment and insight appear normal, somewhat withdrawn and reticent but mental status seems normal    Data Reviewed: Basic metabolic panel shows elevated BUN to creatinine ratio, normal creatinine, mild hyponatremia, mild transaminitis CBC shows reactive leukocytosis  Family Communication: None present    Disposition: Status is: Inpatient To the OR today, to SNF this weekend        Author: Alberteen Sam, MD 04/18/2023 2:00 PM  For on call review [email or web address].

## 2023-04-18 NOTE — Assessment & Plan Note (Addendum)
Denies EtOH.  Viral serologies negative. - Trend AST?ALT

## 2023-04-18 NOTE — Anesthesia Procedure Notes (Signed)
Procedure Name: Intubation Date/Time: 04/18/2023 12:53 PM  Performed by: Quentin Ore, CRNAPre-anesthesia Checklist: Patient identified, Emergency Drugs available, Suction available and Patient being monitored Patient Re-evaluated:Patient Re-evaluated prior to induction Oxygen Delivery Method: Circle system utilized Preoxygenation: Pre-oxygenation with 100% oxygen Induction Type: IV induction Ventilation: Two handed mask ventilation required and Oral airway inserted - appropriate to patient size Laryngoscope Size: Mac and 4 Grade View: Grade I Tube type: Oral Tube size: 7.5 mm Number of attempts: 1 Airway Equipment and Method: Stylet Placement Confirmation: ETT inserted through vocal cords under direct vision, positive ETCO2 and breath sounds checked- equal and bilateral Secured at: 22 cm Tube secured with: Tape Dental Injury: Teeth and Oropharynx as per pre-operative assessment

## 2023-04-18 NOTE — Assessment & Plan Note (Addendum)
S/p Cephalomedullary nail of right hip fracture by Dr. Susa Simmonds on 7/25 - Consult Orthopedics - WBAT - Aspirin at discharge

## 2023-04-18 NOTE — Plan of Care (Signed)
  Problem: Education: Goal: Knowledge of General Education information will improve Description: Including pain rating scale, medication(s)/side effects and non-pharmacologic comfort measures 04/18/2023 0410 by Loni Dolly, RN Outcome: Progressing 04/18/2023 0407 by Loni Dolly, RN Outcome: Progressing   Problem: Health Behavior/Discharge Planning: Goal: Ability to manage health-related needs will improve 04/18/2023 0410 by Loni Dolly, RN Outcome: Progressing 04/18/2023 0407 by Loni Dolly, RN Outcome: Progressing   Problem: Clinical Measurements: Goal: Ability to maintain clinical measurements within normal limits will improve 04/18/2023 0410 by Loni Dolly, RN Outcome: Progressing 04/18/2023 0407 by Loni Dolly, RN Outcome: Progressing Goal: Will remain free from infection 04/18/2023 0410 by Loni Dolly, RN Outcome: Progressing 04/18/2023 0407 by Loni Dolly, RN Outcome: Progressing Goal: Diagnostic test results will improve 04/18/2023 0410 by Loni Dolly, RN Outcome: Progressing 04/18/2023 0407 by Loni Dolly, RN Outcome: Progressing Goal: Respiratory complications will improve 04/18/2023 0410 by Loni Dolly, RN Outcome: Progressing 04/18/2023 0407 by Loni Dolly, RN Outcome: Progressing Goal: Cardiovascular complication will be avoided 04/18/2023 0410 by Loni Dolly, RN Outcome: Progressing 04/18/2023 0407 by Loni Dolly, RN Outcome: Progressing   Problem: Activity: Goal: Risk for activity intolerance will decrease 04/18/2023 0410 by Loni Dolly, RN Outcome: Progressing 04/18/2023 0407 by Loni Dolly, RN Outcome: Progressing   Problem: Nutrition: Goal: Adequate nutrition will be maintained 04/18/2023 0410 by Loni Dolly, RN Outcome: Progressing 04/18/2023 0407 by Loni Dolly, RN Outcome: Progressing   Problem: Coping: Goal: Level of anxiety will  decrease 04/18/2023 0410 by Loni Dolly, RN Outcome: Progressing 04/18/2023 0407 by Loni Dolly, RN Outcome: Progressing   Problem: Elimination: Goal: Will not experience complications related to bowel motility 04/18/2023 0410 by Loni Dolly, RN Outcome: Progressing 04/18/2023 0407 by Loni Dolly, RN Outcome: Progressing Goal: Will not experience complications related to urinary retention 04/18/2023 0410 by Loni Dolly, RN Outcome: Progressing 04/18/2023 0407 by Loni Dolly, RN Outcome: Progressing   Problem: Pain Managment: Goal: General experience of comfort will improve 04/18/2023 0410 by Loni Dolly, RN Outcome: Progressing 04/18/2023 0407 by Loni Dolly, RN Outcome: Progressing   Problem: Safety: Goal: Ability to remain free from injury will improve 04/18/2023 0410 by Loni Dolly, RN Outcome: Progressing 04/18/2023 0407 by Loni Dolly, RN Outcome: Progressing   Problem: Skin Integrity: Goal: Risk for impaired skin integrity will decrease 04/18/2023 0410 by Loni Dolly, RN Outcome: Progressing 04/18/2023 0407 by Loni Dolly, RN Outcome: Progressing

## 2023-04-18 NOTE — Assessment & Plan Note (Addendum)
BP controlled - Continue amlodipine, lisinopril, hydrochlorothiazide

## 2023-04-18 NOTE — Plan of Care (Signed)

## 2023-04-18 NOTE — Transfer of Care (Signed)
Immediate Anesthesia Transfer of Care Note  Patient: Scott Pruitt  Procedure(s) Performed: INTRAMEDULLARY (IM) NAIL INTERTROCHANTERIC (Right: Hip)  Patient Location: PACU  Anesthesia Type:General  Level of Consciousness: awake, alert , and oriented  Airway & Oxygen Therapy: Patient Spontanous Breathing  Post-op Assessment: Report given to RN, Post -op Vital signs reviewed and stable, and Patient moving all extremities X 4  Post vital signs: Reviewed and stable  Last Vitals:  Vitals Value Taken Time  BP 143/82 04/18/23 1416  Temp 37.2 C 04/18/23 1415  Pulse 83 04/18/23 1419  Resp 18 04/18/23 1419  SpO2 100 % 04/18/23 1419  Vitals shown include unfiled device data.  Last Pain:  Vitals:   04/18/23 1415  TempSrc:   PainSc: 0-No pain         Complications: No notable events documented.

## 2023-04-18 NOTE — Anesthesia Preprocedure Evaluation (Signed)
Anesthesia Evaluation  Patient identified by MRN, date of birth, ID band Patient awake    Reviewed: Allergy & Precautions, H&P , NPO status , Patient's Chart, lab work & pertinent test results  Airway Mallampati: II   Neck ROM: full    Dental   Pulmonary former smoker   breath sounds clear to auscultation       Cardiovascular hypertension,  Rhythm:regular Rate:Normal     Neuro/Psych  PSYCHIATRIC DISORDERS  Depression       GI/Hepatic   Endo/Other    Renal/GU      Musculoskeletal   Abdominal   Peds  Hematology   Anesthesia Other Findings   Reproductive/Obstetrics                             Anesthesia Physical Anesthesia Plan  ASA: 2  Anesthesia Plan: General   Post-op Pain Management:    Induction: Intravenous  PONV Risk Score and Plan: 2 and Ondansetron, Dexamethasone, Midazolam and Treatment may vary due to age or medical condition  Airway Management Planned: Oral ETT  Additional Equipment:   Intra-op Plan:   Post-operative Plan: Extubation in OR  Informed Consent: I have reviewed the patients History and Physical, chart, labs and discussed the procedure including the risks, benefits and alternatives for the proposed anesthesia with the patient or authorized representative who has indicated his/her understanding and acceptance.     Dental advisory given  Plan Discussed with: CRNA, Anesthesiologist and Surgeon  Anesthesia Plan Comments:        Anesthesia Quick Evaluation

## 2023-04-18 NOTE — Op Note (Signed)
Scott Pruitt male 62 y.o. 04/18/2023  PreOperative Diagnosis: Right intertochanteric hip fracture  PostOperative Diagnosis: same  PROCEDURE: Cephalomedullary nail of right hip fracture  SURGEON: Dub Mikes, MD  ASSISTANT: Jesse [place], PA-C was necessary for patient positioning, prep, drape, assistance with fracture reduction and placement of hardware.  ANESTHESIA: General endotracheal tube anesthesia  FINDINGS: Displaced intertrochanteric hip fracture  IMPLANTS: Arthrex intertrochanteric hip nail  INDICATIONS:62 y.o. [name] a fall and sustained an intertrochanteric hip fracture.  She was indicated for surgery due to baseline ambulatory status and significant pain due to the fracture.  Discussion was had with the patient about the surgery and they wish to proceed.  Patient understood the risks, benefits and alternatives to surgery which include but are not limited to wound healing complications, infection, nonunion, malunion, need for further surgery as well as damage to surrounding structures. They also understood the potential for continued pain in that there were no guarantees of acceptable outcome After weighing these risks the patient opted to proceed with surgery.  PROCEDURE: Patient was identified in the preoperative holding area and the right hip was marked by myself.  The consent was signed by myself and the patient.  The patient was taken to the operating room where general endotracheal anesthesia was induced without difficulty and then was moved supine on a Hana table.  The operative leg was placed into the traction boot.  The non-operative leg was well-padded and affixed to the right leg holder with coban.  The arm was crossed over the chest and well padded.  Preoperative antibiotics was given.  Surgical timeout was performed.    Using fluoroscopy appropriate AP and lateral x-rays of the hip were obtained.  Then using traction through the Hana table was able to  reduce the fracture fragments in a closed fashion.  Acceptable reduction was confirmed on AP and lateral x-rays.  Then the right hip was prepped and draped in usual sterile fashion.  A shower curtain drape was placed.  We began the surgery by placing the guidepin percutaneously at the appropriate starting point at the tip of the greater trochanter.  The appropriate starting point was confirmed on AP and lateral radiograph.  Then a 3 cm incision was made about the site of the percutaneous placement of the pin and the opening reamer with a soft tissue guide was placed into the wound down to the tip of the greater trochanter and the bone was entered with the opening reamer down to the level of lesser trochanter.  Then the openning reamer and guide pin were removed and a 1short nail was placed.  The implant placed without difficulty and appropriate positioning was confirmed on fluoroscopy.  Then a stab incision was made for guidepin placement for the cephalomedullary screw.  Guidepin was placed in the appropriate position and the length was measured.  Care was taken to place the pin and the acceptable position within the femoral head.  Then cannulated drill was used to drill for the screw placement.  Screw was placed without difficulty.  The screw was then locked within the nail.  Appropriate position was confirmed fluoroscopically.  Then the distal interlock screw was placed without difficulty.  Final films were taken.  The wounds were irrigated with normal saline and the incisions were closed in a layered fashion using 2-0 monocryl and staples.  Mepilex dressings were used.  Patient was then transferred to the hospital bed and awakened from anesthesia without difficulty.  Counts were correct at the end the  case.  There were no complications.  POST OPERATIVE INSTRUCTIONS: WBAT operative extremity Patient will be discharged on 325 mg aspirin for DVT prophylaxis if not already on DVT prophylaxis at  baseline Follow-up in 2 weeks for x-rays of the operative hip and suture removal if appropriate    BLOOD LOSS:  200 mL         DRAINS: none         SPECIMEN: none       COMPLICATIONS:  * No complications entered in OR log *         Disposition: PACU - hemodynamically stable.         Condition: stable

## 2023-04-18 NOTE — Progress Notes (Signed)
Patient is off the floor for procedure.

## 2023-04-18 NOTE — Assessment & Plan Note (Addendum)
Mild asymptomatic - Continue hydrochlorothiazide for now

## 2023-04-18 NOTE — Consult Note (Signed)
Reason for Consult: Right intertrochanteric hip fracture Referring Physician: [facility] emergency department  Scott Pruitt is an 62 y.o. male.  HPI: Patient is a 62 year old male who fell at home.  He lives alone.  He is ambulatory at baseline.  He had pain in his right hip.  He subsequently presented to the emergency department where x-rays and CT scan diagnosed a intertrochanteric hip fracture on the right hip.  No other obvious injury.  He was admitted to medicine.  Patient complains of pain in the right hip.  He denies numbness or tingling.  Past Medical History:  Diagnosis Date   Hyperlipidemia    Hypertension     Past Surgical History:  Procedure Laterality Date   FRACTURE SURGERY Left    left leg    Family History  Problem Relation Age of Onset   Stroke Father    Hypertension Sister     Social History:  reports that he has quit smoking. He has never used smokeless tobacco. He reports current alcohol use of about 1.0 standard drink of alcohol per week. He reports that he does not use drugs.  Allergies: No Known Allergies  Medications: I have reviewed the patient's current medications.  Results for orders placed or performed during the hospital encounter of 04/17/23 (from the past 48 hour(s))  Comprehensive metabolic panel     Status: Abnormal   Collection Time: 04/17/23  8:42 PM  Result Value Ref Range   Sodium 134 (L) 135 - 145 mmol/L   Potassium 4.1 3.5 - 5.1 mmol/L   Chloride 95 (L) 98 - 111 mmol/L   CO2 24 22 - 32 mmol/L   Glucose, Bld 104 (H) 70 - 99 mg/dL    Comment: Glucose reference range applies only to samples taken after fasting for at least 8 hours.   BUN 42 (H) 8 - 23 mg/dL   Creatinine, Ser 1.61 0.61 - 1.24 mg/dL   Calcium 8.9 8.9 - 09.6 mg/dL   Total Protein 7.0 6.5 - 8.1 g/dL   Albumin 3.3 (L) 3.5 - 5.0 g/dL   AST 42 (H) 15 - 41 U/L   ALT 40 0 - 44 U/L   Alkaline Phosphatase 67 38 - 126 U/L   Total Bilirubin 1.3 (H) 0.3 - 1.2 mg/dL   GFR,  Estimated >04 >54 mL/min    Comment: (NOTE) Calculated using the CKD-EPI Creatinine Equation (2021)    Anion gap 15 5 - 15    Comment: Performed at [facility], [address], [place], [place] [address]  CK     Status: Abnormal   Collection Time: 04/17/23  8:42 PM  Result Value Ref Range   Total CK 772 (H) 49 - 397 U/L    Comment: Performed at [facility], [address], [place], [place] [address]  CBC with Differential     Status: Abnormal   Collection Time: 04/17/23  8:42 PM  Result Value Ref Range   WBC 10.9 (H) 4.0 - 10.5 K/uL   RBC 4.79 4.22 - 5.81 MIL/uL   Hemoglobin 14.1 13.0 - 17.0 g/dL   HCT 29.5 62.1 - 30.8 %   MCV 88.7 80.0 - 100.0 fL   MCH 29.4 26.0 - 34.0 pg   MCHC 33.2 30.0 - 36.0 g/dL   RDW 65.7 84.6 - 96.2 %   Platelets 195 150 - 400 K/uL   nRBC 0.0 0.0 - 0.2 %   Neutrophils Relative % 81 %   Neutro  Abs 8.8 (H) 1.7 - 7.7 K/uL   Lymphocytes Relative 10 %   Lymphs Abs 1.1 0.7 - 4.0 K/uL   Monocytes Relative 8 %   Monocytes Absolute 0.9 0.1 - 1.0 K/uL   Eosinophils Relative 0 %   Eosinophils Absolute 0.0 0.0 - 0.5 K/uL   Basophils Relative 0 %   Basophils Absolute 0.0 0.0 - 0.1 K/uL   Immature Granulocytes 1 %   Abs Immature Granulocytes 0.05 0.00 - 0.07 K/uL    Comment: Performed at [facility], [address] [place], [place] [address]  Type and screen [facility]     Status: None   Collection Time: 04/18/23 12:38 AM  Result Value Ref Range   ABO/RH(D) B POS    Antibody Screen NEG    Sample Expiration      04/21/2023,2359 Performed at [facility], [address], [place], [place] [address]   CBC with Differential/Platelet     Status: Abnormal   Collection Time: 04/18/23 12:43 AM  Result Value Ref Range   WBC 12.4 (H) 4.0 - 10.5 K/uL   RBC 4.69 4.22 - 5.81 MIL/uL   Hemoglobin 14.2 13.0 - 17.0 g/dL   HCT 69.6 29.5 - 28.4 %   MCV 88.9 80.0 - 100.0 fL   MCH 30.3 26.0 - 34.0 pg   MCHC 34.1 30.0 - 36.0  g/dL   RDW 13.2 44.0 - 10.2 %   Platelets 184 150 - 400 K/uL   nRBC 0.0 0.0 - 0.2 %   Neutrophils Relative % 81 %   Neutro Abs 10.0 (H) 1.7 - 7.7 K/uL   Lymphocytes Relative 8 %   Lymphs Abs 1.0 0.7 - 4.0 K/uL   Monocytes Relative 10 %   Monocytes Absolute 1.2 (H) 0.1 - 1.0 K/uL   Eosinophils Relative 0 %   Eosinophils Absolute 0.0 0.0 - 0.5 K/uL   Basophils Relative 0 %   Basophils Absolute 0.0 0.0 - 0.1 K/uL   Immature Granulocytes 1 %   Abs Immature Granulocytes 0.14 (H) 0.00 - 0.07 K/uL    Comment: Performed at [facility], [address] [place], [place] [address]  Comprehensive metabolic panel     Status: Abnormal   Collection Time: 04/18/23 12:43 AM  Result Value Ref Range   Sodium 133 (L) 135 - 145 mmol/L   Potassium 4.1 3.5 - 5.1 mmol/L   Chloride 94 (L) 98 - 111 mmol/L   CO2 26 22 - 32 mmol/L   Glucose, Bld 102 (H) 70 - 99 mg/dL    Comment: Glucose reference range applies only to samples taken after fasting for at least 8 hours.   BUN 39 (H) 8 - 23 mg/dL   Creatinine, Ser 6.44 0.61 - 1.24 mg/dL   Calcium 8.9 8.9 - 03.4 mg/dL   Total Protein 6.9 6.5 - 8.1 g/dL   Albumin 3.3 (L) 3.5 - 5.0 g/dL   AST 63 (H) 15 - 41 U/L   ALT 53 (H) 0 - 44 U/L   Alkaline Phosphatase 71 38 - 126 U/L   Total Bilirubin 1.5 (H) 0.3 - 1.2 mg/dL   GFR, Estimated >74 >25 mL/min    Comment: (NOTE) Calculated using the CKD-EPI Creatinine Equation (2021)    Anion gap 13 5 - 15    Comment: Performed at [facility], [address] [place], [place] [address]  Magnesium     Status: None   Collection  Time: 04/18/23 12:43 AM  Result Value Ref Range   Magnesium 2.2 1.7 - 2.4 mg/dL    Comment: Performed at [facility], [address] [place], [place] [address]  Protime-INR     Status: Abnormal   Collection Time: 04/18/23 12:43 AM  Result Value Ref Range   Prothrombin Time 15.4 (H) 11.4 - 15.2 seconds   INR 1.2 0.8 - 1.2    Comment: (NOTE) INR goal varies based on device  and disease states. Performed at [facility], [address] [place], [place] [address]   CK     Status: Abnormal   Collection Time: 04/18/23 12:43 AM  Result Value Ref Range   Total CK 812 (H) 49 - 397 U/L    Comment: Performed at [facility], [address], [place], [place] [address]    CT Hip Right Wo Contrast  Result Date: 04/18/2023 CLINICAL DATA:  Trauma the right hip. EXAM: CT OF THE RIGHT HIP WITHOUT CONTRAST TECHNIQUE: Multidetector CT imaging of the right hip was performed according to the standard protocol. Multiplanar CT image reconstructions were also generated. RADIATION DOSE REDUCTION: This exam was performed according to the departmental dose-optimization program which includes automated exposure control, adjustment of the mA and/or kV according to patient size and/or use of iterative reconstruction technique. COMPARISON:  Right knee radiograph dated 04/17/2023. FINDINGS: Bones/Joint/Cartilage There is a comminuted minimally displaced inter trochanteric fracture of the right femur. No other acute fracture. The bones are osteopenic. There is no dislocation. No joint effusion. Ligaments Suboptimally assessed by CT. Muscles and Tendons No acute findings.  No intramuscular fluid collection or hematoma. Soft tissues Aortoiliac atherosclerotic disease. The soft tissues are unremarkable. IMPRESSION: 1. Comminuted intertrochanteric fracture of the right femur. 2.  Aortic Atherosclerosis (ICD10-I70.0). Electronically Signed   By: Elgie Collard M.D.   On: 04/18/2023 01:58   DG Hip Unilat W or Wo Pelvis 2-3 Views Right  Addendum Date: 04/18/2023   ADDENDUM REPORT: 04/18/2023 01:54 ADDENDUM: The impression should read: Right intratrochanteric femoral fracture is noted. Electronically Signed   By: Alcide Clever M.D.   On: 04/18/2023 01:54   Result Date: 04/18/2023 CLINICAL DATA:  Recent fall with right hip pain, initial encounter EXAM: DG HIP (WITH OR WITHOUT PELVIS) 3V RIGHT  COMPARISON:  None Available. FINDINGS: Right femoral head is well seated. Intratrochanteric fracture is noted with only mild displacement. Pelvic ring as visualized is within normal limits. IMPRESSION: Intratrochanteric Electronically Signed: By: Alcide Clever M.D. On: 04/17/2023 21:06   DG Chest Portable 1 View  Result Date: 04/17/2023 CLINICAL DATA:  fall EXAM: PORTABLE CHEST 1 VIEW COMPARISON:  CXR 09/12/18 FINDINGS: The heart size and mediastinal contours are within normal limits. Both lungs are clear. Chronic fracture of the lateral left fifth rib. IMPRESSION: 1.   No focal airspace opacity. 2.  Chronic left fifth rib fracture. Electronically Signed   By: Lorenza Cambridge M.D.   On: 04/17/2023 21:09    Review of Systems  Constitutional: Negative.   HENT: Negative.    Respiratory: Negative.    Cardiovascular: Negative.   Gastrointestinal: Negative.   Musculoskeletal:  Positive for arthralgias.  Skin: Negative.    Blood pressure 137/77, pulse 89, temperature 98.5 F (36.9 C), resp. rate 18, height 5\' 9"  (1.753 m), weight 74.8 kg, SpO2 97%. Physical Exam HENT:     Head: Normocephalic.     Mouth/Throat:     Mouth: Mucous membranes are moist.  Eyes:  Extraocular Movements: Extraocular movements intact.  Cardiovascular:     Rate and Rhythm: Normal rate.  Pulmonary:     Effort: Pulmonary effort is normal.  Abdominal:     General: Abdomen is flat.  Musculoskeletal:     Cervical back: Neck supple.     Comments: Right-sided hip pain.  Tender to palpation about the right hip.  No tenderness distally along the thigh, knee, leg or ankle.  No tenderness palpation about the contralateral leg.  Patient is moving his upper extremities without difficulty.  Skin:    General: Skin is warm.  Neurological:     General: No focal deficit present.     Mental Status: He is alert.  Psychiatric:        Mood and Affect: Mood normal.     Assessment/Plan: Patient has a right intertrochanteric hip  fracture.  Plan will be for cephalomedullary nail fixation of his hip fracture.  We discussed the risk, benefits and alternatives of surgery including wound healing complications, infection, nonunion, need for further surgery and damage to surrounding structures.  Patient would like to proceed with surgery.  Terance Hart 04/18/2023, 11:48 AM

## 2023-04-18 NOTE — TOC CAGE-AID Note (Signed)
Transition of Care [facility]) - CAGE-AID Screening   Patient Details  Name: Nazeer Romney MRN: [id number] Date of Birth: [birth year]  Transition of Care [facility]) CM/SW Contact:    Leota Sauers, RN Phone Number: 04/18/2023, 7:54 PM   Clinical Narrative:  Patient endorses minimal alcohol use (1 drink per week), denies use of illicit drugs. Education not offered at this time.  CAGE-AID Screening:    Have You Ever Felt You Ought to Cut Down on Your Drinking or Drug Use?: No Have People Annoyed You By Critizing Your Drinking Or Drug Use?: No Have You Felt Bad Or Guilty About Your Drinking Or Drug Use?: No Have You Ever Had a Drink or Used Drugs First Thing In The Morning to Steady Your Nerves or to Get Rid of a Hangover?: No CAGE-AID Score: 0  Substance Abuse Education Offered: No

## 2023-04-18 NOTE — Progress Notes (Addendum)
Patient has arrived on the unit from PACU. A//O x4. No pain complained at this time. Incision dressing looks clean and dry.

## 2023-04-18 NOTE — Assessment & Plan Note (Signed)
Not on statin at present.

## 2023-04-18 NOTE — Consult Note (Signed)
Brief orthopedic consult note, full note to follow:  62 year old male status post fall from ground-level with CT confirmed a right intertrochanteric hip fracture.  Will plan for operative fixation today.  Please keep patient NPO.  Patient is bedrest.

## 2023-04-18 NOTE — Hospital Course (Signed)
Scott Pruitt is a 62 y.o. M with HTN, HLD who presented after a fall, found to have right hip fracture.

## 2023-04-19 ENCOUNTER — Encounter (HOSPITAL_COMMUNITY): Payer: Self-pay | Admitting: Orthopaedic Surgery

## 2023-04-19 DIAGNOSIS — W19XXXA Unspecified fall, initial encounter: Secondary | ICD-10-CM | POA: Diagnosis not present

## 2023-04-19 DIAGNOSIS — E559 Vitamin D deficiency, unspecified: Secondary | ICD-10-CM | POA: Insufficient documentation

## 2023-04-19 DIAGNOSIS — S72001A Fracture of unspecified part of neck of right femur, initial encounter for closed fracture: Secondary | ICD-10-CM | POA: Diagnosis not present

## 2023-04-19 DIAGNOSIS — I10 Essential (primary) hypertension: Secondary | ICD-10-CM | POA: Diagnosis not present

## 2023-04-19 DIAGNOSIS — E7800 Pure hypercholesterolemia, unspecified: Secondary | ICD-10-CM | POA: Diagnosis not present

## 2023-04-19 LAB — BASIC METABOLIC PANEL: Creatinine, Ser: 1.08 mg/dL (ref 0.61–1.24)

## 2023-04-19 MED ORDER — OXYCODONE HCL 5 MG PO TABS
5.0000 mg | ORAL_TABLET | ORAL | Status: DC | PRN
  Administered 2023-04-19 – 2023-05-11 (×40): 5 mg via ORAL
  Filled 2023-04-19 (×42): qty 1

## 2023-04-19 MED ORDER — ASPIRIN 325 MG PO TABS: 325.0000 mg | ORAL_TABLET | Freq: Every day | ORAL | 3 refills | Status: DC

## 2023-04-19 MED ORDER — VITAMIN D 25 MCG (1000 UNIT) PO TABS
1000.0000 [IU] | ORAL_TABLET | Freq: Every day | ORAL | Status: DC
  Administered 2023-04-19 – 2023-05-11 (×23): 1000 [IU] via ORAL
  Filled 2023-04-19 (×23): qty 1

## 2023-04-19 NOTE — [place] FL2 (Signed)
[place] MEDICAID FL2 LEVEL OF CARE FORM     IDENTIFICATION  Patient Name: Scott Pruitt Birthdate: 1961-02-23 Sex: male Admission Date (Current Location): 04/17/2023  [facility] and [place] Number:  Producer, television/film/video and Address:  The . [facility], 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   Healthcare maintenance 03/11/2020   Depression, major, single episode, severe (HCC) 03/11/2020   Essential hypertension 01/22/2016   Hyperlipidemia 01/22/2016   Right-sided low back pain without sciatica 04/17/2015    Orientation RESPIRATION BLADDER Height & Weight     Self, Time, Situation, Place  Normal Incontinent Weight: 164 lb 14.5 oz (74.8 kg) Height:  5\' 9"  (175.3 cm)  BEHAVIORAL SYMPTOMS/MOOD NEUROLOGICAL BOWEL NUTRITION STATUS      Continent Diet (see d/c summary)  AMBULATORY STATUS COMMUNICATION OF NEEDS Skin   Extensive Assist   Surgical wounds (incision right hip; wound/incision right toe)                       Personal Care Assistance Level of Assistance  Feeding, Bathing, Dressing Bathing Assistance: Limited assistance Feeding assistance: Independent Dressing Assistance: Limited assistance     Functional Limitations Info  Sight, Hearing, Speech Sight Info: Adequate Hearing Info: Adequate Speech Info: Adequate    SPECIAL CARE FACTORS FREQUENCY  PT (By licensed PT), OT (By licensed  OT)     PT Frequency: 5x/week OT Frequency: 5x/week            Contractures Contractures Info: Not present    Additional Factors Info  Code Status, Allergies Code Status Info: full code Allergies Info: no known allergies           Current Medications (04/19/2023):  This is the current hospital active medication list Current Facility-Administered Medications  Medication Dose Route Frequency Provider Last Rate Last Admin   acetaminophen (TYLENOL) tablet 650 mg  650 mg Oral Q6H PRN [place], Jesse J, PA-C   650 mg at 04/18/23 2231   Or   acetaminophen (TYLENOL) suppository 650 mg  650 mg Rectal Q6H PRN [place], Jesse J, PA-C       amLODipine (NORVASC) tablet 5 mg  5 mg Oral Daily [place], Jesse J, PA-C   5 mg at 04/19/23 8242   docusate sodium (COLACE) capsule 100 mg  100 mg Oral BID [place], Jesse J, PA-C   100 mg at 04/19/23 0847   enoxaparin (LOVENOX) injection 40 mg  40 mg Subcutaneous Q24H [place], Jesse J, [place]   40 mg at 04/19/23 3536   fentaNYL (SUBLIMAZE) injection 50 mcg  50 mcg Intravenous Q2H PRN [place], Jesse J, PA-C       lisinopril (ZESTRIL) tablet 20 mg  20 mg Oral Daily [place], Jesse J, [place]   20 mg at 04/19/23 1443   And   hydrochlorothiazide (HYDRODIURIL) tablet 25 mg  25 mg Oral Daily [place], Jesse J, [place]  25 mg at 04/19/23 0847   labetalol (NORMODYNE) injection 10 mg  10 mg Intravenous Q2H PRN [place], Jesse J, PA-C       melatonin tablet 3 mg  3 mg Oral QHS PRN [place], Jesse J, PA-C       naloxone [facility]) injection 0.4 mg  0.4 mg Intravenous PRN [place], Jesse J, PA-C       ondansetron [facility]) injection 4 mg  4 mg Intravenous Q6H PRN [place], Jesse J, PA-C         Discharge Medications: Please see discharge summary for a list of discharge medications.  Relevant Imaging Results:  Relevant Lab Results:   Additional Information SSN [id number] [place], [place]

## 2023-04-19 NOTE — Progress Notes (Signed)
  Progress Note   Patient: Scott Pruitt WUJ:[id number] DOB: 29-Jul-1961 DOA: 04/17/2023     2 DOS: the patient was seen and examined on 04/19/2023       Brief hospital course: Scott Pruitt is a 62 y.o. M with HTN, HLD who presented after a fall, found to have right hip fracture.     Assessment and Plan: * Closed right hip fracture (HCC) S/p Cephalomedullary nail of right hip fracture by Dr. Susa Simmonds on 7/25 - Consult Orthopedics -Weightbearing as tolerated - Aspirin for DVT prophylaxis at discharge - Follow-up with orthopedics in 2 weeks    Hyponatremia Sodium stable at 132, asymptomatic - Continue hydrochlorothiazide for now  Transaminitis -Follow hepatitis serologies - Trend LFTs     Essential hypertension BP controlled  - Continue amlodipine, lisinopril, HCTZ          Subjective: Patient feels well, has some pain in his hip, no confusion, fever.  No nursing concerns.     Physical Exam: BP (!) 127/90 (BP Location: Right Arm)   Pulse 66   Temp 98 F (36.7 C)   Resp 18   Ht 5\' 9"  (1.753 m)   Wt 74.8 kg   SpO2 100%   BMI 24.35 kg/m   Thin adult male, lying in bed, amblyopic RRR, no murmurs, no peripheral edema Respiratory normal, lungs clear without rales or wheezes Abdomen soft no tenderness palpation or guarding, no ascites or distention Attention normal, affect normal, judgment insight appear normal, face symmetric, speech fluent, amblyopic   Data Reviewed: Basic metabolic panel shows stable hyponatremia, stable anemia       Disposition: Status is: Inpatient The patient was admitted for hip fracture.  He did well and is stable for discharge to rehab when bed is available        Author: Alberteen Sam, MD 04/19/2023 8:47 AM  For on call review [email or web address].

## 2023-04-19 NOTE — Evaluation (Signed)
Physical Therapy Evaluation Patient Details Name: Andric Hotz MRN: [id number] DOB: 1961-08-12 Today's Date: 04/19/2023  History of Present Illness  62 y.o. male admitted to [facility] on 04/17/2023 with acute right hip fracture after ground-level mechanical fall after presenting from home to [facility] ED complaining of right hip pain. He is s/p nailing of R hip fx 04/18/23. Past medical history significant for essential hypertension, hyperlipidemia  Clinical Impression  Pt presents with admitting diagnosis above. Pt was a poor historian giving conflicting information to PT and OT. At baseline pt reports he lives in a hotel and was independent with all mobility. Today pt required +2 Mod/Max for all mobility. Recommend SNF upon DC. PT will continue to follow.      Assistance Recommended at Discharge Frequent or constant Supervision/Assistance  If plan is discharge home, recommend the following:  Can travel by private vehicle  Two people to help with walking and/or transfers;A lot of help with bathing/dressing/bathroom;Assistance with cooking/housework;Direct supervision/assist for medications management;Assist for transportation;Help with stairs or ramp for entrance   No    Equipment Recommendations Other (comment) (Per accepting facility)  Recommendations for Other Services       Functional Status Assessment Patient has had a recent decline in their functional status and demonstrates the ability to make significant improvements in function in a reasonable and predictable amount of time.     Precautions / Restrictions Precautions Precautions: Fall Restrictions Weight Bearing Restrictions: Yes RLE Weight Bearing: Weight bearing as tolerated      Mobility  Bed Mobility Overal bed mobility: Needs Assistance Bed Mobility: Sit to Supine       Sit to supine: +2 for physical assistance, Total assist   General bed mobility comments: Pt returned to bed via helicopter method.     Transfers Overall transfer level: Needs assistance Equipment used: None Transfers: Sit to/from Stand, Bed to chair/wheelchair/BSC Sit to Stand: Mod assist, +2 physical assistance Stand pivot transfers: Max assist, +2 physical assistance         General transfer comment: Pt very limited by pain. Pt transferred back from chair to bed with nursing staff. Pt unable to follow commands for posture.    Ambulation/Gait               General Gait Details: pt unable  Stairs            Wheelchair Mobility     Tilt Bed    Modified Rankin (Stroke Patients Only)       Balance Overall balance assessment: Needs assistance Sitting-balance support: Bilateral upper extremity supported, Feet supported Sitting balance-Leahy Scale: Poor Sitting balance - Comments: Pt with posterior lean, hard to gather balance EOB. Able to progress to min guard with cues for anterior weight shifting and upright posture Postural control: Posterior lean   Standing balance-Leahy Scale: Zero Standing balance comment: Requires support to gather balance                             Pertinent Vitals/Pain Pain Assessment Pain Assessment: Faces Faces Pain Scale: Hurts little more Pain Location: R hip/leg Pain Descriptors / Indicators: Aching, Discomfort, Guarding Pain Intervention(s): Monitored during session, Limited activity within patient's tolerance, Repositioned    Home Living Family/patient expects to be discharged to:: Other (Comment) (hotel) Living Arrangements: Alone   Type of Home: Other(Comment) (hotel) Home Access: Level entry   Entrance Stairs-Number of Steps: 16   Home Layout: One level (lives on  first level per pt report) Home Equipment: Grab bars - tub/shower Additional Comments: Pt poor historian at baseline. Pt support system states he has a hx of vascular dementia. Pt told OT he had 16 STE while he told this therapist that he lives on the first floor of an  apartment.    Prior Function Prior Level of Function : Independent/Modified Independent             Mobility Comments: Pt reports Ind ADLs Comments: Pt reports Ind     Hand Dominance        Extremity/Trunk Assessment   Upper Extremity Assessment Upper Extremity Assessment: Generalized weakness    Lower Extremity Assessment Lower Extremity Assessment: RLE deficits/detail RLE Deficits / Details: R hip fracture       Communication   Communication: Other (comment) (slow processing.)  Cognition Arousal/Alertness: Awake/alert Behavior During Therapy: Flat affect Overall Cognitive Status: No family/caregiver present to determine baseline cognitive functioning                                 General Comments: A&Ox4 but pt with deficits listed, slow processing and decreased motor planning, impaired iniation.        General Comments General comments (skin integrity, edema, etc.): VSS on RA    Exercises     Assessment/Plan    PT Assessment Patient needs continued PT services  PT Problem List Decreased strength;Decreased range of motion;Decreased activity tolerance;Decreased balance;Decreased mobility;Decreased coordination;Decreased cognition;Decreased knowledge of use of DME;Decreased safety awareness;Pain;Decreased knowledge of precautions       PT Treatment Interventions DME instruction;Stair training;Functional mobility training;Gait training;Therapeutic activities;Therapeutic exercise;Balance training;Neuromuscular re-education;Patient/family education    PT Goals (Current goals can be found in the Care Plan section)  Acute Rehab PT Goals Patient Stated Goal: to feel better PT Goal Formulation: With patient Time For Goal Achievement: 05/03/23 Potential to Achieve Goals: Fair    Frequency Min 1X/week     Co-evaluation               AM-PAC PT "6 Clicks" Mobility  Outcome Measure Help needed turning from your back to your side while in  a flat bed without using bedrails?: A Lot Help needed moving from lying on your back to sitting on the side of a flat bed without using bedrails?: A Lot Help needed moving to and from a bed to a chair (including a wheelchair)?: A Lot Help needed standing up from a chair using your arms (e.g., wheelchair or bedside chair)?: A Lot Help needed to walk in hospital room?: Total Help needed climbing 3-5 steps with a railing? : Total 6 Click Score: 10    End of Session Equipment Utilized During Treatment: Gait belt Activity Tolerance: Patient limited by pain Patient left: in bed;with call bell/phone within reach;with nursing/sitter in room Nurse Communication: Mobility status PT Visit Diagnosis: Other abnormalities of gait and mobility (R26.89)    Time: 2956-2130 PT Time Calculation (min) (ACUTE ONLY): 14 min   Charges:   PT Evaluation $PT Eval Moderate Complexity: 1 Mod   PT General Charges $$ ACUTE PT VISIT: 1 Visit         Shela Nevin, PT, DPT Acute Rehab Services [phone]   [name] 04/19/2023, 3:02 PM

## 2023-04-19 NOTE — Evaluation (Signed)
Occupational Therapy Evaluation Patient Details Name: Scott Pruitt MRN: [id number] DOB: [birth year] Today's Date: 04/19/2023   History of Present Illness 62 y.o. male admitted to [facility] on 04/17/2023 with acute right hip fracture after ground-level mechanical fall after presenting from home to [facility] ED complaining of right hip pain. He is s/p nailing of R hip fx 04/18/23. Past medical history significant for essential hypertension, hyperlipidemia   Clinical Impression   PTA pt lived alone, reports being ind in mobility and ADLs but pt is a poor historian at baseline. Pt needing Mod to max A for ADLs and functional mobility, displays slow processing and impaired initiation skills. Pt would benefit from continued acute skilled OT services to address deficits and help transition to next level of care. Patient would benefit from post acute skilled rehab facility with <3 hours of therapy and 24/7 support       Recommendations for follow up therapy are one component of a multi-disciplinary discharge planning process, led by the attending physician.  Recommendations may be updated based on patient status, additional functional criteria and insurance authorization.   Assistance Recommended at Discharge Frequent or constant Supervision/Assistance  Patient can return home with the following A lot of help with walking and/or transfers;A lot of help with bathing/dressing/bathroom;Assistance with cooking/housework;Assist for transportation;Help with stairs or ramp for entrance;Direct supervision/assist for financial management;Direct supervision/assist for medications management    Functional Status Assessment  Patient has had a recent decline in their functional status and demonstrates the ability to make significant improvements in function in a reasonable and predictable amount of time.  Equipment Recommendations  Other (comment) (defer to next level of care)    Recommendations for Other  Services       Precautions / Restrictions Precautions Precautions: Fall Restrictions Weight Bearing Restrictions: Yes RLE Weight Bearing: Weight bearing as tolerated      Mobility Bed Mobility Overal bed mobility: Needs Assistance Bed Mobility: Rolling, Sidelying to Sit Rolling: Max assist Sidelying to sit: Mod assist       General bed mobility comments: Pt left in recliner, needed assist with sequencing and initiating bed mobiltiy    Transfers Overall transfer level: Needs assistance Equipment used: None Transfers: Sit to/from Stand, Bed to chair/wheelchair/BSC Sit to Stand: Mod assist Stand pivot transfers: Max assist                Balance Overall balance assessment: Needs assistance Sitting-balance support: Bilateral upper extremity supported, Feet supported Sitting balance-Leahy Scale: Poor Sitting balance - Comments: Pt with posterior lean, hard to gather balance EOB. Able to progress to min guard with cues for anterior weight shifting and upright posture Postural control: Posterior lean   Standing balance-Leahy Scale: Zero Standing balance comment: Requires support to gather balance                           ADL either performed or assessed with clinical judgement   ADL Overall ADL's : Needs assistance/impaired Eating/Feeding: Supervision/ safety;Set up;Bed level   Grooming: Minimal assistance;Sitting;Cueing for sequencing Grooming Details (indicate cue type and reason): Min A to maintain balance and with cueing Upper Body Bathing: Minimal assistance;Cueing for sequencing;Sitting   Lower Body Bathing: Moderate assistance;Sitting/lateral leans   Upper Body Dressing : Minimal assistance;Sitting Upper Body Dressing Details (indicate cue type and reason): Min A to maintain balance and with cueing Lower Body Dressing: Maximal assistance;Sitting/lateral leans   Toilet Transfer: Maximal assistance;Stand-pivot;BSC/3in1   Toileting- Clothing  Manipulation and Hygiene: Sit to/from stand;Moderate assistance         General ADL Comments: Pt not able to ambulate in today's OT session, unable to gather balance in staning     Vision         Perception     Praxis      Pertinent Vitals/Pain Pain Assessment Pain Assessment: Faces Faces Pain Scale: Hurts little more Pain Location: R hip/leg Pain Descriptors / Indicators: Aching, Discomfort, Guarding Pain Intervention(s): Monitored during session, Repositioned     Hand Dominance     Extremity/Trunk Assessment Upper Extremity Assessment Upper Extremity Assessment: Generalized weakness;Difficult to assess due to impaired cognition   Lower Extremity Assessment Lower Extremity Assessment: Defer to PT evaluation       Communication Communication Communication: Other (comment) (slow processing.)   Cognition Arousal/Alertness: Awake/alert Behavior During Therapy: Flat affect Overall Cognitive Status: No family/caregiver present to determine baseline cognitive functioning                                 General Comments: A&Ox4 but pt with deficits listed, slow processing and decreased motor planning, impaired iniation.     General Comments  VSS on RA    Exercises     Shoulder Instructions      Home Living Family/patient expects to be discharged to:: Other (Comment) (hotel) Living Arrangements: Alone   Type of Home: Other(Comment) (hotel) Home Access: Stairs to enter Entrance Stairs-Number of Steps: 16   Home Layout: One level (lives on first level per pt report)     Bathroom Shower/Tub: Chief Strategy Officer: Standard Bathroom Accessibility: Yes How Accessible: Accessible via walker Home Equipment: Grab bars - tub/shower   Additional Comments: Pt poor historian at baseline. Pt support system states he has a hx of vascular dementia      Prior Functioning/Environment Prior Level of Function : Independent/Modified  Independent                        OT Problem List: Decreased strength;Impaired balance (sitting and/or standing)      OT Treatment/Interventions: Self-care/ADL training;Therapeutic activities;Therapeutic exercise;DME and/or AE instruction;Balance training;Patient/family education    OT Goals(Current goals can be found in the care plan section) Acute Rehab OT Goals Patient Stated Goal: To get stronger OT Goal Formulation: With patient Time For Goal Achievement: 05/03/23 Potential to Achieve Goals: Good ADL Goals Pt Will Perform Grooming: sitting;with supervision Pt Will Perform Lower Body Bathing: sitting/lateral leans;with min guard assist Pt Will Perform Lower Body Dressing: with min guard assist;sitting/lateral leans Pt Will Transfer to Toilet: with min guard assist;ambulating;regular height toilet  OT Frequency: Min 1X/week       AM-PAC OT "6 Clicks" Daily Activity     Outcome Measure Help from another person eating meals?: A Little Help from another person taking care of personal grooming?: A Little Help from another person toileting, which includes using toliet, bedpan, or urinal?: A Lot Help from another person bathing (including washing, rinsing, drying)?: A Lot Help from another person to put on and taking off regular upper body clothing?: A Little Help from another person to put on and taking off regular lower body clothing?: A Lot 6 Click Score: 15   End of Session Equipment Utilized During Treatment: Gait belt Nurse Communication: Mobility status  Activity Tolerance: Patient tolerated treatment well Patient left: in chair;with call bell/phone within reach;with chair  alarm set  OT Visit Diagnosis: Unsteadiness on feet (R26.81);Other abnormalities of gait and mobility (R26.89);Muscle weakness (generalized) (M62.81);Pain Pain - Right/Left: Right Pain - part of body: Hip;Leg                Time: 0932-3557 OT Time Calculation (min): 26 min Charges:  OT  General Charges $OT Visit: 1 Visit OT Evaluation $OT Eval Moderate Complexity: 1 Mod OT Treatments $Therapeutic Activity: 8-22 mins  04/19/2023  AB, OTR/L  Acute Rehabilitation Services  Office: [phone]   Tristan Schroeder 04/19/2023, 12:20 PM

## 2023-04-19 NOTE — Progress Notes (Signed)
     Scott Pruitt is a 62 y.o. male   Orthopaedic diagnosis: Status post cephalomedullary nail of the right hip  Subjective: Patient is resting comfortably.  No complaints.  States his hip feels better today.  Objectyive: Vitals:   04/19/23 0452 04/19/23 0722  BP: 135/80 (!) 127/90  Pulse: 75 66  Resp: 15 18  Temp: 97.9 F (36.6 C) 98 F (36.7 C)  SpO2: 99% 100%     Exam: Awake and alert Respirations even and unlabored No acute distress  Right hip with dressing in place.  No drainage.  He is able to wiggle toes and move his ankle.  No tenderness distally along the thigh, knee or leg.  Assessment: Postop day 1 status post cephalomedullary nail of right hip fracture   Plan: Patient will mobilize with physical therapy.  He is weightbearing as tolerated.  Suitable for discharge from an orthopedic standpoint.  Patient is currently not taking pain medication so I did not send any to the pharmacy for discharge.    He may take aspirin for DVT prophylaxis on discharge.    Follow-up with me in 2 weeks.   Nicki Guadalajara, MD

## 2023-04-19 NOTE — Anesthesia Postprocedure Evaluation (Signed)
Anesthesia Post Note  Patient: Fayrene Fearing Schetter  Procedure(s) Performed: INTRAMEDULLARY (IM) NAIL INTERTROCHANTERIC (Right: Hip)     Patient location during evaluation: PACU Anesthesia Type: General Level of consciousness: awake and alert Pain management: pain level controlled Vital Signs Assessment: post-procedure vital signs reviewed and stable Respiratory status: spontaneous breathing, nonlabored ventilation, respiratory function stable and patient connected to nasal cannula oxygen Cardiovascular status: blood pressure returned to baseline and stable Postop Assessment: no apparent nausea or vomiting Anesthetic complications: no   No notable events documented.  Last Vitals:  Vitals:   04/19/23 0452 04/19/23 0722  BP: 135/80 (!) 127/90  Pulse: 75 66  Resp: 15 18  Temp: 36.6 C 36.7 C  SpO2: 99% 100%    Last Pain:  Vitals:   04/19/23 0452  TempSrc: Oral  PainSc:                  Mariaelena Cade S

## 2023-04-20 DIAGNOSIS — W19XXXA Unspecified fall, initial encounter: Secondary | ICD-10-CM | POA: Diagnosis not present

## 2023-04-20 DIAGNOSIS — E7800 Pure hypercholesterolemia, unspecified: Secondary | ICD-10-CM | POA: Diagnosis not present

## 2023-04-20 DIAGNOSIS — E876 Hypokalemia: Secondary | ICD-10-CM | POA: Insufficient documentation

## 2023-04-20 DIAGNOSIS — I10 Essential (primary) hypertension: Secondary | ICD-10-CM | POA: Diagnosis not present

## 2023-04-20 DIAGNOSIS — S72001A Fracture of unspecified part of neck of right femur, initial encounter for closed fracture: Secondary | ICD-10-CM | POA: Diagnosis not present

## 2023-04-20 MED ORDER — ASPIRIN 325 MG PO TABS: 325.0000 mg | ORAL_TABLET | Freq: Two times a day (BID) | ORAL | 0 refills | Status: DC

## 2023-04-20 MED ORDER — TIZANIDINE HCL 2 MG PO CAPS: 2.0000 mg | ORAL_CAPSULE | Freq: Three times a day (TID) | ORAL | 0 refills | Status: DC

## 2023-04-20 MED ORDER — POTASSIUM CHLORIDE CRYS ER 20 MEQ PO TBCR
40.0000 meq | EXTENDED_RELEASE_TABLET | Freq: Once | ORAL | Status: AC
  Administered 2023-04-20: 40 meq via ORAL
  Filled 2023-04-20: qty 2

## 2023-04-20 MED ORDER — CALCIUM CARBONATE 1250 (500 CA) MG PO TABS
1.0000 | ORAL_TABLET | Freq: Every day | ORAL | Status: DC
  Administered 2023-04-21 – 2023-05-11 (×21): 1250 mg via ORAL
  Filled 2023-04-20 (×23): qty 1

## 2023-04-20 MED ORDER — OXYCODONE HCL 5 MG PO TABS: 5.0000 mg | ORAL_TABLET | ORAL | 0 refills | Status: DC | PRN

## 2023-04-20 NOTE — Plan of Care (Signed)
  Problem: Health Behavior/Discharge Planning: Goal: Ability to manage health-related needs will improve Outcome: Progressing   Problem: Nutrition: Goal: Adequate nutrition will be maintained Outcome: Progressing   Problem: Coping: Goal: Level of anxiety will decrease Outcome: Progressing   Problem: Elimination: Goal: Will not experience complications related to bowel motility Outcome: Progressing Goal: Will not experience complications related to urinary retention Outcome: Progressing   Problem: Pain Managment: Goal: General experience of comfort will improve Outcome: Progressing

## 2023-04-20 NOTE — Assessment & Plan Note (Signed)
-   Supp vit D and calcium

## 2023-04-20 NOTE — Plan of Care (Signed)
  Problem: Clinical Measurements: Goal: Will remain free from infection Outcome: Progressing   Problem: Elimination: Goal: Will not experience complications related to bowel motility Outcome: Progressing Goal: Will not experience complications related to urinary retention Outcome: Progressing   Problem: Pain Managment: Goal: General experience of comfort will improve Outcome: Progressing   

## 2023-04-20 NOTE — TOC Progression Note (Signed)
Transition of Care [facility]) - Progression Note    Patient Details  Name: Scott Pruitt MRN: [id number] Date of Birth: Jul 25, 1961  Transition of Care [facility]) CM/SW Contact  Jimmy Picket, [place] Phone Number: 04/20/2023, 11:32 AM  Clinical Narrative:     Pt has no bed offers at this time.       Expected Discharge Plan and Services                                               Social Determinants of Health (SDOH) Interventions SDOH Screenings   Food Insecurity: Food Insecurity Present (04/18/2023)  Housing: Low Risk  (04/18/2023)  Transportation Needs: No Transportation Needs (04/18/2023)  Depression (PHQ2-9): Medium Risk (03/11/2020)  Social Connections: Unknown (02/02/2022)   Received from [facility]  Tobacco Use: Medium Risk (04/18/2023)    Readmission Risk Interventions     No data to display

## 2023-04-20 NOTE — Assessment & Plan Note (Signed)
Supplemented 

## 2023-04-20 NOTE — Progress Notes (Signed)
  Progress Note   Patient: Scott Pruitt ZOX:[id number] DOB: April 01, 1961 DOA: 04/17/2023     3 DOS: the patient was seen and examined on 04/20/2023 at 10:03 AM      Brief hospital course: Scott Pruitt is a 62 y.o. M with HTN, HLD who presented after a fall, found to have right hip fracture.     Assessment and Plan: * Closed right hip fracture (HCC) S/p Cephalomedullary nail of right hip fracture by Dr. Susa Simmonds on 7/25 - Consult Orthopedics - To the OR today for repair    Hyponatremia Na mild.   - Continue hydrochlorothiazide for now  Transaminitis Denies EtOH.   - Check hepatitis serologies  Fall    Hyperlipidemia Not on statin at present  Essential hypertension BP controlled - Continue amlodipine, lisinopril, HCTZ          Subjective: Patient sleeping, he has no complaints.  He has some moderate right hip pain, but expected postop.  No confusion, fever, cough, dyspnea, chest pain.     Physical Exam: BP (!) 125/95   Pulse 89   Temp 98.1 F (36.7 C)   Resp 16   Ht 5\' 9"  (1.753 m)   Wt 74.1 kg   SpO2 100%   BMI 24.12 kg/m   Frail elderly adult male, lying in bed, appears debilitated and weak RRR, no murmurs, no peripheral edema Respiratory rate normal, lungs clear without rales or wheezes Abdomen soft [place] to palpation Amblyopia, face otherwise symmetric, speech fluent but hypophonic and halting, appears overall severely weak and tired, guarding the right hip   Data Reviewed: Patient metabolic panel shows mild hypokalemia, mild hyponatremia, stable renal function CBC unremarkable, hemoglobin stable.  Family Communication: Significant other by phone    Disposition: Status is: Inpatient The patient is admitted for hip fracture, has undergone repair.  He is stable for discharge from a medical standpoint, but because of his hip repair he is unable to stand, lives alone, is unable to return to his prior establishment safely alone.  He will need  rehabilitation prior to returning home        Author: Alberteen Sam, MD 04/20/2023 3:45 PM  For on call review [email or web address].

## 2023-04-20 NOTE — Progress Notes (Signed)
Subjective: 2 Days Post-Op Procedure(s) (LRB): INTRAMEDULLARY (IM) NAIL INTERTROCHANTERIC (Right) Patient reports pain as mild.    Objective: Vital signs in last 24 hours: Temp:  [98 F (36.7 C)-98.3 F (36.8 C)] 98.1 F (36.7 C) (07/27 0808) Pulse Rate:  [89-99] 89 (07/27 0808) Resp:  [16-18] 16 (07/27 0808) BP: (125-132)/(77-95) 125/95 (07/27 0808) SpO2:  [91 %-100 %] 100 % (07/27 0808) Weight:  [74.1 kg] 74.1 kg (07/27 0530)  Intake/Output from previous day: 07/26 0701 - 07/27 0700 In: 720 [P.O.:720] Out: 200 [Urine:200] Intake/Output this shift: No intake/output data recorded.  Recent Labs    04/17/23 2042 04/18/23 0043 04/19/23 0256 04/20/23 0243  HGB 14.1 14.2 11.2* 11.8*   Recent Labs    04/19/23 0256 04/20/23 0243  WBC 9.5 11.0*  RBC 3.80* 3.95*  HCT 33.9* 36.0*  PLT 156 198   Recent Labs    04/19/23 0256 04/20/23 0243  NA 132* 133*  K 4.3 3.2*  CL 95* 92*  CO2 29 29  BUN 30* 27*  CREATININE 1.08 0.98  GLUCOSE 118* 100*  CALCIUM 8.8* 8.7*   Recent Labs    04/18/23 0043  INR 1.2    Neurologically intact ABD soft Neurovascular intact Sensation intact distally Intact pulses distally Dorsiflexion/Plantar flexion intact Incision: dressing C/D/I   Assessment/Plan: 2 Days Post-Op Procedure(s) (LRB): INTRAMEDULLARY (IM) NAIL INTERTROCHANTERIC (Right) -Continue to mobilize with PT. -Patient ready for discharge from an orthopaedic perspective. -ASA 325mg  po bid for DVT prophylaxis -Clinic follow up in 2 weeks.  Shanon Payor 04/20/2023, 8:21 AM

## 2023-04-21 DIAGNOSIS — I10 Essential (primary) hypertension: Secondary | ICD-10-CM | POA: Diagnosis not present

## 2023-04-21 DIAGNOSIS — W19XXXA Unspecified fall, initial encounter: Secondary | ICD-10-CM | POA: Diagnosis not present

## 2023-04-21 DIAGNOSIS — E7800 Pure hypercholesterolemia, unspecified: Secondary | ICD-10-CM | POA: Diagnosis not present

## 2023-04-21 DIAGNOSIS — S72001A Fracture of unspecified part of neck of right femur, initial encounter for closed fracture: Secondary | ICD-10-CM | POA: Diagnosis not present

## 2023-04-21 LAB — COMPREHENSIVE METABOLIC PANEL WITH GFR
ALT: 36 U/L (ref 0–44)
AST: 37 U/L (ref 15–41)
Albumin: 2.5 g/dL — ABNORMAL LOW (ref 3.5–5.0)
Alkaline Phosphatase: 59 U/L (ref 38–126)
Anion gap: 8 (ref 5–15)
BUN: 18 mg/dL (ref 8–23)
CO2: 29 mmol/L (ref 22–32)
Calcium: 8.4 mg/dL — ABNORMAL LOW (ref 8.9–10.3)
Chloride: 91 mmol/L — ABNORMAL LOW (ref 98–111)
Creatinine, Ser: 0.82 mg/dL (ref 0.61–1.24)
GFR, Estimated: >60 mL/min (ref >60)
Glucose, Bld: 100 mg/dL — ABNORMAL HIGH (ref 70–99)
Potassium: 3.6 mmol/L (ref 3.5–5.1)
Sodium: 128 mmol/L — ABNORMAL LOW (ref 135–145)
Total Bilirubin: 0.7 mg/dL (ref 0.3–1.2)
Total Protein: 5.6 g/dL — ABNORMAL LOW (ref 6.5–8.1)

## 2023-04-21 LAB — CBC
HCT: 30.4 % — ABNORMAL LOW (ref 39.0–52.0)
Hemoglobin: 10.3 g/dL — ABNORMAL LOW (ref 13.0–17.0)
MCH: 29.3 pg (ref 26.0–34.0)
MCHC: 33.9 g/dL (ref 30.0–36.0)
MCV: 86.6 fL (ref 80.0–100.0)
Platelets: 186 10*3/uL (ref 150–400)
RBC: 3.51 MIL/uL — ABNORMAL LOW (ref 4.22–5.81)
RDW: 12.7 % (ref 11.5–15.5)
WBC: 8.9 10*3/uL (ref 4.0–10.5)
nRBC: 0 % (ref 0.0–0.2)

## 2023-04-21 NOTE — Plan of Care (Signed)
  Problem: Health Behavior/Discharge Planning: Goal: Ability to manage health-related needs will improve Outcome: Progressing   Problem: Activity: Goal: Risk for activity intolerance will decrease Outcome: Progressing   Problem: Nutrition: Goal: Adequate nutrition will be maintained Outcome: Progressing   

## 2023-04-21 NOTE — Plan of Care (Signed)

## 2023-04-21 NOTE — TOC Progression Note (Signed)
Transition of Care [facility]) - Progression Note    Patient Details  Name: Scott Pruitt MRN: [id number] Date of Birth: [birth year]  Transition of Care [facility]) CM/SW Contact  Jimmy Picket, [place] Phone Number: 04/21/2023, 3:26 PM  Clinical Narrative:     Member has no bed offers at this time.        Expected Discharge Plan and Services                                               Social Determinants of Health (SDOH) Interventions SDOH Screenings   Food Insecurity: Food Insecurity Present (04/18/2023)  Housing: Low Risk  (04/18/2023)  Transportation Needs: No Transportation Needs (04/18/2023)  Depression (PHQ2-9): Medium Risk (03/11/2020)  Social Connections: Unknown (02/02/2022)   Received from [facility]  Tobacco Use: Medium Risk (04/18/2023)    Readmission Risk Interventions     No data to display

## 2023-04-21 NOTE — Progress Notes (Signed)
  Progress Note   Patient: Scott Pruitt VWU:[id number] DOB: 1960/11/30 DOA: 04/17/2023     4 DOS: the patient was seen and examined on 04/21/2023        Brief hospital course: Mr. Scott Pruitt is a 62 y.o. M with HTN, HLD who presented after a fall, found to have right hip fracture.     Assessment and Plan: * Closed right hip fracture (HCC) S/p Cephalomedullary nail of right hip fracture by Dr. Susa Simmonds on 7/25 - Consult Orthopedics - WBAT - Aspirin at discharge    Hypokalemia Supplemented  Vitamin D deficiency - Supp vit D and calcium  Hyponatremia Mild asymptomatic - Hold HCTZ  Transaminitis Denies EtOH.  Viral serologies negative. Transaminitis resolved to normal.  Fall    Hyperlipidemia Not on statin at present  Essential hypertension BP controlled - Continue amlodipine, lisinopril           Subjective: Patient sleeping, he has no other new complaints.  No nursing concerns, no fever.     Physical Exam: BP 132/79 (BP Location: Right Arm)   Pulse 78   Temp 97.9 F (36.6 C) (Oral)   Resp 17   Ht 5\' 9"  (1.753 m)   Wt 74.1 kg   SpO2 98%   BMI 24.12 kg/m   Elderly frail adult male, lying in bed RRR, no murmurs, no peripheral edema Respiratory rate normal, lungs clear without rales or wheezes Abdomen soft without tenderness palpation Attention diminished, affect blunted, judgment insight appear normal, oriented to person, place, and time generalized weakness in the upper extremities but symmetric    Data Reviewed: Basic metabolic panel shows sodium down to 128, renal function stable, transaminitis resolved CBC shows mild anemia, no significant change from yesterday       Disposition: Status is: Inpatient Medically read yfor discharge, will need SNF at discharge due to inability to ambulate        Author: Alberteen Sam, MD 04/21/2023 11:57 AM  For on call review [email or web address].

## 2023-04-21 NOTE — Progress Notes (Signed)
    Patient doing well PO day 3 S/P R IM nail by Dr. Susa Simmonds. Minimal pain, slow progress in PT, awaiting SNF. He has NL appetite, B/B function    Physical Exam: Vitals:   04/21/23 0605 04/21/23 0826  BP: 130/80 132/79  Pulse: 80 78  Resp: 16 17  Temp: 98.2 F (36.8 C) 97.9 F (36.6 C)  SpO2: 98% 98%    Dressing in place, CDI, sitting up in bed eating lunch comfortably, NVI  POD #3 s/p R IM nail by Dr. Susa Simmonds  - up with PT/OT, encourage ambulation - Patient ready for discharge from an orthopaedic perspective  -Awaiting SNF bed - ASA 325mg  po bid for DVT prophylaxis - Clinic follow up in 2 weeks

## 2023-04-21 NOTE — Consult Note (Signed)
WOC Nurse Consult Note: Reason for Consult:abrasion to medial left thigh  Wound type:trauma, partial thickness Pressure Injury POA: N/A Measurement:4cm x 3cm x 0.1cm Wound bed:red, moist Drainage (amount, consistency, odor) none Periwound:intact Dressing procedure/placement/frequency: I have provided nursing with every other day guidance for the care of this area using a saline cleanse followed by covering with a silicone foam dressing.  WOC nursing team will not follow, but will remain available to this patient, the nursing and medical teams.  Please re-consult if needed.  Thank you for inviting [place] to participate in this patient's Plan of Care.  Ladona Mow, MSN, RN, CNS, GNP, Leda Min, [employer], [employer] phone:  [phone]

## 2023-04-22 DIAGNOSIS — I10 Essential (primary) hypertension: Secondary | ICD-10-CM | POA: Diagnosis not present

## 2023-04-22 DIAGNOSIS — W19XXXA Unspecified fall, initial encounter: Secondary | ICD-10-CM | POA: Diagnosis not present

## 2023-04-22 DIAGNOSIS — S72001A Fracture of unspecified part of neck of right femur, initial encounter for closed fracture: Secondary | ICD-10-CM | POA: Diagnosis not present

## 2023-04-22 DIAGNOSIS — E7800 Pure hypercholesterolemia, unspecified: Secondary | ICD-10-CM | POA: Diagnosis not present

## 2023-04-22 NOTE — Plan of Care (Signed)

## 2023-04-22 NOTE — Progress Notes (Signed)
Occupational Therapy Treatment Patient Details Name: Scott Pruitt MRN: [id number] DOB: 07-Aug-1961 Today's Date: 04/22/2023   History of present illness 62 y.o. male admitted to [facility] on 04/17/2023 with acute right hip fracture after ground-level mechanical fall after presenting from home to [facility] ED complaining of right hip pain. He is s/p nailing of R hip fx 04/18/23. Past medical history significant for essential hypertension, hyperlipidemia   OT comments  Patient making progress with OT treatment with mod assist of one to get to EOB with use of bed rails and assistance for BLEs and trunk. Patient able to stand from EOB to RW with max assist and unable to advance LLE to perform transfer. Patient able to tolerate stand pivot transfer with face to face technique. Self care tasks performed seated in recliner. Patient will benefit from continued inpatient follow up therapy, <3 hours/day to address LB bathing and dressing and functional transfers. Acute OT to continue to follow.    Recommendations for follow up therapy are one component of a multi-disciplinary discharge planning process, led by the attending physician.  Recommendations may be updated based on patient status, additional functional criteria and insurance authorization.    Assistance Recommended at Discharge Frequent or constant Supervision/Assistance  Patient can return home with the following  A lot of help with walking and/or transfers;A lot of help with bathing/dressing/bathroom;Assistance with cooking/housework;Assist for transportation;Help with stairs or ramp for entrance;Direct supervision/assist for financial management;Direct supervision/assist for medications management   Equipment Recommendations  Other (comment) (defer)    Recommendations for Other Services      Precautions / Restrictions Precautions Precautions: Fall Restrictions Weight Bearing Restrictions: Yes RLE Weight Bearing: Weight bearing as  tolerated       Mobility Bed Mobility Overal bed mobility: Needs Assistance Bed Mobility: Supine to Sit     Supine to sit: Mod assist, HOB elevated     General bed mobility comments: assistance to get LEs off EOB and raise trunk with HOB elevated    Transfers Overall transfer level: Needs assistance Equipment used: None Transfers: Sit to/from Stand, Bed to chair/wheelchair/BSC Sit to Stand: Max assist Stand pivot transfers: Max assist         General transfer comment: attempted step pivot transfer with RW with patient unable to advance LLE, stand pivot transfers performed with face to face technique     Balance Overall balance assessment: Needs assistance Sitting-balance support: Feet supported, Single extremity supported Sitting balance-Leahy Scale: Poor Sitting balance - Comments: min guard and one extremity support seated on EOB   Standing balance support: Bilateral upper extremity supported Standing balance-Leahy Scale: Poor Standing balance comment: stood from EOB with RW with max assist                           ADL either performed or assessed with clinical judgement   ADL Overall ADL's : Needs assistance/impaired     Grooming: Wash/dry hands;Wash/dry face;Brushing hair;Supervision/safety;Sitting Grooming Details (indicate cue type and reason): in recliner             Lower Body Dressing: Maximal assistance;Sitting/lateral leans   Toilet Transfer: Maximal assistance;Stand-pivot Toilet Transfer Details (indicate cue type and reason): simulated to recliner           General ADL Comments: limited standing due to pain    Extremity/Trunk Assessment              Vision       Perception  Praxis      Cognition Arousal/Alertness: Awake/alert Behavior During Therapy: Flat affect Overall Cognitive Status: No family/caregiver present to determine baseline cognitive functioning                                  General Comments: slow processing, alert and oriented        Exercises      Shoulder Instructions       General Comments      Pertinent Vitals/ Pain       Pain Assessment Pain Assessment: Faces Faces Pain Scale: Hurts little more Pain Location: R hip/leg Pain Descriptors / Indicators: Aching, Discomfort, Guarding Pain Intervention(s): Limited activity within patient's tolerance, Monitored during session, Repositioned  Home Living                                          Prior Functioning/Environment              Frequency  Min 1X/week        Progress Toward Goals  OT Goals(current goals can now be found in the care plan section)  Progress towards OT goals: Progressing toward goals  Acute Rehab OT Goals Patient Stated Goal: none stated OT Goal Formulation: With patient Time For Goal Achievement: 05/03/23 Potential to Achieve Goals: Good ADL Goals Pt Will Perform Grooming: sitting;with supervision Pt Will Perform Lower Body Bathing: sitting/lateral leans;with min guard assist Pt Will Perform Lower Body Dressing: with min guard assist;sitting/lateral leans Pt Will Transfer to Toilet: with min guard assist;ambulating;regular height toilet  Plan Discharge plan remains appropriate    Co-evaluation                 AM-PAC OT "6 Clicks" Daily Activity     Outcome Measure   Help from another person eating meals?: A Little Help from another person taking care of personal grooming?: A Little Help from another person toileting, which includes using toliet, bedpan, or urinal?: A Lot Help from another person bathing (including washing, rinsing, drying)?: A Lot Help from another person to put on and taking off regular upper body clothing?: A Little Help from another person to put on and taking off regular lower body clothing?: A Lot 6 Click Score: 15    End of Session Equipment Utilized During Treatment: Gait belt;Rolling walker (2  wheels)  OT Visit Diagnosis: Unsteadiness on feet (R26.81);Other abnormalities of gait and mobility (R26.89);Muscle weakness (generalized) (M62.81);Pain Pain - Right/Left: Right Pain - part of body: Hip;Leg   Activity Tolerance Patient tolerated treatment well   Patient Left in chair;with call bell/phone within reach;with chair alarm set   Nurse Communication Mobility status        Time: 4010-2725 OT Time Calculation (min): 25 min  Charges: OT General Charges $OT Visit: 1 Visit OT Treatments $Self Care/Home Management : 8-22 mins $Therapeutic Activity: 8-22 mins  Alfonse Flavors, OTA Acute Rehabilitation Services  Office [phone]   Dewain Penning 04/22/2023, 9:50 AM

## 2023-04-22 NOTE — Progress Notes (Signed)
Physical Therapy Treatment Patient Details Name: Scott Pruitt MRN: [id number] DOB: 08-14-1961 Today's Date: 04/22/2023   History of Present Illness 62 y.o. male admitted to [facility] on 04/17/2023 with acute right hip fracture after ground-level mechanical fall after presenting from home to [facility] ED complaining of right hip pain. He is s/p nailing of R hip fx 04/18/23. Past medical history significant for essential hypertension, hyperlipidemia    PT Comments  Pt with fair tolerance to treatment today. Pt assisted back to bed with nursing staff. Pt continues to require Max A for stand pivot transfer and +2 total A for going from sit to supine. No change in DC/DME recs at this time. PT will continue to follow.   If plan is discharge home, recommend the following: Two people to help with walking and/or transfers;A lot of help with bathing/dressing/bathroom;Assistance with cooking/housework;Direct supervision/assist for medications management;Assist for transportation;Help with stairs or ramp for entrance   Can travel by private vehicle     No  Equipment Recommendations  Other (comment) (Per accepting facility)    Recommendations for Other Services       Precautions / Restrictions Precautions Precautions: Fall Restrictions Weight Bearing Restrictions: Yes RLE Weight Bearing: Weight bearing as tolerated     Mobility  Bed Mobility Overal bed mobility: Needs Assistance Bed Mobility: Sit to Supine       Sit to supine: Total assist, +2 for physical assistance   General bed mobility comments: +2 Total assistance to go bakc supine    Transfers Overall transfer level: Needs assistance Equipment used: None Transfers: Sit to/from Stand, Bed to chair/wheelchair/BSC Sit to Stand: Max assist Stand pivot transfers: Max assist         General transfer comment: Pt unable to step. Max A stand pivot from chair to bed.    Ambulation/Gait               General Gait Details:  pt unable   Stairs             Wheelchair Mobility     Tilt Bed    Modified Rankin (Stroke Patients Only)       Balance Overall balance assessment: Needs assistance Sitting-balance support: Feet supported, Single extremity supported Sitting balance-Leahy Scale: Poor Sitting balance - Comments: min guard and one extremity support seated on EOB   Standing balance support: Bilateral upper extremity supported Standing balance-Leahy Scale: Poor Standing balance comment: stood from EOB with RW with max assist                            Cognition Arousal/Alertness: Awake/alert Behavior During Therapy: Flat affect Overall Cognitive Status: No family/caregiver present to determine baseline cognitive functioning                                 General Comments: slow processing, alert and oriented        Exercises      General Comments General comments (skin integrity, edema, etc.): VSS on RA      Pertinent Vitals/Pain Pain Assessment Pain Assessment: Faces Faces Pain Scale: Hurts little more Pain Location: R hip/leg Pain Descriptors / Indicators: Aching, Discomfort, Guarding Pain Intervention(s): Limited activity within patient's tolerance, Monitored during session, Repositioned    Home Living  Prior Function            PT Goals (current goals can now be found in the care plan section) Progress towards PT goals: Progressing toward goals    Frequency    Min 1X/week      PT Plan Current plan remains appropriate    Co-evaluation              AM-PAC PT "6 Clicks" Mobility   Outcome Measure  Help needed turning from your back to your side while in a flat bed without using bedrails?: A Lot Help needed moving from lying on your back to sitting on the side of a flat bed without using bedrails?: A Lot Help needed moving to and from a bed to a chair (including a wheelchair)?: A Lot Help  needed standing up from a chair using your arms (e.g., wheelchair or bedside chair)?: A Lot Help needed to walk in hospital room?: Total Help needed climbing 3-5 steps with a railing? : Total 6 Click Score: 10    End of Session Equipment Utilized During Treatment: Gait belt Activity Tolerance: Patient limited by pain Patient left: in bed;with call bell/phone within reach;with nursing/sitter in room Nurse Communication: Mobility status PT Visit Diagnosis: Other abnormalities of gait and mobility (R26.89)     Time: 8657-8469 PT Time Calculation (min) (ACUTE ONLY): 8 min  Charges:    $Therapeutic Activity: 8-22 mins PT General Charges $$ ACUTE PT VISIT: 1 Visit                     Shela Nevin, PT, DPT Acute Rehab Services [phone]    [name] 04/22/2023, 1:53 PM

## 2023-04-22 NOTE — Plan of Care (Signed)
  Problem: Education: Goal: Knowledge of General Education information will improve Description: Including pain rating scale, medication(s)/side effects and non-pharmacologic comfort measures Outcome: Progressing   Problem: Health Behavior/Discharge Planning: Goal: Ability to manage health-related needs will improve Outcome: Progressing   Problem: Activity: Goal: Risk for activity intolerance will decrease Outcome: Progressing   

## 2023-04-22 NOTE — Progress Notes (Signed)
  Progress Note   Patient: Scott Pruitt ZOX:[id number] DOB: August 19, 1961 DOA: 04/17/2023     5 DOS: the patient was seen and examined on 04/22/2023 at 10:12 AM      Brief hospital course: Scott Pruitt is a 62 y.o. M with HTN, HLD who presented after a fall, found to have right hip fracture.     Assessment and Plan: * Closed right hip fracture (HCC) S/p Cephalomedullary nail of right hip fracture by Dr. Susa Simmonds on 7/25 - Consult Orthopedics - WBAT - Aspirin at discharge   Hyponatremia - Continue HCTZ  Essential hypertension BP controlled - Continue amlodipine, lisinopril, hydrochlorothiazide           Subjective: Patient has no complaints, he is little sleepy from pain medicine, and overall tired, and has had pain, but no headache, chest pain, dyspnea, focal weakness.  No new nursing concerns.     Physical Exam: BP 122/71 (BP Location: Right Arm)   Pulse 80   Temp 98.3 F (36.8 C) (Oral)   Resp 17   Ht 5\' 9"  (1.753 m)   Wt 72.6 kg   SpO2 99%   BMI 23.64 kg/m   Thin frail elderly male, sitting up in recliner RRR, no murmurs, no peripheral edema Respiratory rate normal, lungs clear without rales or wheezes Abdomen soft no tenderness palpation Attention diminished, affect blunted, somnolent, face symmetric, amblyopia stable, upper extremity strength generalized weak but symmetric    Data Reviewed: Sodium up to 130  Family Communication: None present    Disposition: Status is: Inpatient The patient has had a hip fracture and repair, he is unable to stand or walk due to pain, he will need skilled nursing rehabilitation        Author: Alberteen Sam, MD 04/22/2023 2:39 PM  For on call review [email or web address].

## 2023-04-23 DIAGNOSIS — I10 Essential (primary) hypertension: Secondary | ICD-10-CM | POA: Diagnosis not present

## 2023-04-23 DIAGNOSIS — S72001A Fracture of unspecified part of neck of right femur, initial encounter for closed fracture: Secondary | ICD-10-CM | POA: Diagnosis not present

## 2023-04-23 DIAGNOSIS — W19XXXA Unspecified fall, initial encounter: Secondary | ICD-10-CM | POA: Diagnosis not present

## 2023-04-23 DIAGNOSIS — E7800 Pure hypercholesterolemia, unspecified: Secondary | ICD-10-CM | POA: Diagnosis not present

## 2023-04-23 NOTE — Plan of Care (Signed)

## 2023-04-23 NOTE — Progress Notes (Signed)
  Progress Note   Patient: Scott Pruitt ZOX:[id number] DOB: 16-Mar-1961 DOA: 04/17/2023     6 DOS: the patient was seen and examined on 04/23/2023 at 10:12 AM      Brief hospital course: Scott Pruitt is a 63 y.o. M with HTN, HLD who presented after a fall, found to have right hip fracture.     Assessment and Plan: * Closed right hip fracture (HCC) S/p Cephalomedullary nail of right hip fracture by Dr. Susa Simmonds on 7/25 - Consult Orthopedics - WBAT - Aspirin at discharge   Hyponatremia -Hold HCTZ  Essential hypertension BP normal - Continue amlodipine and lisinopril - Hold HCTZ          Subjective: No new complaints, no nursing concerns sore in the hip, no headache, dyspnea, chest pain.    Physical Exam: BP 135/78 (BP Location: Right Arm)   Pulse 68   Temp 97.8 F (36.6 C) (Oral)   Resp 18   Ht 5\' 9"  (1.753 m)   Wt 72.3 kg   SpO2 97%   BMI 23.54 kg/m   Adult male, lying in bed, awakes and is interactive and falls back asleep RRR, no murmurs, no peripheral edema Respiratory rate normal, lungs clear without rales or wheezes Abdomen soft without tenderness palpation Amblyopia unchanged, moves upper extremities with normal strength and coordination, speech fluent, oriented to person, place, and time     Data Reviewed: metabolic panel shows sodium up to 132 CBC shows stable mild anemia  Family Communication: None present    Disposition: Status is: Inpatient The patient has had a hip fracture and repair, he is unable to stand or walk due to pain, he will need skilled nursing rehabilitation        Author: Alberteen Sam, MD 04/23/2023 4:22 PM  For on call review [email or web address].

## 2023-04-23 NOTE — TOC Progression Note (Addendum)
Transition of Care [facility]) - Progression Note    Patient Details  Name: Scott Pruitt MRN: [id number] Date of Birth: September 10, 1961  Transition of Care [facility]) CM/SW Contact  Lorri Frederick, LCSW Phone Number: 04/23/2023, 11:58 AM  Clinical Narrative:   CSW informed that [facility] and [facility] are only 2 SNFs in the area that can accept [employer].  CSW reached out to Deborah/[facility]--she is not sure if they accept ambetter, will check with her admin.  1130: CSW spoke with Amanda/[facility].  She confirmed that they do accept [employer], they are not on the hub.  Referral faxed.   1245: TC [facility].  CSW discussed pt has medicaid secondary to his [employer].  Per Alphonzo Lemmings, in order for medicaid to be used as SNF payer, would need denial from [employer].  [employer] will only issue denial to an in network ambetter facility, so out of network SNF will not be able to use the medicaid.          Expected Discharge Plan and Services                                               Social Determinants of Health (SDOH) Interventions SDOH Screenings   Food Insecurity: Food Insecurity Present (04/18/2023)  Housing: Low Risk  (04/18/2023)  Transportation Needs: No Transportation Needs (04/18/2023)  Depression (PHQ2-9): Medium Risk (03/11/2020)  Social Connections: Unknown (02/02/2022)   Received from [facility]  Tobacco Use: Medium Risk (04/18/2023)    Readmission Risk Interventions     No data to display

## 2023-04-23 NOTE — Plan of Care (Signed)
  Problem: Health Behavior/Discharge Planning: Goal: Ability to manage health-related needs will improve 04/23/2023 0332 by Geralynn Ochs, RN Outcome: Progressing 04/23/2023 0310 by Geralynn Ochs, RN Outcome: Progressing   Problem: Activity: Goal: Risk for activity intolerance will decrease 04/23/2023 0332 by Geralynn Ochs, RN Outcome: Progressing 04/23/2023 0310 by Geralynn Ochs, RN Outcome: Progressing   Problem: Safety: Goal: Ability to remain free from injury will improve Outcome: Progressing

## 2023-04-24 DIAGNOSIS — E7800 Pure hypercholesterolemia, unspecified: Secondary | ICD-10-CM | POA: Diagnosis not present

## 2023-04-24 DIAGNOSIS — W19XXXA Unspecified fall, initial encounter: Secondary | ICD-10-CM | POA: Diagnosis not present

## 2023-04-24 DIAGNOSIS — I10 Essential (primary) hypertension: Secondary | ICD-10-CM | POA: Diagnosis not present

## 2023-04-24 DIAGNOSIS — S72001A Fracture of unspecified part of neck of right femur, initial encounter for closed fracture: Secondary | ICD-10-CM | POA: Diagnosis not present

## 2023-04-24 NOTE — Progress Notes (Signed)
Occupational Therapy Treatment Patient Details Name: Scott Pruitt MRN: [id number] DOB: [birth year] Today's Date: 04/24/2023   History of present illness 62 y.o. male admitted to [facility] on 04/17/2023 with acute right hip fracture after ground-level mechanical fall after presenting from home to [facility] ED complaining of right hip pain. He is s/p nailing of R hip fx 04/18/23. Past medical history significant for essential hypertension, hyperlipidemia   OT comments  Pt continues to have challenges with gathering balance and initiating steps. Pt needing Max A +2 for upright standing posture and to initiate steps but he is unable to offload BLEs independently. Pt continues to be confused, he has been screaming out in the hallway all day when needing his legs repositioned although pt is educated on the use of call bell. He needs assist moving RLE with bed mobility, muscles were seen engaging but not producing enough strength, his LLE is also weak and has trouble with mobility. OT to continue to progress pt as able, DC plans remain appropriate for SNF.   Recommendations for follow up therapy are one component of a multi-disciplinary discharge planning process, led by the attending physician.  Recommendations may be updated based on patient status, additional functional criteria and insurance authorization.    Assistance Recommended at Discharge Frequent or constant Supervision/Assistance  Patient can return home with the following  A lot of help with walking and/or transfers;A lot of help with bathing/dressing/bathroom;Assistance with cooking/housework;Assist for transportation;Help with stairs or ramp for entrance;Direct supervision/assist for financial management;Direct supervision/assist for medications management   Equipment Recommendations  Other (comment) (defer)    Recommendations for Other Services      Precautions / Restrictions Precautions Precautions: Fall Restrictions Weight Bearing  Restrictions: Yes RLE Weight Bearing: Weight bearing as tolerated       Mobility Bed Mobility Overal bed mobility: Needs Assistance Bed Mobility: Supine to Sit, Sidelying to Sit   Sidelying to sit: Min guard Supine to sit: Max assist     General bed mobility comments: Max A for BLE management. Min G for sielying to sit from elevated HOB.    Transfers Overall transfer level: Needs assistance Equipment used: 2 person hand held assist Transfers: Sit to/from Stand, Bed to chair/wheelchair/BSC Sit to Stand: +2 physical assistance, Max assist     Step pivot transfers: +2 physical assistance, Max assist     General transfer comment: Pt able to take a few steps today with +2 Max A and Max verbal cues for sequecing. Pt with continued slow processing and difficulty follow directions.     Balance Overall balance assessment: Needs assistance Sitting-balance support: Feet supported, Single extremity supported Sitting balance-Leahy Scale: Poor Sitting balance - Comments: min guard and one extremity support seated on EOB Postural control: Posterior lean Standing balance support: Bilateral upper extremity supported Standing balance-Leahy Scale: Poor Standing balance comment: reliant on therapist                           ADL either performed or assessed with clinical judgement   ADL                                         General ADL Comments: Focused session on progressing OOB mobility with PT, pt needing manual facilitation to shift hips forward and verbal cues for upright standing posture. Pt not able to offload BLEs,  OT/PT providing manual movement and and weight shifts of BLEs to promote offloading but pt not able to gather balance to perform task.    Extremity/Trunk Assessment              Vision       Perception     Praxis      Cognition Arousal/Alertness: Awake/alert Behavior During Therapy: Flat affect Overall Cognitive Status: No  family/caregiver present to determine baseline cognitive functioning                                 General Comments: slow processing, alert and oriented, continuous screaming to get attention of staff but pt informed on the use of call bell (which is positioned next to him)        Exercises      Shoulder Instructions       General Comments VSS    Pertinent Vitals/ Pain       Pain Assessment Pain Assessment: 0-10 Pain Score: 10-Worst pain ever Pain Location: R hip/leg Pain Descriptors / Indicators: Aching, Discomfort, Guarding, Moaning Pain Intervention(s): Monitored during session, Limited activity within patient's tolerance, Premedicated before session, Patient requesting pain meds-RN notified  Home Living                                          Prior Functioning/Environment              Frequency  Min 1X/week        Progress Toward Goals  OT Goals(current goals can now be found in the care plan section)  Progress towards OT goals: Not progressing toward goals - comment (unsure if limited by cog or if weakness)  Acute Rehab OT Goals OT Goal Formulation: With patient Time For Goal Achievement: 05/03/23 Potential to Achieve Goals: Good  Plan Discharge plan remains appropriate;Frequency remains appropriate    Co-evaluation      Reason for Co-Treatment: Necessary to address cognition/behavior during functional activity;For patient/therapist safety PT goals addressed during session: Mobility/safety with mobility;Proper use of DME OT goals addressed during session: ADL's and self-care;Strengthening/ROM      AM-PAC OT "6 Clicks" Daily Activity     Outcome Measure   Help from another person eating meals?: A Little Help from another person taking care of personal grooming?: A Little Help from another person toileting, which includes using toliet, bedpan, or urinal?: A Lot Help from another person bathing (including washing,  rinsing, drying)?: A Lot Help from another person to put on and taking off regular upper body clothing?: A Little Help from another person to put on and taking off regular lower body clothing?: A Lot 6 Click Score: 15    End of Session Equipment Utilized During Treatment: Gait belt  OT Visit Diagnosis: Unsteadiness on feet (R26.81);Other abnormalities of gait and mobility (R26.89);Muscle weakness (generalized) (M62.81);Pain Pain - Right/Left: Right Pain - part of body: Hip;Leg   Activity Tolerance Patient tolerated treatment well   Patient Left in chair;with call bell/phone within reach;with chair alarm set   Nurse Communication Mobility status        Time: 1610-9604 OT Time Calculation (min): 23 min  Charges: OT General Charges $OT Visit: 1 Visit OT Treatments $Therapeutic Activity: 8-22 mins  04/24/2023  AB, OTR/L  Acute Rehabilitation Services  Office: [phone]   Ethelene Browns  D Daison Braxton 04/24/2023, 4:58 PM

## 2023-04-24 NOTE — Plan of Care (Signed)
  Problem: Coping: Goal: Level of anxiety will decrease Outcome: Progressing   Problem: Nutrition: Goal: Adequate nutrition will be maintained Outcome: Progressing   Problem: Health Behavior/Discharge Planning: Goal: Ability to manage health-related needs will improve Outcome: Progressing

## 2023-04-24 NOTE — Progress Notes (Signed)
Physical Therapy Treatment Patient Details Name: Scott Pruitt MRN: [id number] DOB: 10-21-1960 Today's Date: 04/24/2023   History of Present Illness 62 y.o. male admitted to [facility] on 04/17/2023 with acute right hip fracture after ground-level mechanical fall after presenting from home to [facility] ED complaining of right hip pain. He is s/p nailing of R hip fx 04/18/23. Past medical history significant for essential hypertension, hyperlipidemia    PT Comments  Pt with fair tolerance to treatment today. Co treat with OT. Pt was able to take a few steps and transfer to the chair with +2 Max A. No change in DC/DME recs at this time. PT will continue to follow.   If plan is discharge home, recommend the following: Two people to help with walking and/or transfers;A lot of help with bathing/dressing/bathroom;Assistance with cooking/housework;Direct supervision/assist for medications management;Assist for transportation;Help with stairs or ramp for entrance   Can travel by private vehicle     No  Equipment Recommendations  Other (comment) (Per accepting facility)    Recommendations for Other Services       Precautions / Restrictions Precautions Precautions: Fall Restrictions Weight Bearing Restrictions: Yes RLE Weight Bearing: Weight bearing as tolerated     Mobility  Bed Mobility Overal bed mobility: Needs Assistance Bed Mobility: Supine to Sit   Sidelying to sit: Min guard Supine to sit: Max assist     General bed mobility comments: Max A for BLE management. Min G for sielying to sit from elevated HOB.    Transfers Overall transfer level: Needs assistance Equipment used: 2 person hand held assist Transfers: Sit to/from Stand, Bed to chair/wheelchair/BSC Sit to Stand: +2 physical assistance, Max assist   Step pivot transfers: +2 physical assistance, Max assist       General transfer comment: Pt able to take a few steps today with +2 Max A and Max verbal cues for  sequecing. Pt with continued slow processing and difficulty follow directions.    Ambulation/Gait               General Gait Details: pt unable   Stairs             Wheelchair Mobility     Tilt Bed    Modified Rankin (Stroke Patients Only)       Balance Overall balance assessment: Needs assistance Sitting-balance support: Feet supported, Single extremity supported Sitting balance-Leahy Scale: Poor Sitting balance - Comments: min guard and one extremity support seated on EOB Postural control: Posterior lean Standing balance support: Bilateral upper extremity supported Standing balance-Leahy Scale: Poor Standing balance comment: reliant on therapist                            Cognition Arousal/Alertness: Awake/alert Behavior During Therapy: Flat affect Overall Cognitive Status: No family/caregiver present to determine baseline cognitive functioning                                 General Comments: slow processing, alert and oriented        Exercises      General Comments General comments (skin integrity, edema, etc.): VSS      Pertinent Vitals/Pain Pain Assessment Pain Assessment: 0-10 Pain Score: 10-Worst pain ever Pain Location: R hip/leg Pain Descriptors / Indicators: Aching, Discomfort, Guarding, Moaning Pain Intervention(s): Monitored during session, Limited activity within patient's tolerance, Repositioned, Patient requesting pain meds-RN notified  Home Living                          Prior Function            PT Goals (current goals can now be found in the care plan section) Progress towards PT goals: Progressing toward goals    Frequency    Min 1X/week      PT Plan Current plan remains appropriate    Co-evaluation              AM-PAC PT "6 Clicks" Mobility   Outcome Measure  Help needed turning from your back to your side while in a flat bed without using bedrails?: A  Lot Help needed moving from lying on your back to sitting on the side of a flat bed without using bedrails?: A Lot Help needed moving to and from a bed to a chair (including a wheelchair)?: A Lot Help needed standing up from a chair using your arms (e.g., wheelchair or bedside chair)?: A Lot Help needed to walk in hospital room?: Total Help needed climbing 3-5 steps with a railing? : Total 6 Click Score: 10    End of Session Equipment Utilized During Treatment: Gait belt Activity Tolerance: Patient limited by pain Patient left: in chair;with call bell/phone within reach;with chair alarm set Nurse Communication: Mobility status;Patient requests pain meds PT Visit Diagnosis: Other abnormalities of gait and mobility (R26.89)     Time: 6578-4696 PT Time Calculation (min) (ACUTE ONLY): 23 min  Charges:    $Therapeutic Activity: 8-22 mins PT General Charges $$ ACUTE PT VISIT: 1 Visit                     Scott Pruitt, PT, DPT Acute Rehab Services [phone]    Scott Pruitt 04/24/2023, 4:27 PM

## 2023-04-24 NOTE — TOC Progression Note (Addendum)
Transition of Care [facility]) - Progression Note    Patient Details  Name: Scott Pruitt MRN: [id number] Date of Birth: [birth year]  Transition of Care [facility]) CM/SW Contact  Lorri Frederick, LCSW Phone Number: 04/24/2023, 8:48 AM  Clinical Narrative:   TC Amanda/[facility].  She did receive referral and it is being reviewed currently.  She will call once a decision is reached on potential bed offer.  1050: TC Deborah/[facility]: they are no longer in network with [employer].       Expected Discharge Plan and Services                                               Social Determinants of Health (SDOH) Interventions SDOH Screenings   Food Insecurity: Food Insecurity Present (04/18/2023)  Housing: Low Risk  (04/18/2023)  Transportation Needs: No Transportation Needs (04/18/2023)  Depression (PHQ2-9): Medium Risk (03/11/2020)  Social Connections: Unknown (02/02/2022)   Received from [facility]  Tobacco Use: Medium Risk (04/18/2023)    Readmission Risk Interventions     No data to display

## 2023-04-24 NOTE — Progress Notes (Signed)
  Progress Note   Patient: Scott Pruitt UXN:[id number] DOB: 01/25/1961 DOA: 04/17/2023     7 DOS: the patient was seen and examined on 04/24/2023 at 9:11AM      Brief hospital course: Mr. Melendres is a 62 y.o. M with HTN, HLD who presented after a fall, found to have right hip fracture.     Assessment and Plan: * Closed right hip fracture (HCC) S/p Cephalomedullary nail of right hip fracture by Dr. Susa Simmonds on 7/25 - Consult Orthopedics - WBAT - Aspirin at discharge    Hypokalemia Supplemented  Vitamin D deficiency - Supp vit D and calcium  Hyponatremia Mild asymptomatic - Continue hydrochlorothiazide for now  Transaminitis Denies EtOH.  Viral serologies negative.  Resolved   Fall   Hyperlipidemia Not on statin at present  Essential hypertension BP controlled - Continue amlodipine, lisinopril, hydrochlorothiazide           Subjective: No complaints, pain improving.  Working with PT.  No confusion, no nursing concerns.     Physical Exam: BP 128/82 (BP Location: Right Arm)   Pulse 85   Temp 98.3 F (36.8 C) (Oral)   Resp 18   Ht 5\' 9"  (1.753 m)   Wt 73 kg   SpO2 98%   BMI 23.77 kg/m   Thin adult male, lying in bed, interactive and appropriate RRR, no murmurs, no peripheral edema Respiratory rate normal, lungs clear without rales or wheezes Abdomen soft no tenderness palpation or guarding, no ascites or distention Attention normal, affect blunted and psychomotor slowing noted but this is about at his baseline, face symmetric, amblyopia, chronic, noted, upper extremities normal  Data Reviewed: No new  Family Communication: None present    Disposition: Status is: Inpatient Patient is status post hip fracture, he is medically ready for discharge when bed is available but lives alone and currently is not safe to discharge home.           Author: Alberteen Sam, MD 04/24/2023 5:04 PM  For on call review [email or web address].

## 2023-04-25 DIAGNOSIS — I10 Essential (primary) hypertension: Secondary | ICD-10-CM | POA: Diagnosis not present

## 2023-04-25 DIAGNOSIS — S72001A Fracture of unspecified part of neck of right femur, initial encounter for closed fracture: Secondary | ICD-10-CM | POA: Diagnosis not present

## 2023-04-25 MED ORDER — CALCIUM CARBONATE 1250 (500 CA) MG PO TABS: 1.0000 | ORAL_TABLET | Freq: Every day | ORAL | Status: AC

## 2023-04-25 NOTE — Progress Notes (Signed)
[facility] PROGRESS NOTE   Scott Pruitt ZOX:[id number] DOB: 23-May-1961 DOA: 04/17/2023  PCP: Pcp, No  Brief History/Interval Summary: Mr. Scott Pruitt is a 62 y.o. M with HTN, HLD who presented after a fall, found to have right hip fracture.     Subjective/Interval History: Patient denies any complaints.  Pain is reasonably well-controlled.  No nausea vomiting.    Assessment/Plan:  Closed right hip fracture S/p Cephalomedullary nail of right hip fracture by Dr. Susa Simmonds on 7/25 PT and OT evaluation.  Skilled nursing facility for rehab was recommended.  Social worker is following.  Aspirin for DVT prophylaxis.    Hypokalemia Supplemented.  Recheck labs tomorrow.   Vitamin D deficiency Supp vit D and calcium   Hyponatremia Mild asymptomatic HCTZ was discontinued.   Transaminitis Denies EtOH.  Viral serologies negative.  Resolved   Hyperlipidemia Not on statin at present   Essential hypertension BP controlled. Continue amlodipine, lisinopril. Hydrochlorothiazide was discontinued.   Sacral decubitus stage II Pressure Injury 04/22/23 Sacrum Mid Stage 2 -  Partial thickness loss of dermis presenting as a shallow open injury with a red, pink wound bed without slough. blister (Active)  04/22/23 0230  Location: Sacrum  Location Orientation: Mid  Staging: Stage 2 -  Partial thickness loss of dermis presenting as a shallow open injury with a red, pink wound bed without slough.  Wound Description (Comments): blister  Present on Admission: No    DVT Prophylaxis: Lovenox Code Status: Full code Family Communication: No family at bedside Disposition Plan: Waiting on SNF     Medications: Scheduled:  amLODipine  5 mg Oral Daily   calcium carbonate  1 tablet Oral Q breakfast   cholecalciferol  1,000 Units Oral Daily   docusate sodium  100 mg Oral BID   enoxaparin (LOVENOX) injection  40 mg Subcutaneous Q24H   lisinopril  20 mg Oral Daily    Continuous: [id number] **OR** acetaminophen, fentaNYL (SUBLIMAZE) injection, labetalol, melatonin, naLOXone (NARCAN)  injection, ondansetron (ZOFRAN) IV, oxyCODONE  Antibiotics: Anti-infectives (From admission, onward)    Start     Dose/Rate Route Frequency Ordered Stop   04/18/23 1800  ceFAZolin (ANCEF) IVPB 1 g/50 mL premix        1 g 100 mL/hr over 30 Minutes Intravenous Every 6 hours 04/18/23 1512 04/19/23 0715       Objective:  Vital Signs  Vitals:   04/24/23 1332 04/24/23 2033 04/25/23 0420 04/25/23 0757  BP: 128/82 114/78 113/75 113/67  Pulse: 85 86 78 88  Resp: 18 17 18 17   Temp: 98.3 F (36.8 C) 98 F (36.7 C)  97.6 F (36.4 C)  TempSrc: Oral   Oral  SpO2: 98% 96% 97% 97%  Weight:      Height:        Intake/Output Summary (Last 24 hours) at 04/25/2023 1128 Last data filed at 04/25/2023 0500 Gross per 24 hour  Intake 120 ml  Output 725 ml  Net -605 ml   Filed Weights   04/22/23 0611 04/23/23 0600 04/24/23 0432  Weight: 72.6 kg 72.3 kg 73 kg    General appearance: Awake alert.  In no distress Resp: Clear to auscultation bilaterally.  Normal effort Cardio: S1-S2 is normal regular.  No S3-S4.  No rubs murmurs or bruit GI: Abdomen is soft.  Nontender nondistended.  Bowel sounds are present normal.  No masses organomegaly   Lab Results:  Data Reviewed: I have personally reviewed following labs and reports of the imaging studies  CBC: Recent  Labs  Lab 04/19/23 0256 04/20/23 0243 04/21/23 0314 04/23/23 0243  WBC 9.5 11.0* 8.9 9.6  HGB 11.2* 11.8* 10.3* 11.2*  HCT 33.9* 36.0* 30.4* 33.6*  MCV 89.2 91.1 86.6 88.4  PLT 156 198 186 250    Basic Metabolic Panel: Recent Labs  Lab 04/19/23 0256 04/20/23 0243 04/21/23 0314 04/22/23 0212 04/23/23 0243 04/25/23 0242  NA 132* 133* 128* 130* 132*  --   K 4.3 3.2* 3.6  --  3.9  --   CL 95* 92* 91*  --  93*  --   CO2 29 29 29   --  30  --   GLUCOSE 118* 100* 100*  --  111*  --   BUN 30* 27*  18  --  21  --   CREATININE 1.08 0.98 0.82  --  0.78 0.92  CALCIUM 8.8* 8.7* 8.4*  --  8.6*  --     GFR: Estimated Creatinine Clearance: 82.2 mL/min (by C-G formula based on SCr of 0.92 mg/dL).  Liver Function Tests: Recent Labs  Lab 04/21/23 0314  AST 37  ALT 36  ALKPHOS 59  BILITOT 0.7  PROT 5.6*  ALBUMIN 2.5*     Cardiac Enzymes: Recent Labs  Lab 04/19/23 0256  CKTOTAL 294     Recent Results (from the past 240 hour(s))  Surgical pcr screen     Status: None   Collection Time: 04/18/23  7:26 AM   Specimen: Nasal Mucosa; Nasal Swab  Result Value Ref Range Status   MRSA, PCR NEGATIVE NEGATIVE Final   Staphylococcus aureus NEGATIVE NEGATIVE Final    Comment: (NOTE) The Xpert SA Assay (FDA approved for NASAL specimens in patients 42 years of age and older), is one component of a comprehensive surveillance program. It is not intended to diagnose infection nor to guide or monitor treatment. Performed at [facility], [address], [place], [place] [address]       Radiology Studies: No results found.     LOS: 8 days   [place] Dani [employer] on [email or web address]  04/25/2023, 11:28 AM

## 2023-04-25 NOTE — Plan of Care (Signed)

## 2023-04-25 NOTE — Progress Notes (Signed)
Mobility Specialist Progress Note   04/25/23 1450  Mobility  Activity Moved into chair position in bed (Bed level exercise)  Level of Assistance Minimal assist, patient does 75% or more  Assistive Device None  Range of Motion/Exercises Active;All extremities  Activity Response Tolerated well   Patient received in supine and agreeable to participate. Deferred OOB activity but requested to do LE exercises. Presented with less confusion, A&O and answered all questions appropriately. Completed bed level exercises requiring min A to initiate all movement in RLE. Tolerated without complaint or incident. Was left in supine with all needs met, call bell in reach.   [place] Benjamin Casanas, BS EXP Mobility Specialist Please contact via SecureChat or Rehab office at [phone]

## 2023-04-25 NOTE — TOC Progression Note (Addendum)
Transition of Care [facility]) - Progression Note    Patient Details  Name: Scott Pruitt MRN: [id number] Date of Birth: 10-16-1960  Transition of Care [facility]) CM/SW Contact  Lorri Frederick, LCSW Phone Number: 04/25/2023, 8:52 AM  Clinical Narrative:   CSW LM with [facility] regarding potential bed offer.   1350: TC [facility] [place].  Per her  business office, they have had trouble getting reimbursed by [employer] in the past, cannot offer bed.  1500: CSW spoke with pt, updated him on the above.  He would like SNF in the closest possible facility--CSW shared list of facilities from [employer] webesite, [facility] would be first choice.  CSW LM with Joesph July, asked her to call for update.  CSW contacted the following facilities that reportedly accept ambetter insurance:    [facility] [place], [phone].  LM  [facility], spoke to [place]: [phone], referral faxed to [phone]  [facility], [phone], Becky in admissions.  Referral faxed to [phone]  [facility], [phone].  Was directed to Rozell Searing, cell: [phone].  LM  [facility], [phone], LM        Expected Discharge Plan and Services                                               Social Determinants of Health (SDOH) Interventions SDOH Screenings   Food Insecurity: Food Insecurity Present (04/18/2023)  Housing: Low Risk  (04/18/2023)  Transportation Needs: No Transportation Needs (04/18/2023)  Depression (PHQ2-9): Medium Risk (03/11/2020)  Social Connections: Unknown (02/02/2022)   Received from [facility]  Tobacco Use: Medium Risk (04/18/2023)    Readmission Risk Interventions     No data to display

## 2023-04-25 NOTE — Plan of Care (Signed)
  Problem: Education: Goal: Knowledge of General Education information will improve Description: Including pain rating scale, medication(s)/side effects and non-pharmacologic comfort measures Outcome: Progressing   Problem: Clinical Measurements: Goal: Respiratory complications will improve Outcome: Progressing   Problem: Nutrition: Goal: Adequate nutrition will be maintained Outcome: Progressing   Problem: Elimination: Goal: Will not experience complications related to urinary retention Outcome: Progressing   Problem: Safety: Goal: Ability to remain free from injury will improve Outcome: Progressing

## 2023-04-26 DIAGNOSIS — I10 Essential (primary) hypertension: Secondary | ICD-10-CM | POA: Diagnosis not present

## 2023-04-26 DIAGNOSIS — S72001A Fracture of unspecified part of neck of right femur, initial encounter for closed fracture: Secondary | ICD-10-CM | POA: Diagnosis not present

## 2023-04-26 NOTE — Plan of Care (Signed)

## 2023-04-26 NOTE — TOC Progression Note (Addendum)
Transition of Care [facility]) - Progression Note    Patient Details  Name: Scott Pruitt MRN: [id number] Date of Birth: 03/30/1961  Transition of Care [facility]) CM/SW Contact  Lorri Frederick, LCSW Phone Number: 04/26/2023, 3:12 PM  Clinical Narrative:    CSW contacted the following facilities that reportedly accept ambetter insurance:      [facility], [phone].  Left a second message.    [facility], Left message with Asher Muir to confirm referral was received and see about bed offer.   [facility] [place], spoke to [employer], cell: [phone].  She anticipates DC Monday.  She is gone for the day, will confirm Monday that she can offer bed and start auth.     [facility], [phone].  Spoke to [place], cell: [phone], referral emailed to [email or web address].[facility], [phone], Left second message.    CSW updated Joesph July on SNF efforts.  She is asking that pt be tested for dementia while he is in the hospital.          Expected Discharge Plan and Services                                               Social Determinants of Health (SDOH) Interventions SDOH Screenings   Food Insecurity: Food Insecurity Present (04/18/2023)  Housing: Low Risk  (04/18/2023)  Transportation Needs: No Transportation Needs (04/18/2023)  Depression (PHQ2-9): Medium Risk (03/11/2020)  Social Connections: Unknown (02/02/2022)   Received from [facility]  Tobacco Use: Medium Risk (04/18/2023)    Readmission Risk Interventions     No data to display

## 2023-04-26 NOTE — Progress Notes (Signed)
Physical Therapy Treatment Patient Details Name: Scott Pruitt MRN: [id number] DOB: [birth year] Today's Date: 04/26/2023   History of Present Illness 63 y.o. male admitted to [facility] on 04/17/2023 with acute right hip fracture after ground-level mechanical fall after presenting from home to [facility] ED complaining of right hip pain. He is s/p nailing of R hip fx 04/18/23. Past medical history significant for essential hypertension, hyperlipidemia    PT Comments  Pt with poor tolerance to treatment today. Pt with zero initiation of movement repeated stating "I can't" when cued for mobility and required total A for all mobility. Pt appears to have hit a plateau with mobility status. No change in DC/DME recs at this time. PT will continue to follow.   If plan is discharge home, recommend the following: Two people to help with walking and/or transfers;A lot of help with bathing/dressing/bathroom;Assistance with cooking/housework;Direct supervision/assist for medications management;Assist for transportation;Help with stairs or ramp for entrance   Can travel by private vehicle     No  Equipment Recommendations  Other (comment) (Per accepting facility)    Recommendations for Other Services       Precautions / Restrictions Precautions Precautions: Fall Restrictions Weight Bearing Restrictions: Yes RLE Weight Bearing: Weight bearing as tolerated     Mobility  Bed Mobility Overal bed mobility: Needs Assistance Bed Mobility: Supine to Sit     Supine to sit: Total assist     General bed mobility comments: Pt with zero initiation of mobility today.    Transfers Overall transfer level: Needs assistance Equipment used: 1 person hand held assist Transfers: Sit to/from Stand, Bed to chair/wheelchair/BSC Sit to Stand: Total assist Stand pivot transfers: Total assist         General transfer comment: Pt with zero initiation of movement today. When cued to move pt repeatedly says "I  cant".    Ambulation/Gait                   Stairs             Wheelchair Mobility     Tilt Bed    Modified Rankin (Stroke Patients Only)       Balance Overall balance assessment: Needs assistance Sitting-balance support: Feet supported, Single extremity supported Sitting balance-Leahy Scale: Poor Sitting balance - Comments: Heavy L lateral lean today Postural control: Left lateral lean Standing balance support: Bilateral upper extremity supported Standing balance-Leahy Scale: Poor Standing balance comment: reliant on therapist                            Cognition Arousal/Alertness: Awake/alert Behavior During Therapy: Flat affect Overall Cognitive Status: No family/caregiver present to determine baseline cognitive functioning                                 General Comments: slow processing, alert and oriented, continuous screaming to get attention of staff but pt informed on the use of call bell (which is positioned next to him)        Exercises      General Comments General comments (skin integrity, edema, etc.): VSS      Pertinent Vitals/Pain Pain Assessment Pain Assessment: Faces Faces Pain Scale: Hurts little more Pain Location: R hip/leg Pain Descriptors / Indicators: Aching, Discomfort, Guarding, Moaning Pain Intervention(s): Monitored during session, Limited activity within patient's tolerance, Repositioned    Home Living  Prior Function            PT Goals (current goals can now be found in the care plan section) Progress towards PT goals: Not progressing toward goals - comment (Pt stagnant with mobility)    Frequency    Min 1X/week      PT Plan Current plan remains appropriate    Co-evaluation              AM-PAC PT "6 Clicks" Mobility   Outcome Measure  Help needed turning from your back to your side while in a flat bed without using bedrails?:  Total Help needed moving from lying on your back to sitting on the side of a flat bed without using bedrails?: Total Help needed moving to and from a bed to a chair (including a wheelchair)?: Total Help needed standing up from a chair using your arms (e.g., wheelchair or bedside chair)?: Total Help needed to walk in hospital room?: Total Help needed climbing 3-5 steps with a railing? : Total 6 Click Score: 6    End of Session Equipment Utilized During Treatment: Gait belt Activity Tolerance: Patient limited by pain Patient left: in chair;with call bell/phone within reach;with chair alarm set Nurse Communication: Mobility status;Need for lift equipment PT Visit Diagnosis: Other abnormalities of gait and mobility (R26.89)     Time: 8119-1478 PT Time Calculation (min) (ACUTE ONLY): 12 min  Charges:    $Therapeutic Activity: 8-22 mins PT General Charges $$ ACUTE PT VISIT: 1 Visit                     Shela Nevin, PT, DPT Acute Rehab Services [phone]    [name] 04/26/2023, 11:56 AM

## 2023-04-26 NOTE — Plan of Care (Signed)

## 2023-04-26 NOTE — Progress Notes (Signed)
[facility] PROGRESS NOTE   Scott Pruitt JYN:[id number] DOB: 10-Sep-1961 DOA: 04/17/2023  PCP: Pcp, No  Brief History/Interval Summary: Scott Pruitt is a 62 y.o. M with HTN, HLD who presented after a fall, found to have right hip fracture.     Subjective/Interval History: Patient feels well.  Denies any complaints.  Pain in the right hip area is reasonably well-controlled.    Assessment/Plan:  Closed right hip fracture S/p Cephalomedullary nail of right hip fracture by Dr. Susa Simmonds on 7/25 PT and OT evaluation.  Skilled nursing facility for rehab was recommended.  Social worker is following.   Aspirin for DVT prophylaxis.    Hypokalemia Resolved.  Labs are stable this morning.   Vitamin D deficiency Supp vit D and calcium   Hyponatremia Mild asymptomatic HCTZ was discontinued. Sodium level is stable.   Transaminitis Denies EtOH.  Viral serologies negative.  Resolved   Hyperlipidemia Not on statin at present   Essential hypertension BP controlled. Continue amlodipine, lisinopril.  Hydrochlorothiazide was discontinued due to hyponatremia.   Sacral decubitus stage II Pressure Injury 04/22/23 Sacrum Mid Stage 2 -  Partial thickness loss of dermis presenting as a shallow open injury with a red, pink wound bed without slough. blister (Active)  04/22/23 0230  Location: Sacrum  Location Orientation: Mid  Staging: Stage 2 -  Partial thickness loss of dermis presenting as a shallow open injury with a red, pink wound bed without slough.  Wound Description (Comments): blister  Present on Admission: No    DVT Prophylaxis: Lovenox Code Status: Full code Family Communication: No family at bedside Disposition Plan: Waiting on SNF     Medications: Scheduled:  amLODipine  5 mg Oral Daily   calcium carbonate  1 tablet Oral Q breakfast   cholecalciferol  1,000 Units Oral Daily   docusate sodium  100 mg Oral BID   enoxaparin (LOVENOX) injection  40 mg Subcutaneous Q24H    lisinopril  20 mg Oral Daily   Continuous: QMV:[id number] **OR** acetaminophen, fentaNYL (SUBLIMAZE) injection, labetalol, melatonin, naLOXone (NARCAN)  injection, ondansetron (ZOFRAN) IV, oxyCODONE  Antibiotics: Anti-infectives (From admission, onward)    Start     Dose/Rate Route Frequency Ordered Stop   04/18/23 1800  ceFAZolin (ANCEF) IVPB 1 g/50 mL premix        1 g 100 mL/hr over 30 Minutes Intravenous Every 6 hours 04/18/23 1512 04/19/23 0715       Objective:  Vital Signs  Vitals:   04/25/23 2022 04/26/23 0444 04/26/23 0500 04/26/23 0801  BP: 124/76 123/89  115/79  Pulse: 89 80  82  Resp: 18 18  17   Temp: 98.9 F (37.2 C) 98.5 F (36.9 C)  98.6 F (37 C)  TempSrc: Oral Oral  Oral  SpO2: 99% 92%  98%  Weight:   71.7 kg   Height:        Intake/Output Summary (Last 24 hours) at 04/26/2023 0953 Last data filed at 04/26/2023 0751 Gross per 24 hour  Intake 360 ml  Output 1252 ml  Net -892 ml   Filed Weights   04/23/23 0600 04/24/23 0432 04/26/23 0500  Weight: 72.3 kg 73 kg 71.7 kg    General appearance: Awake alert.  In no distress Resp: Clear to auscultation bilaterally.  Normal effort Cardio: S1-S2 is normal regular.  No S3-S4.  No rubs murmurs or bruit GI: Abdomen is soft.  Nontender nondistended.  Bowel sounds are present normal.  No masses organomegaly Extremities: No bruising noted over the  right thigh area   Lab Results:  Data Reviewed: I have personally reviewed following labs and reports of the imaging studies  CBC: Recent Labs  Lab 04/20/23 0243 04/21/23 0314 04/23/23 0243 04/26/23 0515  WBC 11.0* 8.9 9.6 10.0  HGB 11.8* 10.3* 11.2* 10.8*  HCT 36.0* 30.4* 33.6* 33.1*  MCV 91.1 86.6 88.4 88.5  PLT 198 186 250 335    Basic Metabolic Panel: Recent Labs  Lab 04/20/23 0243 04/21/23 0314 04/22/23 0212 04/23/23 0243 04/25/23 0242 04/26/23 0515  NA 133* 128* 130* 132*  --  133*  K 3.2* 3.6  --  3.9  --  4.3  CL 92* 91*  --  93*   --  93*  CO2 29 29  --  30  --  30  GLUCOSE 100* 100*  --  111*  --  99  BUN 27* 18  --  21  --  18  CREATININE 0.98 0.82  --  0.78 0.92 0.77  CALCIUM 8.7* 8.4*  --  8.6*  --  8.7*    GFR: Estimated Creatinine Clearance: 94.5 mL/min (by C-G formula based on SCr of 0.77 mg/dL).  Liver Function Tests: Recent Labs  Lab 04/21/23 0314  AST 37  ALT 36  ALKPHOS 59  BILITOT 0.7  PROT 5.6*  ALBUMIN 2.5*      Recent Results (from the past 240 hour(s))  Surgical pcr screen     Status: None   Collection Time: 04/18/23  7:26 AM   Specimen: Nasal Mucosa; Nasal Swab  Result Value Ref Range Status   MRSA, PCR NEGATIVE NEGATIVE Final   Staphylococcus aureus NEGATIVE NEGATIVE Final    Comment: (NOTE) The Xpert SA Assay (FDA approved for NASAL specimens in patients 107 years of age and older), is one component of a comprehensive surveillance program. It is not intended to diagnose infection nor to guide or monitor treatment. Performed at [facility], [address] [place], [place] [address]       Radiology Studies: No results found.     LOS: 9 days    [employer] on [email or web address]  04/26/2023, 9:53 AM

## 2023-04-27 ENCOUNTER — Inpatient Hospital Stay (HOSPITAL_COMMUNITY): Payer: No Typology Code available for payment source

## 2023-04-27 DIAGNOSIS — I10 Essential (primary) hypertension: Secondary | ICD-10-CM | POA: Diagnosis not present

## 2023-04-27 DIAGNOSIS — S72001A Fracture of unspecified part of neck of right femur, initial encounter for closed fracture: Secondary | ICD-10-CM | POA: Diagnosis not present

## 2023-04-27 MED ORDER — STROKE: EARLY STAGES OF RECOVERY BOOK
Freq: Once | Status: AC
  Filled 2023-04-27: qty 1

## 2023-04-27 MED ORDER — ASPIRIN 325 MG PO TABS
325.0000 mg | ORAL_TABLET | Freq: Every day | ORAL | Status: DC
  Administered 2023-04-27 – 2023-04-28 (×2): 325 mg via ORAL
  Filled 2023-04-27 (×2): qty 1

## 2023-04-27 MED ORDER — IOHEXOL 350 MG/ML SOLN
75.0000 mL | Freq: Once | INTRAVENOUS | Status: AC | PRN
  Administered 2023-04-27: 75 mL via INTRAVENOUS

## 2023-04-27 MED ORDER — ADULT MULTIVITAMIN W/MINERALS CH
1.0000 | ORAL_TABLET | Freq: Every day | ORAL | Status: DC
  Administered 2023-04-27 – 2023-05-11 (×15): 1 via ORAL
  Filled 2023-04-27 (×15): qty 1

## 2023-04-27 MED ORDER — THIAMINE MONONITRATE 100 MG PO TABS
100.0000 mg | ORAL_TABLET | Freq: Every day | ORAL | Status: DC
  Administered 2023-04-27: 100 mg via ORAL
  Filled 2023-04-27: qty 1

## 2023-04-27 MED ORDER — ASPIRIN 300 MG RE SUPP
300.0000 mg | Freq: Every day | RECTAL | Status: DC
  Filled 2023-04-27: qty 1

## 2023-04-27 NOTE — Plan of Care (Signed)
  Problem: Education: Goal: Knowledge of General Education information will improve Description: Including pain rating scale, medication(s)/side effects and non-pharmacologic comfort measures 04/27/2023 2227 by Beryle Flock, RN Outcome: Progressing 04/27/2023 2227 by Beryle Flock, RN Outcome: Progressing

## 2023-04-27 NOTE — Progress Notes (Addendum)
[facility] PROGRESS NOTE   Scott Pruitt MWU:[id number] DOB: 1961-08-17 DOA: 04/17/2023  PCP: Pcp, No  Brief History/Interval Summary: Mr. Lurz is a 62 y.o. M with HTN, HLD who presented after a fall, found to have right hip fracture.     Subjective/Interval History: Patient denies any complaints.  Pain is reasonably well-controlled.  No nausea vomiting.     Assessment/Plan:  Closed right hip fracture S/p Cephalomedullary nail of right hip fracture by Dr. Susa Simmonds on 7/25 PT and OT evaluation.  Skilled nursing facility for rehab was recommended.  Social worker is following.   Aspirin for DVT prophylaxis.   Concern for cognitive impairment/Acute CVA Patient's significant other has noted some cognitive impairment over the past several months.  Patient was able to tell me he was in the hospital but could not tell me the year or the month accurately.  He does not have any focal neurological deficits.  It is reasonable to do partial cognitive workup in the hospital since he will be here for the next 2 days.  Will proceed with MRI brain TSH RPR B12 folic acid levels. Will need to pursue further workup in the outpatient setting. Initiate multivitamins and thiamine.  ADDENDUM MRI shows acute stroke.  Discussed with neurology, they will consult.  Concern for embolic etiology.  Echocardiogram ordered.  Placed on aspirin.  Lipid panel ordered HbA1c ordered.  SLP evaluation.  Stroke order set is initiated.  Telemetry. Vascular studies per neuro.   Hypokalemia Resolved.   Normocytic anemia Mild drop in hemoglobin likely due to surgery and fracture.  No overt blood loss noted.  Check anemia panel.   Vitamin D deficiency Supp vit D and calcium   Hyponatremia Mild asymptomatic HCTZ was discontinued. Sodium level is stable.   Transaminitis Denies EtOH.  Viral serologies negative.  Resolved   Hyperlipidemia Not on statin at present   Essential hypertension BP controlled.  Continue amlodipine, lisinopril.  Hydrochlorothiazide was discontinued due to hyponatremia.   Sacral decubitus stage II Pressure Injury 04/22/23 Sacrum Mid Stage 2 -  Partial thickness loss of dermis presenting as a shallow open injury with a red, pink wound bed without slough. blister (Active)  04/22/23 0230  Location: Sacrum  Location Orientation: Mid  Staging: Stage 2 -  Partial thickness loss of dermis presenting as a shallow open injury with a red, pink wound bed without slough.  Wound Description (Comments): blister  Present on Admission: No    DVT Prophylaxis: Lovenox Code Status: Full code Family Communication: No family at bedside Disposition Plan: Waiting on SNF     Medications: Scheduled:  amLODipine  5 mg Oral Daily   calcium carbonate  1 tablet Oral Q breakfast   cholecalciferol  1,000 Units Oral Daily   docusate sodium  100 mg Oral BID   enoxaparin (LOVENOX) injection  40 mg Subcutaneous Q24H   lisinopril  20 mg Oral Daily   Continuous: VOZ:[id number] **OR** acetaminophen, fentaNYL (SUBLIMAZE) injection, labetalol, melatonin, naLOXone (NARCAN)  injection, ondansetron (ZOFRAN) IV, oxyCODONE  Antibiotics: Anti-infectives (From admission, onward)    Start     Dose/Rate Route Frequency Ordered Stop   04/18/23 1800  ceFAZolin (ANCEF) IVPB 1 g/50 mL premix        1 g 100 mL/hr over 30 Minutes Intravenous Every 6 hours 04/18/23 1512 04/19/23 0715       Objective:  Vital Signs  Vitals:   04/26/23 1406 04/26/23 1942 04/27/23 0429 04/27/23 0751  BP: 116/76 124/76 121/67 120/88  Pulse: 82 93 83 78  Resp: 17 18 18 17   Temp: 98.2 F (36.8 C) 99.2 F (37.3 C) 98.4 F (36.9 C) 98 F (36.7 C)  TempSrc: Oral Oral Oral Oral  SpO2: 96% 96% 95% 98%  Weight:      Height:       No intake or output data in the 24 hours ending 04/27/23 1031  Filed Weights   04/23/23 0600 04/24/23 0432 04/26/23 0500  Weight: 72.3 kg 73 kg 71.7 kg    General appearance:  Awake alert.  In no distress Resp: Clear to auscultation bilaterally.  Normal effort Cardio: S1-S2 is normal regular.  No S3-S4.  No rubs murmurs or bruit GI: Abdomen is soft.  Nontender nondistended.  Bowel sounds are present normal.  No masses organomegaly No obvious focal neurological deficits noted.  He is oriented to place.  Lab Results:  Data Reviewed: I have personally reviewed following labs and reports of the imaging studies  CBC: Recent Labs  Lab 04/21/23 0314 04/23/23 0243 04/26/23 0515  WBC 8.9 9.6 10.0  HGB 10.3* 11.2* 10.8*  HCT 30.4* 33.6* 33.1*  MCV 86.6 88.4 88.5  PLT 186 250 335    Basic Metabolic Panel: Recent Labs  Lab 04/21/23 0314 04/22/23 0212 04/23/23 0243 04/25/23 0242 04/26/23 0515  NA 128* 130* 132*  --  133*  K 3.6  --  3.9  --  4.3  CL 91*  --  93*  --  93*  CO2 29  --  30  --  30  GLUCOSE 100*  --  111*  --  99  BUN 18  --  21  --  18  CREATININE 0.82  --  0.78 0.92 0.77  CALCIUM 8.4*  --  8.6*  --  8.7*    GFR: Estimated Creatinine Clearance: 94.5 mL/min (by C-G formula based on SCr of 0.77 mg/dL).  Liver Function Tests: Recent Labs  Lab 04/21/23 0314  AST 37  ALT 36  ALKPHOS 59  BILITOT 0.7  PROT 5.6*  ALBUMIN 2.5*      Recent Results (from the past 240 hour(s))  Surgical pcr screen     Status: None   Collection Time: 04/18/23  7:26 AM   Specimen: Nasal Mucosa; Nasal Swab  Result Value Ref Range Status   MRSA, PCR NEGATIVE NEGATIVE Final   Staphylococcus aureus NEGATIVE NEGATIVE Final    Comment: (NOTE) The Xpert SA Assay (FDA approved for NASAL specimens in patients 26 years of age and older), is one component of a comprehensive surveillance program. It is not intended to diagnose infection nor to guide or monitor treatment. Performed at [facility], [address] [place], [place] [address]       Radiology Studies: No results found.     LOS: 10 days    [employer] on [email or web address]  04/27/2023, 10:31 AM

## 2023-04-27 NOTE — Plan of Care (Signed)
  Problem: Education: Goal: Knowledge of General Education information will improve Description Including pain rating scale, medication(s)/side effects and non-pharmacologic comfort measures Outcome: Progressing   

## 2023-04-28 ENCOUNTER — Inpatient Hospital Stay (HOSPITAL_COMMUNITY): Payer: No Typology Code available for payment source

## 2023-04-28 DIAGNOSIS — I639 Cerebral infarction, unspecified: Secondary | ICD-10-CM

## 2023-04-28 DIAGNOSIS — I6381 Other cerebral infarction due to occlusion or stenosis of small artery: Secondary | ICD-10-CM | POA: Diagnosis not present

## 2023-04-28 DIAGNOSIS — I6389 Other cerebral infarction: Secondary | ICD-10-CM

## 2023-04-28 DIAGNOSIS — R4189 Other symptoms and signs involving cognitive functions and awareness: Secondary | ICD-10-CM | POA: Diagnosis not present

## 2023-04-28 DIAGNOSIS — I82411 Acute embolism and thrombosis of right femoral vein: Secondary | ICD-10-CM

## 2023-04-28 DIAGNOSIS — S72001D Fracture of unspecified part of neck of right femur, subsequent encounter for closed fracture with routine healing: Secondary | ICD-10-CM

## 2023-04-28 LAB — IRON AND TIBC
Iron: 13 ug/dL — ABNORMAL LOW (ref 45–182)
Saturation Ratios: 6 % — ABNORMAL LOW (ref 17.9–39.5)
TIBC: 227 ug/dL — ABNORMAL LOW (ref 250–450)
UIBC: 214 ug/dL

## 2023-04-28 LAB — HIV ANTIBODY (ROUTINE TESTING W REFLEX): HIV Screen 4th Generation wRfx: NONREACTIVE

## 2023-04-28 LAB — RETICULOCYTES
Immature Retic Fract: 14.3 % (ref 2.3–15.9)
RBC.: 3.49 MIL/uL — ABNORMAL LOW (ref 4.22–5.81)
Retic Count, Absolute: 68.8 10*3/uL (ref 19.0–186.0)
Retic Ct Pct: 2 % (ref 0.4–3.1)

## 2023-04-28 LAB — ECHOCARDIOGRAM COMPLETE
AR max vel: 3.57 cm2
AV Area VTI: 3.4 cm2
AV Area mean vel: 3.44 cm2
AV Mean grad: 2 mmHg
AV Peak grad: 4.2 mmHg
Ao pk vel: 1.02 m/s
Area-P 1/2: 4.06 cm2
Height: 69 in
S' Lateral: 3.1 cm
Weight: 2529.12 [oz_av]

## 2023-04-28 LAB — HEMOGLOBIN A1C
Hgb A1c MFr Bld: 5.4 % (ref 4.8–5.6)
Mean Plasma Glucose: 108.28 mg/dL

## 2023-04-28 LAB — LIPID PANEL
Cholesterol: 105 mg/dL (ref 0–200)
HDL: 33 mg/dL — ABNORMAL LOW (ref >40)
LDL Cholesterol: 61 mg/dL (ref 0–99)
Total CHOL/HDL Ratio: 3.2 ratio
Triglycerides: 55 mg/dL (ref <150)
VLDL: 11 mg/dL (ref 0–40)

## 2023-04-28 LAB — BASIC METABOLIC PANEL WITH GFR
Anion gap: 9 (ref 5–15)
BUN: 22 mg/dL (ref 8–23)
CO2: 25 mmol/L (ref 22–32)
Calcium: 8.5 mg/dL — ABNORMAL LOW (ref 8.9–10.3)
Chloride: 95 mmol/L — ABNORMAL LOW (ref 98–111)
Creatinine, Ser: 0.86 mg/dL (ref 0.61–1.24)
GFR, Estimated: >60 mL/min (ref >60)
Glucose, Bld: 94 mg/dL (ref 70–99)
Potassium: 4 mmol/L (ref 3.5–5.1)
Sodium: 129 mmol/L — ABNORMAL LOW (ref 135–145)

## 2023-04-28 LAB — CBC
HCT: 29.6 % — ABNORMAL LOW (ref 39.0–52.0)
Hemoglobin: 9.8 g/dL — ABNORMAL LOW (ref 13.0–17.0)
MCH: 29.6 pg (ref 26.0–34.0)
MCHC: 33.1 g/dL (ref 30.0–36.0)
MCV: 89.4 fL (ref 80.0–100.0)
Platelets: 339 10*3/uL (ref 150–400)
RBC: 3.31 MIL/uL — ABNORMAL LOW (ref 4.22–5.81)
RDW: 13.1 % (ref 11.5–15.5)
WBC: 12.3 10*3/uL — ABNORMAL HIGH (ref 4.0–10.5)
nRBC: 0 % (ref 0.0–0.2)

## 2023-04-28 LAB — TSH: TSH: 1.011 u[IU]/mL (ref 0.350–4.500)

## 2023-04-28 LAB — RPR: RPR Ser Ql: NONREACTIVE

## 2023-04-28 LAB — RAPID URINE DRUG SCREEN, HOSP PERFORMED
Amphetamines: NOT DETECTED
Barbiturates: NOT DETECTED
Benzodiazepines: NOT DETECTED
Cocaine: NOT DETECTED
Opiates: NOT DETECTED
Tetrahydrocannabinol: POSITIVE — AB

## 2023-04-28 LAB — FERRITIN: Ferritin: 317 ng/mL (ref 24–336)

## 2023-04-28 LAB — VITAMIN B12: Vitamin B-12: 165 pg/mL — ABNORMAL LOW (ref 180–914)

## 2023-04-28 LAB — HEPARIN LEVEL (UNFRACTIONATED): Heparin Unfractionated: 0.14 IU/mL — ABNORMAL LOW (ref 0.30–0.70)

## 2023-04-28 LAB — FOLATE: Folate: 6.4 ng/mL (ref >5.9)

## 2023-04-28 MED ORDER — VITAMIN B-12 1000 MCG PO TABS
1000.0000 ug | ORAL_TABLET | Freq: Every day | ORAL | Status: DC
  Administered 2023-04-29 – 2023-05-11 (×13): 1000 ug via ORAL
  Filled 2023-04-28 (×13): qty 1

## 2023-04-28 MED ORDER — THIAMINE HCL 100 MG/ML IJ SOLN
250.0000 mg | Freq: Every day | INTRAVENOUS | Status: AC
  Administered 2023-04-30 – 2023-05-05 (×6): 250 mg via INTRAVENOUS
  Filled 2023-04-28 (×7): qty 2.5

## 2023-04-28 MED ORDER — CYANOCOBALAMIN 1000 MCG/ML IJ SOLN
1000.0000 ug | Freq: Once | INTRAMUSCULAR | Status: AC
  Administered 2023-04-28: 1000 ug via INTRAMUSCULAR
  Filled 2023-04-28: qty 1

## 2023-04-28 MED ORDER — SODIUM CHLORIDE 0.9 % IV SOLN: INTRAVENOUS | Status: DC

## 2023-04-28 MED ORDER — THIAMINE HCL 100 MG/ML IJ SOLN
100.0000 mg | Freq: Every day | INTRAMUSCULAR | Status: DC
  Administered 2023-05-06 – 2023-05-07 (×2): 100 mg via INTRAVENOUS
  Filled 2023-04-28 (×2): qty 2

## 2023-04-28 MED ORDER — LABETALOL HCL 5 MG/ML IV SOLN: 10.0000 mg | INTRAVENOUS | Status: DC | PRN

## 2023-04-28 MED ORDER — IPRATROPIUM-ALBUTEROL 0.5-2.5 (3) MG/3ML IN SOLN: 3.0000 mL | RESPIRATORY_TRACT | Status: DC | PRN

## 2023-04-28 MED ORDER — THIAMINE HCL 100 MG/ML IJ SOLN
500.0000 mg | Freq: Three times a day (TID) | INTRAVENOUS | Status: AC
  Administered 2023-04-28 – 2023-04-29 (×6): 500 mg via INTRAVENOUS
  Filled 2023-04-28 (×6): qty 5

## 2023-04-28 MED ORDER — VITAMIN B-12 1000 MCG PO TABS: 1000.0000 ug | ORAL_TABLET | Freq: Every day | ORAL | Status: DC

## 2023-04-28 MED ORDER — HEPARIN (PORCINE) 25000 UT/250ML-% IV SOLN
1450.0000 [IU]/h | INTRAVENOUS | Status: AC
  Administered 2023-04-28: 1100 [IU]/h via INTRAVENOUS
  Administered 2023-04-29: 1450 [IU]/h via INTRAVENOUS
  Filled 2023-04-28 (×2): qty 250

## 2023-04-28 NOTE — Progress Notes (Signed)
BLE venous duplex has been completed.  Preliminary findings given to Dr. Roda Shutters.    Results can be found under chart review under CV PROC. 04/28/2023 1:34 PM  RVT, RDMS

## 2023-04-28 NOTE — Progress Notes (Addendum)
ANTICOAGULATION CONSULT NOTE - Follow Up Consult  Pharmacy Consult for heparin Indication: DVT in setting of CVA  Labs: Recent Labs    04/26/23 0515 04/28/23 0325 04/28/23 2249  HGB 10.8* 9.8*  --   HCT 33.1* 29.6*  --   PLT 335 339  --   HEPARINUNFRC  --   --  0.14*  CREATININE 0.77 0.86  --     Assessment: 62yo male subtherapeutic on heparin with initial dosing for DVT during admission for acute CVA; no infusion issues or signs of bleeding per RN.  Goal of Therapy:  Heparin level 0.3-0.5 units/ml   Plan:  Increase heparin infusion by 3 units/kg/hr to 1300 units/hr. Check level in 6 hours.   Vernard Gambles, PharmD, BCPS 04/28/2023 11:31 PM

## 2023-04-28 NOTE — Consult Note (Signed)
NEUROLOGY CONSULTATION NOTE   Date of service: April 28, 2023 Patient Name: Scott Pruitt MRN:  [id number] DOB:  June 11, 1961 Reason for consult: "incidental stroke on MRI" Requesting Provider: Osvaldo Shipper, MD _ _ _   _ __   _ __ _ _  __ __   _ __   __ _  History of Present Illness  Scott Pruitt is a 62 y.o. male with PMH significant for HTN, HLD, admitted with R hip fracture s/p repair. Scott Pruitt was noted to have concern for cognitive impairment for which Scott Pruitt had an MRI Brain which was notable for 2 small punctate infarcts in R temporal lobe and left frontoparietal white matter. MRI also notable for chronic small vessel disease.  Neurology consulted for evaluation and workup. Dementia labs with low B12 levels, borderline folate.  Scott Pruitt is oriented to self, place, time, reasonable insight into what brought him to the hospital. Scott Pruitt is slow and has a mild resting hand tremor.  Scott Pruitt denies smoking, no EtOH intake, no recreational substances. Lives by himself. Reports eating fairly well despite noted B12 deficiency.  LKW: unclear since Scott Pruitt has no focal deficit mRS: 1 tNKASE: not offered 2/2 no symptoms. Thrombectomy: not offered 2/2 no symptoms. NIHSS components Score: Comment  1a Level of Conscious 0[x]  1[]  2[]  3[]      1b LOC Questions 0[]  1[x]  2[]      Thinks Scott Pruitt is 43.  1c LOC Commands 0[x]  1[]  2[]       2 Best Gaze 0[x]  1[]  2[]       3 Visual 0[x]  1[]  2[]  3[]      4 Facial Palsy 0[x]  1[]  2[]  3[]      5a Motor Arm - left 0[x]  1[]  2[]  3[]  4[]  UN[]    5b Motor Arm - Right 0[x]  1[]  2[]  3[]  4[]  UN[]    6a Motor Leg - Left 0[x]  1[]  2[]  3[]  4[]  UN[]    6b Motor Leg - Right 0[x]  1[]  2[]  3[]  4[]  UN[]    7 Limb Ataxia 0[x]  1[]  2[]  3[]  UN[]     8 Sensory 0[x]  1[]  2[]  UN[]      9 Best Language 0[x]  1[]  2[]  3[]      10 Dysarthria 0[x]  1[]  2[]  UN[]      11 Extinct. and Inattention 0[x]  1[]  2[]       TOTAL: 1       ROS   Constitutional Denies weight loss, fever and chills.   HEENT Denies changes in vision and  hearing.   Respiratory Denies SOB and cough.   CV Denies palpitations and CP   GI Denies abdominal pain, nausea, vomiting and diarrhea.   GU Denies dysuria and urinary frequency.   MSK Denies myalgia and joint pain.   Skin Denies rash and pruritus.   Neurological Denies headache and syncope.   Psychiatric Denies recent changes in mood. Denies anxiety and depression.    Past History   Past Medical History:  Diagnosis Date   Hyperlipidemia    Hypertension    Past Surgical History:  Procedure Laterality Date   FRACTURE SURGERY Left    left leg   INTRAMEDULLARY (IM) NAIL INTERTROCHANTERIC Right 04/18/2023   Procedure: INTRAMEDULLARY (IM) NAIL INTERTROCHANTERIC;  Surgeon: Terance Hart, MD;  Location: [facility] OR;  Service: Orthopedics;  Laterality: Right;   Family History  Problem Relation Age of Onset   Stroke Father    Hypertension Sister    Social History   Socioeconomic History   Marital status: Legally Separated    Spouse name: Not on file  Number of children: Not on file   Years of education: Not on file   Highest education level: Not on file  Occupational History   Not on file  Tobacco Use   Smoking status: Former   Smokeless tobacco: Never  Vaping Use   Vaping status: Never Used  Substance and Sexual Activity   Alcohol use: Yes    Alcohol/week: 1.0 standard drink of alcohol    Types: 1 Cans of beer per week    Comment: occ   Drug use: No   Sexual activity: Not Currently  Other Topics Concern   Not on file  Social History Narrative   Divorced; 60 year old son (2018)   2018 engaged to 91 yo woman who has 3 girls - 24, 38, 63 yo and they are expecting a baby girl 08/23/2017 (not planned)   Salesperson; drives a lot to locations   Social Determinants of Health   Financial Resource Strain: Not on file  Food Insecurity: Food Insecurity Present (04/18/2023)   Hunger Vital Sign    Worried About Running Out of Food in the Last Year: Never true    Ran Out of  Food in the Last Year: Sometimes true  Transportation Needs: No Transportation Needs (04/18/2023)   PRAPARE - Administrator, Civil Service (Medical): No    Lack of Transportation (Non-Medical): No  Physical Activity: Not on file  Stress: Not on file  Social Connections: Unknown (02/02/2022)   Received from [facility]   Social Network    Social Network: Not on file   No Known Allergies  Medications   Medications Prior to Admission  Medication Sig Dispense Refill Last Dose   lisinopril-hydrochlorothiazide (ZESTORETIC) 20-25 MG tablet Take 1 tablet by mouth daily.   unknown     Vitals   Vitals:   04/27/23 1817 04/27/23 1950 04/27/23 2054 04/28/23 0342  BP: 105/87 117/72 114/75 105/65  Pulse: 95 97 90 80  Resp: 16 17 17 14   Temp: 99.3 F (37.4 C) 98.7 F (37.1 C) 98.7 F (37.1 C) 97.7 F (36.5 C)  TempSrc: Oral  Oral Oral  SpO2: 98% 95% 96% 96%  Weight:      Height:         Body mass index is 23.34 kg/m.  Physical Exam   General: Laying comfortably in bed; in no acute distress.  HENT: Normal oropharynx and mucosa. Normal external appearance of ears and nose.  Neck: Supple, no pain or tenderness  CV: No JVD. No peripheral edema.  Pulmonary: Symmetric Chest rise. Normal respiratory effort.  Abdomen: Soft to touch, non-tender.  Ext: No cyanosis, edema, or deformity  Skin: No rash. Normal palpation of skin.   Musculoskeletal: Normal digits and nails by inspection. No clubbing.   Neurologic Examination  Mental status/Cognition: mildly somnolent,  oriented to self, place, month and year, good attention.  Speech/language: bradyphrenic, fluent, comprehension intact, object naming intact, repetition intact. Cranial nerves:   CN II Pupils equal and reactive to light, no VF deficits    CN III,IV,VI EOM intact, no gaze preference or deviation, no nystagmus    CN V normal sensation in V1, V2, and V3 segments bilaterally    CN VII no asymmetry, no nasolabial  fold flattening    CN VIII normal hearing to speech    CN IX & X normal palatal elevation, no uvular deviation    CN XI 5/5 head turn and 5/5 shoulder shrug bilaterally    CN XII  midline tongue protrusion   Motor:  Muscle bulk: poor, tone normal, pronator drift none tremor: mild resting hand tremor noted, right worse than left. Mvmt Root Nerve  Muscle Right Left Comments  SA C5/6 Ax Deltoid 5 5   EF C5/6 Mc Biceps 5 5   EE C6/7/8 Rad Triceps 5 5   WF C6/7 Med FCR     WE C7/8 PIN ECU     F Ab C8/T1 U ADM/FDI 5 5   HF L1/2/3 Fem Illopsoas 3 5 No R hip confrontational testing 2/2 recent R hip replacement.  KE L2/3/4 Fem Quad 4 5   DF L4/5 D Peron Tib Ant 4_ 5   PF S1/2 Tibial Grc/Sol 4+ 5    Sensation:  Light touch Intact throughout   Pin prick    Temperature    Vibration   Proprioception    Coordination/Complex Motor:  - Finger to Nose intact BL - Heel to shin unable to get him to do due to R hip fracture - Rapid alternating movement are slowed BL - Gait: deferred for patient safety.  Labs   CBC:  Recent Labs  Lab 04/26/23 0515 04/28/23 0325  WBC 10.0 12.3*  HGB 10.8* 9.8*  HCT 33.1* 29.6*  MCV 88.5 89.4  PLT 335 339    Basic Metabolic Panel:  Lab Results  Component Value Date   NA 129 (L) 04/28/2023   K 4.0 04/28/2023   CO2 25 04/28/2023   GLUCOSE 94 04/28/2023   BUN 22 04/28/2023   CREATININE 0.86 04/28/2023   CALCIUM 8.5 (L) 04/28/2023   GFRNONAA >60 04/28/2023   GFRAA 92 09/12/2018   Lipid Panel:  Lab Results  Component Value Date   LDLCALC 61 04/28/2023   HgbA1c:  Lab Results  Component Value Date   HGBA1C 5.4 04/28/2023   Urine Drug Screen: No results found for: "LABOPIA", "COCAINSCRNUR", "LABBENZ", "AMPHETMU", "THCU", "LABBARB"  Alcohol Level No results found for: "ETH"  CT Head without contrast(Personally reviewed): CTH was negative for a large hypodensity concerning for a large territory infarct or hyperdensity concerning for an  ICH  CT angio Head and Neck with contrast(Personally reviewed): No LVO  MRI Brain(Personally reviewed): 2 small punctate infarcts in R temporal lobe and left frontoparietal white matter.   Impression   Scott Pruitt is a 62 y.o. male with PMH significant for HTN, HLD, admitted with R hip fracture s/p repair. Scott Pruitt was noted to have concern for cognitive impairment for which Scott Pruitt had an MRI Brain which was notable for 2 small punctate infarcts in R temporal lobe and left frontoparietal white matter.Marland Kitchen  Scott Pruitt is slow and may have at most mild cognitive impairment. Still has fairly good insight into what is going on. With the noted B12 deficiency and the fact that this can cause reversible dementia/cognitive impiarment, important to replace Vit B12. Recommendations  - Frequent Neuro checks per stroke unit protocol - Recommend Vascular imaging with CTA head and neck - Recommend obtaining TTE - Recommend obtaining Lipid panel with LDL - Please start statin if LDL > 70 - Recommend HbA1c to evaluate for diabetes and how well it is controlled. - Antithrombotic - Aspirin 81mg  daily along with plavix 75mg  daily x 21 days, followed by Aspirin 81mg  daily alone. - Recommend DVT ppx - SBP goal - aim for gradual normotension. - Recommend Telemetry monitoring for arrythmia - Recommend bedside swallow screen prior to PO intake. - Stroke education booklet - Recommend PT/OT/SLP consult - Start high dose  thiamine. Seems like Scott Pruitt is on low dose thiamine here and levels unfortunately would be unreliable at this time. ______________________________________________________________________   Thank you for the opportunity to take part in the care of this patient. If you have any further questions, please contact the neurology consultation attending.  Signed,  Erick Blinks [facility] _ _ _   _ __   _ __ _ _  __ __   _ __   __ _

## 2023-04-28 NOTE — Progress Notes (Signed)
TCD bubble study has been completed.   Results can be found under chart review under CV PROC. 04/28/2023 5:55 PM  RVT, RDMS

## 2023-04-28 NOTE — Progress Notes (Signed)
Patient off the unit for ECHO

## 2023-04-28 NOTE — Progress Notes (Signed)
ANTICOAGULATION CONSULT NOTE - Initial Consult  Pharmacy Consult for Heparin Indication: DVT  No Known Allergies  Patient Measurements: Height: 5\' 9"  (175.3 cm) Weight: 71.7 kg (158 lb 1.1 oz) IBW/kg (Calculated) : 70.7 Heparin Dosing Weight: total weight  Vital Signs: Temp: 97.4 F (36.3 C) (08/04 1336) Temp Source: Axillary (08/04 1336) BP: 122/69 (08/04 1336) Pulse Rate: 82 (08/04 1336)  Labs: Recent Labs    04/26/23 0515 04/28/23 0325  HGB 10.8* 9.8*  HCT 33.1* 29.6*  PLT 335 339  CREATININE 0.77 0.86    Estimated Creatinine Clearance: 87.9 mL/min (by C-G formula based on SCr of 0.86 mg/dL).   Medical History: Past Medical History:  Diagnosis Date   Hyperlipidemia    Hypertension     Medications:  Scheduled:   aspirin  300 mg Rectal Daily   Or   aspirin  325 mg Oral Daily   calcium carbonate  1 tablet Oral Q breakfast   cholecalciferol  1,000 Units Oral Daily   [START ON 04/29/2023] vitamin B-12  1,000 mcg Oral Daily   docusate sodium  100 mg Oral BID   enoxaparin (LOVENOX) injection  40 mg Subcutaneous Q24H   multivitamin with minerals  1 tablet Oral Daily   [START ON 05/06/2023] thiamine (VITAMIN B1) injection  100 mg Intravenous Daily   Infusions:   sodium chloride 75 mL/hr at 04/28/23 0857   thiamine (VITAMIN B1) injection 500 mg (04/28/23 1342)   Followed by   Melene Muller ON 04/30/2023] thiamine (VITAMIN B1) injection     PRN: acetaminophen **OR** acetaminophen, fentaNYL (SUBLIMAZE) injection, ipratropium-albuterol, labetalol, melatonin, naLOXone (NARCAN)  injection, ondansetron (ZOFRAN) IV, oxyCODONE  Assessment: 62 yo male who sustained a R hip fracture s/p IM nail 7/25. While awaiting placement, MRI showed acute CVA.  Now, doppler 8/4 showed acute DVT in RLE. Pharmacy consulted to dose IV heparin for VTE treatment. Currently receiving Lovenox 40mg  daily - last dose at 07:36  Goal of Therapy:  Heparin level 0.3-0.7 units/ml - verified goal range with  Dr. Nelson Chimes Monitor platelets by anticoagulation protocol: Yes   Plan:  No heparin bolus Start heparin infusion at 1100 units/hr Check anti-Xa level in 6 hours and daily while on heparin Continue to monitor H&H and platelets  Loralee Pacas, PharmD, BCPS 04/28/2023,2:44 PM  Please check AMION for all [facility] phone numbers After 10:00 PM, call Main Pharmacy [phone]

## 2023-04-28 NOTE — Progress Notes (Addendum)
PROGRESS NOTE    Scott Pruitt  XBJ:[id number] DOB: September 10, 1961 DOA: 04/17/2023 PCP: Pcp, No   Brief Narrative:  62 year old with history of HTN, HLD who presented to the hospital after a fall sustaining a right hip fracture.  Patient underwent IM nail of the right hip by Dr. Susa Simmonds on 7/25 thereafter PT OT recommended SNF.  While awaiting placement patient did have some cognitive decline prompting to obtain MRI of the brain which ended up showing acute CVA.  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  Neurology team was consulted.  CTA head and neck does show some vertebral stenosis. - A1c 5.4, LDL 61 - Aspirin and Plavix for 21 days followed by aspirin. - PT/OT-SNF - Started high-dose thiamine -Echo Addendum found to have DVT, started Hep drip. Ortho aware as well.   Closed right hip fracture status post IM nail 7/25 ORIF by Ortho Dr. Susa Simmonds 7/25.  Follow-up outpatient recommended in around 2 weeks Initial recommendations was to be on aspirin full dose twice daily for DVT prophylaxis   Hypokalemia/hyponatremia As needed repletion Dehydration, IV fluids   Normocytic anemia Hemoglobin overall stable around 9.8 Does have low saturation, will give one-time IV iron while here   Vitamin D deficiency Vitamin D and calcium supplements   Transaminitis Resolved.  Denies alcohol use.  Viral serologies are negative   Hyperlipidemia Not on statin at present   Essential hypertension Discontinue antihypertensives for now for  permissive hypertension.  IV as needed.  3 mm pulmonary nodule - Follow-up outpatient   DVT prophylaxis: Lovenox Code Status: Full code Family Communication:   Status is: Inpatient Remains inpatient appropriate because: SNF once stroke evaluation has been completed   Pressure Injury 04/22/23 Sacrum Mid Stage 2 -  Partial thickness loss of dermis presenting as a shallow open injury with a red, pink wound bed without slough. blister (Active)  04/22/23 0230  Location: Sacrum  Location Orientation: Mid  Staging: Stage 2 -  Partial thickness loss of dermis presenting as a shallow open injury with a red, pink wound bed without slough.  Wound Description (Comments): blister  Present on Admission: No     Diet Orders (From admission, onward)     Start     Ordered   04/18/23 1731  Diet regular Fluid consistency: Thin  Diet effective now       Question:  Fluid consistency:  Answer:  Thin   04/18/23 1731            Subjective: Doing ok, no new complaints.    Examination:  General exam: Appears calm and comfortable  Respiratory system: Clear to auscultation. Respiratory effort normal. Cardiovascular system: S1 & S2 heard, RRR. No JVD, murmurs, rubs, gallops or clicks. No pedal edema. Gastrointestinal system: Abdomen is nondistended, soft and nontender. No organomegaly or masses felt. Normal bowel sounds heard. Central nervous system: Alert and oriented. No focal neurological deficits. Extremities: Symmetric 5 x 5 power. Skin: No rashes, lesions or ulcers Psychiatry: Judgement and insight appear normal. Mood &  affect appropriate.  Objective: Vitals:   04/27/23 1817 04/27/23 1950 04/27/23 2054 04/28/23 0342  BP: 105/87 117/72 114/75 105/65  Pulse: 95 97 90 80  Resp: 16 17 17 14   Temp: 99.3 F (37.4 C) 98.7 F (37.1 C) 98.7 F (37.1 C) 97.7 F (36.5 C)  TempSrc: Oral  Oral Oral  SpO2: 98% 95% 96% 96%  Weight:      Height:        Intake/Output Summary (Last 24  hours) at 04/28/2023 1610 Last data filed at 04/28/2023 0358 Gross per 24 hour  Intake --  Output 620 ml  Net -620 ml   Filed Weights   04/23/23 0600 04/24/23 0432 04/26/23 0500  Weight: 72.3 kg 73 kg 71.7 kg    Scheduled Meds:   stroke: early stages of recovery book   Does not apply Once   amLODipine  5 mg Oral Daily   aspirin  300 mg Rectal Daily   Or   aspirin  325 mg Oral Daily   calcium carbonate  1 tablet Oral Q breakfast   cholecalciferol  1,000 Units Oral Daily   [START ON 04/29/2023] vitamin B-12  1,000 mcg Oral Daily   docusate sodium  100 mg Oral BID   enoxaparin (LOVENOX) injection  40 mg Subcutaneous Q24H   lisinopril  20 mg Oral Daily   multivitamin with minerals  1 tablet Oral Daily   [START ON 05/06/2023] thiamine (VITAMIN B1) injection  100 mg Intravenous Daily   Continuous Infusions:  thiamine (VITAMIN B1) injection 500 mg (04/28/23 0700)   Followed by   Melene Muller ON 04/30/2023] thiamine (VITAMIN B1) injection      Nutritional status     Body mass index is 23.34 kg/m.  Data Reviewed:   CBC: Recent Labs  Lab 04/23/23 0243 04/26/23 0515 04/28/23 0325  WBC 9.6 10.0 12.3*  HGB 11.2* 10.8* 9.8*  HCT 33.6* 33.1* 29.6*  MCV 88.4 88.5 89.4  PLT 250 335 339   Basic Metabolic Panel: Recent Labs  Lab 04/22/23 0212 04/23/23 0243 04/25/23 0242 04/26/23 0515 04/28/23 0325  NA 130* 132*  --  133* 129*  K  --  3.9  --  4.3 4.0  CL  --  93*  --  93* 95*  CO2  --  30  --  30 25  GLUCOSE  --  111*  --  99 94  BUN  --  21  --  18 22  CREATININE  --  0.78 0.92 0.77 0.86  CALCIUM  --  8.6*  --  8.7* 8.5*   GFR: Estimated Creatinine Clearance: 87.9 mL/min (by C-G formula based on SCr of 0.86 mg/dL). Liver Function Tests: No results for input(s): "AST", "ALT", "ALKPHOS", "BILITOT", "PROT", "ALBUMIN" in the last 168 hours. No results for input(s): "LIPASE", "AMYLASE" in the last 168 hours. No results for input(s): "AMMONIA" in the last 168 hours. Coagulation  Profile: No results for input(s): "INR", "PROTIME" in the last 168 hours. Cardiac Enzymes: No results for input(s): "CKTOTAL", "CKMB", "CKMBINDEX", "TROPONINI" in the last 168 hours. BNP (last 3 results) No results for input(s): "PROBNP" in the last 8760 hours. HbA1C: Recent Labs    04/28/23 0325  HGBA1C 5.4   CBG: No results for input(s): "GLUCAP" in the last 168 hours. Lipid Profile: Recent Labs    04/28/23 0325  CHOL 105  HDL 33*  LDLCALC 61  TRIG 55  CHOLHDL 3.2   Thyroid Function Tests: Recent Labs    04/28/23  0325  TSH 1.011   Anemia Panel: Recent Labs    04/28/23 0325  VITAMINB12 165*  FOLATE 6.4  FERRITIN 317  TIBC 227*  IRON 13*  RETICCTPCT 2.0   Sepsis Labs: No results for input(s): "PROCALCITON", "LATICACIDVEN" in the last 168 hours.  No results found for this or any previous visit (from the past 240 hour(s)).       Radiology Studies: CT ANGIO HEAD NECK W WO CM  Result Date: 04/28/2023 CLINICAL DATA:  Follow-up exam for stroke. EXAM: CT ANGIOGRAPHY HEAD AND NECK WITH AND WITHOUT CONTRAST TECHNIQUE: Multidetector CT imaging of the head and neck was performed using the standard protocol during bolus administration of intravenous contrast. Multiplanar CT image reconstructions and MIPs were obtained to evaluate the vascular anatomy. Carotid stenosis measurements (when applicable) are obtained utilizing NASCET criteria, using the distal internal carotid diameter as the denominator. RADIATION DOSE REDUCTION: This exam was performed according to the departmental dose-optimization program which includes automated exposure control, adjustment of the mA and/or kV according to patient size and/or use of iterative reconstruction technique. CONTRAST:  75mL OMNIPAQUE IOHEXOL 350 MG/ML SOLN COMPARISON:  Prior study from earlier the same day. FINDINGS: CT HEAD FINDINGS Brain: Cerebral volume within normal limits. Chronic microvascular ischemic disease with remote  lacunar infarcts at the right basal ganglia and right thalamus. Previously identified punctate infarcts not visible by CT. No other acute large vessel territory infarct. No intracranial hemorrhage. No mass lesion or midline shift. No hydrocephalus or extra-axial fluid collection. Vascular: No hyperdense vessel. Skull: Small lipoma noted at the scalp vertex. No acute abnormality. Calvarium intact. Sinuses/Orbits: Globes and orbital soft tissues demonstrate no acute abnormality. Paranasal sinuses are largely clear. No mastoid effusion. Other: None. Review of the MIP images confirms the above findings CTA NECK FINDINGS Aortic arch: Visualized arch within normal limits for caliber. Origin of the great vessels incompletely visualized. Right carotid system: Right common and internal carotid arteries are patent without dissection. Mild atheromatous irregularity about the right carotid bulb without stenosis. Left carotid system: Left common and internal carotid arteries are patent without dissection. Mild atheromatous irregularity about the left carotid bulb without stenosis. Vertebral arteries: Both vertebral arteries arise from the subclavian arteries. No proximal subclavian artery stenosis. Right vertebral artery dominant. Atheromatous plaque at the origin of the left vertebral artery with severe ostial stenosis. Vertebral arteries otherwise patent without stenosis or dissection. Skeleton: No discrete or worrisome osseous lesions. Moderate spondylosis present at C6-7. Other neck: No other acute finding. Upper chest: Mild emphysema. 3 mm left upper lobe pulmonary nodule noted (series 9, image 12). Review of the MIP images confirms the above findings CTA HEAD FINDINGS Anterior circulation: Both internal carotid arteries are patent to the termini without stenosis. A1 segments, anterior to artery complex common anterior cerebral arteries widely patent. No M1 stenosis or occlusion. No proximal MCA branch occlusion or  high-grade stenosis. Distal MCA branches perfused and symmetric. Posterior circulation: Both V4 segments widely patent. Both PICA patent. Basilar widely patent without stenosis. Superior cerebellar arteries patent bilaterally. Right PCA supplied via the basilar as well as a prominent right posterior communicating artery. Fetal type origin of the left PCA. Both PCAs patent without stenosis. Venous sinuses: Patent allowing for timing the contrast bolus. Anatomic variants: As above.  No aneurysm. Review of the MIP images confirms the above findings IMPRESSION: CT HEAD IMPRESSION: 1. Previously identified punctate infarcts not visible by CT. No other acute intracranial abnormality. 2. Chronic microvascular ischemic disease with remote  lacunar infarcts at the right basal ganglia and right thalamus. CTA HEAD AND NECK IMPRESSION: 1. Negative CTA for large vessel occlusion or other emergent finding. 2. 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 3. Otherwise wide patency of the major arterial vasculature of the head and neck. No other hemodynamically significant or correctable stenosis. 4. 3 mm left upper lobe pulmonary nodule, indeterminate. Per Fleischner Society Guidelines, a non-contrast Chest CT at 12 months is optional. If performed and the nodule is stable at 12 months, no further follow-up is recommended. These guidelines do not apply to immunocompromised patients and patients with cancer. Follow up in patients with significant comorbidities as clinically warranted. For lung cancer screening, adhere to Lung-RADS guidelines. Reference: Radiology. 2017; 284(1):228-43. Aortic Atherosclerosis (ICD10-I70.0) and Emphysema (ICD10-J43.9). Electronically Signed   By: Rise Mu M.D.   On: 04/28/2023 03:40   MR BRAIN WO CONTRAST  Result Date: 04/27/2023 CLINICAL DATA:  Tremor.  Memory impairment. EXAM: MRI HEAD WITHOUT CONTRAST TECHNIQUE: Multiplanar, multiecho pulse  sequences of the brain and surrounding structures were obtained without intravenous contrast. COMPARISON:  None Available. FINDINGS: Brain: There are 2 punctate acute infarctions. One is present along the anterolateral margin of the temporal horn of the right lateral ventricle within the temporal lobe. The other is within the left frontoparietal vertex subcortical white matter. Small acute infarctions in these locations suggest embolic disease from the heart or ascending aorta. There chronic small-vessel ischemic changes affecting the pons. No focal cerebellar insult. Cerebral hemispheres otherwise show chronic small-vessel ischemic change of the white matter with a few old small vessel infarctions in the thalami and basal ganglia. No large vessel territory stroke. No mass, hemorrhage, hydrocephalus or extra-axial collection. Vascular: Major vessels at the base of the brain show flow. Skull and upper cervical spine: Negative Sinuses/Orbits: Clear/normal.  Some dysconjugate gaze. Other: None IMPRESSION: 1. Two punctate acute infarctions, in the right temporal lobe and the left frontoparietal vertex subcortical white matter. 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 2. Chronic small-vessel ischemic changes elsewhere throughout the brain as outlined above. Electronically Signed   By: Paulina Fusi M.D.   On: 04/27/2023 17:27   DG Abd Portable 1V  Result Date: 04/27/2023 CLINICAL DATA:  Foreign body in digestive tract EXAM: PORTABLE ABDOMEN - 1 VIEW SKULL - 2 VIEW COMPARISON:  None Available. FINDINGS: No radiopaque foreign body in the imaged portions of the skull and upper cervical spine. The bowel gas pattern is normal. No radio-opaque calculi or other significant radiographic abnormality are seen. IMPRESSION: No metallic foreign body Electronically Signed   By: Lorenza Cambridge M.D.   On: 04/27/2023 15:18   DG Skull 1-3 Views  Result Date: 04/27/2023 CLINICAL DATA:  Foreign body in digestive  tract EXAM: PORTABLE ABDOMEN - 1 VIEW SKULL - 2 VIEW COMPARISON:  None Available. FINDINGS: No radiopaque foreign body in the imaged portions of the skull and upper cervical spine. The bowel gas pattern is normal. No radio-opaque calculi or other significant radiographic abnormality are seen. IMPRESSION: No metallic foreign body Electronically Signed   By: Lorenza Cambridge M.D.   On: 04/27/2023 15:18           LOS: 11 days   Time spent= 35 mins     Joline Maxcy, MD [facility]  If 7PM-7AM, please contact night-coverage  04/28/2023, 8:21 AM

## 2023-04-28 NOTE — Evaluation (Addendum)
Clinical/Bedside Swallow Evaluation Patient Details  Name: Scott Pruitt MRN: [id number] Date of Birth: 25-Nov-1960  Today's Date: 04/28/2023 Time: SLP Start Time (ACUTE ONLY): 1600 SLP Stop Time (ACUTE ONLY): 1608 SLP Time Calculation (min) (ACUTE ONLY): 8 min  Past Medical History:  Past Medical History:  Diagnosis Date   Hyperlipidemia    Hypertension    Past Surgical History:  Past Surgical History:  Procedure Laterality Date   FRACTURE SURGERY Left    left leg   INTRAMEDULLARY (IM) NAIL INTERTROCHANTERIC Right 04/18/2023   Procedure: INTRAMEDULLARY (IM) NAIL INTERTROCHANTERIC;  Surgeon: Terance Hart, MD;  Location: [facility] OR;  Service: Orthopedics;  Laterality: Right;   HPI:  Scott Pruitt is a 62 year old male who presented to the hospital after a fall sustaining a right hip fracture.  Patient underwent IM nail of the right hip by Dr. Susa Simmonds on 7/25 thereafter PT OT recommended SNF.  While awaiting placement patient did have some cognitive decline prompting to obtain MRI of the brain which ended up showing acute CVA: "Two punctate acute infarctions, in the right temporal lobe and  the left frontoparietal vertex subcortical white matter."  Neurology team was consulted. Pt with history of HTN, HLD.    Assessment / Plan / Recommendation  Clinical Impression  Pt presents with mild risk for aspiration in setting of new infarcts on MRI 8/3.  Today pt exhibited delayed, dry throat clear following initial trial of thin liquids. There was no additional cough or throat clear with further trials including serial straw sips, but pt required multiple swallows with thin liquids intermittently.  He denies feeling of stasis or of liquid going down the wrong way.  Pt declined further trials as SLP challenged liquids.  CXR 8/4 with no active disease. Pt passed [facility] swallow screen and RN reports no difficulty with POs.  Pt exhibited adequate oral clearance of puree and regular texture solids without any  overt s/s of aspiration.    Recommend continuing regular texture diet with thin liquids.  SLP Visit Diagnosis: Dysphagia, unspecified (R13.10)    Aspiration Risk  Mild aspiration risk    Diet Recommendation Regular;Thin liquid    Liquid Administration via: Cup;Straw Medication Administration: Whole meds with liquid Supervision: Staff to assist with self feeding Compensations: Slow rate;Small sips/bites Postural Changes: Seated upright at 90 degrees    Other  Recommendations Oral Care Recommendations: Oral care BID    Recommendations for follow up therapy are one component of a multi-disciplinary discharge planning process, led by the attending physician.  Recommendations may be updated based on patient status, additional functional criteria and insurance authorization.  Follow up Recommendations Skilled nursing-short term rehab (<3 hours/day)      Assistance Recommended at Discharge  N/A  Functional Status Assessment Patient has had a recent decline in their functional status and demonstrates the ability to make significant improvements in function in a reasonable and predictable amount of time.  Frequency and Duration min 2x/week  2 weeks       Prognosis Prognosis for improved oropharyngeal function: Good      Swallow Study   General Date of Onset: 04/17/23 HPI: Scott Pruitt is a 62 year old male who presented to the hospital after a fall sustaining a right hip fracture.  Patient underwent IM nail of the right hip by Dr. Susa Simmonds on 7/25 thereafter PT OT recommended SNF.  While awaiting placement patient did have some cognitive decline prompting to obtain MRI of the brain which ended up showing acute CVA: "Two punctate  acute infarctions, in the right temporal lobe and  the left frontoparietal vertex subcortical white matter."  Neurology team was consulted. Pt with history of HTN, HLD. Type of Study: Bedside Swallow Evaluation Previous Swallow Assessment: None Diet Prior to this  Study: Regular;Thin liquids (Level 0) Temperature Spikes Noted: No Respiratory Status: Room air History of Recent Intubation: No Behavior/Cognition: Alert;Cooperative;Pleasant mood Oral Cavity Assessment: Within Functional Limits Oral Care Completed by SLP: No Oral Cavity - Dentition: Adequate natural dentition Patient Positioning: Upright in bed Baseline Vocal Quality: Normal Volitional Cough: Strong Volitional Swallow: Able to elicit    Oral/Motor/Sensory Function Overall Oral Motor/Sensory Function: Within functional limits Facial ROM: Within Functional Limits Facial Symmetry: Within Functional Limits Facial Strength: Within Functional Limits Lingual ROM: Reduced right (improved with encouragement) Lingual Symmetry: Within Functional Limits Lingual Strength: Within Functional Limits Velum: Within Functional Limits Mandible: Within Functional Limits   Ice Chips Ice chips: Not tested   Thin Liquid Thin Liquid: Impaired Pharyngeal  Phase Impairments: Throat Clearing - Delayed;Multiple swallows    Nectar Thick Nectar Thick Liquid: Not tested   Honey Thick Honey Thick Liquid: Not tested   Puree Puree: Within functional limits Presentation: Spoon   Solid     Solid: Within functional limits Presentation:  (SLP fed)      Kerrie Pleasure, MA, CCC-SLP Acute Rehabilitation Services Office: [phone] 04/28/2023,4:24 PM

## 2023-04-28 NOTE — Progress Notes (Addendum)
STROKE TEAM PROGRESS NOTE   BRIEF HPI Scott Pruitt is a 62 y.o. male with PMH significant for HTN, HLD, admitted with R hip fracture s/p repair. He was noted to have concern for cognitive impairment for which he had an MRI Brain which was notable for 2 small punctate infarcts in R temporal lobe and left frontoparietal white matter. MRI also notable for chronic small vessel disease.    SIGNIFICANT HOSPITAL EVENTS 7/25 Hip Surgery (R intertrochanteric fracture)   INTERIM HISTORY/SUBJECTIVE Patient is cooperative during interview but does not provide significant amounts of information. Patient reports no concerns. Patient reports that he is not experiencing significant confusion and that he is only a "little bit" from his baseline, although he could not give specifics for deviation from baseline. He denies having headache, dipolopia (L 6th nerve palsy, see PE), palpitation, gait instability. He reports that he is normally lives by himself in a hotel. Reports at baseline that he ambulates okay with out a cane or walker. He denies using alcohol, nicotine, or other substances. Reports that he eats okay and does not eat a vegan diet.    OBJECTIVE  CBC    Component Value Date/Time   WBC 12.3 (H) 04/28/2023 0325   RBC 3.49 (L) 04/28/2023 0325   RBC 3.31 (L) 04/28/2023 0325   HGB 9.8 (L) 04/28/2023 0325   HGB 15.7 09/12/2018 1103   HCT 29.6 (L) 04/28/2023 0325   HCT 45.9 09/12/2018 1103   PLT 339 04/28/2023 0325   PLT 274 09/12/2018 1103   MCV 89.4 04/28/2023 0325   MCV 90 09/12/2018 1103   MCH 29.6 04/28/2023 0325   MCHC 33.1 04/28/2023 0325   RDW 13.1 04/28/2023 0325   RDW 13.0 09/12/2018 1103   LYMPHSABS 1.0 04/18/2023 0043   MONOABS 1.2 (H) 04/18/2023 0043   EOSABS 0.0 04/18/2023 0043   BASOSABS 0.0 04/18/2023 0043    BMET    Component Value Date/Time   NA 129 (L) 04/28/2023 0325   NA 141 09/12/2018 1103   K 4.0 04/28/2023 0325   CL 95 (L) 04/28/2023 0325   CO2 25 04/28/2023  0325   GLUCOSE 94 04/28/2023 0325   BUN 22 04/28/2023 0325   BUN 11 09/12/2018 1103   CREATININE 0.86 04/28/2023 0325   CREATININE 0.99 12/29/2015 0815   CALCIUM 8.5 (L) 04/28/2023 0325   GFRNONAA >60 04/28/2023 0325    IMAGING past 24 hours VAS [place] LOWER EXTREMITY VENOUS (DVT)  Result Date: 04/28/2023  Lower Venous DVT Study Patient Name:  SIPRIANO FENDLEY  Date of Exam:   04/28/2023 Medical Rec #: [id number]     Accession #:    [id number] Date of Birth: [birth year]     Patient Gender: M Patient Age:   21 years Exam Location:  [facility] Procedure:      VAS [place] LOWER EXTREMITY VENOUS (DVT) Referring Phys: Scheryl Marten  --------------------------------------------------------------------------------  Indications: Stroke.  Risk Factors: Recent fall S/P IM nailing of right hip on 04/18/23. Limitations: Patient positioning. Comparison Study: No previous exams Performing Technologist: Jody Hill RVT, RDMS  Examination Guidelines: A complete evaluation includes B-mode imaging, spectral Doppler, color Doppler, and power Doppler as needed of all accessible portions of each vessel. Bilateral testing is considered an integral part of a complete examination. Limited examinations for reoccurring indications may be performed as noted. The reflux portion of the exam is performed with the patient in reverse Trendelenburg.  +---------+---------------+---------+-----------+----------+--------------+ RIGHT    CompressibilityPhasicitySpontaneityPropertiesThrombus Aging +---------+---------------+---------+-----------+----------+--------------+ CFV  Full           Yes      Yes                                 +---------+---------------+---------+-----------+----------+--------------+ SFJ      Full                                                        +---------+---------------+---------+-----------+----------+--------------+ FV Prox  None           No       No                   Acute           +---------+---------------+---------+-----------+----------+--------------+ FV Mid   None           No       No                   Acute          +---------+---------------+---------+-----------+----------+--------------+ FV DistalNone           No       No                   Acute          +---------+---------------+---------+-----------+----------+--------------+ PFV      Full                                                        +---------+---------------+---------+-----------+----------+--------------+ POP      None           No       No                   Acute          +---------+---------------+---------+-----------+----------+--------------+ PTV      None           No       No                   Acute          +---------+---------------+---------+-----------+----------+--------------+ PERO     None           No       No                   Acute          +---------+---------------+---------+-----------+----------+--------------+   +---------+---------------+---------+-----------+----------+-------------------+ LEFT     CompressibilityPhasicitySpontaneityPropertiesThrombus Aging      +---------+---------------+---------+-----------+----------+-------------------+ CFV      Full           Yes      Yes                                      +---------+---------------+---------+-----------+----------+-------------------+ SFJ      Full                                                             +---------+---------------+---------+-----------+----------+-------------------+  FV Prox  Full           Yes      Yes                                      +---------+---------------+---------+-----------+----------+-------------------+ FV Mid   Full           Yes      Yes                                      +---------+---------------+---------+-----------+----------+-------------------+ FV DistalFull           Yes      Yes                                       +---------+---------------+---------+-----------+----------+-------------------+ PFV      Full                                                             +---------+---------------+---------+-----------+----------+-------------------+ POP      Full           Yes      Yes                                      +---------+---------------+---------+-----------+----------+-------------------+ PTV      Full                                                             +---------+---------------+---------+-----------+----------+-------------------+ PERO                                                  Not well visualized +---------+---------------+---------+-----------+----------+-------------------+    Summary: BILATERAL: -No evidence of popliteal cyst, bilaterally. RIGHT: - Findings consistent with acute deep vein thrombosis involving the right femoral vein, right popliteal vein, right posterior tibial veins, and right peroneal veins.  LEFT: - There is no evidence of deep vein thrombosis in the lower extremity.  *See table(s) above for measurements and observations.    Preliminary    DG CHEST PORT 1 VIEW  Result Date: 04/28/2023 CLINICAL DATA:  Leukocytosis EXAM: PORTABLE CHEST 1 VIEW COMPARISON:  04/17/2023 FINDINGS: The heart size and mediastinal contours are within normal limits. Both lungs are clear. The visualized skeletal structures are unremarkable. IMPRESSION: No active disease. Electronically Signed   By: Paulina Fusi M.D.   On: 04/28/2023 10:07   CT ANGIO HEAD NECK W WO CM  Result Date: 04/28/2023 CLINICAL DATA:  Follow-up exam for stroke. EXAM: CT ANGIOGRAPHY HEAD AND NECK WITH AND WITHOUT CONTRAST TECHNIQUE: Multidetector CT imaging of the head and neck was performed using the standard protocol  during bolus administration of intravenous contrast. Multiplanar CT image reconstructions and MIPs were obtained to evaluate the vascular anatomy. Carotid stenosis  measurements (when applicable) are obtained utilizing NASCET criteria, using the distal internal carotid diameter as the denominator. RADIATION DOSE REDUCTION: This exam was performed according to the departmental dose-optimization program which includes automated exposure control, adjustment of the mA and/or kV according to patient size and/or use of iterative reconstruction technique. CONTRAST:  75mL OMNIPAQUE IOHEXOL 350 MG/ML SOLN COMPARISON:  Prior study from earlier the same day. FINDINGS: CT HEAD FINDINGS Brain: Cerebral volume within normal limits. 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 Previously identified punctate infarcts not visible by CT. No other acute large vessel territory infarct. No intracranial hemorrhage. No mass lesion or midline shift. No hydrocephalus or extra-axial fluid collection. Vascular: No hyperdense vessel. Skull: Small lipoma noted at the scalp vertex. No acute abnormality. Calvarium intact. Sinuses/Orbits: Globes and orbital soft tissues demonstrate no acute abnormality. Paranasal sinuses are largely clear. No mastoid effusion. Other: None. Review of the MIP images confirms the above findings CTA NECK FINDINGS Aortic arch: Visualized arch within normal limits for caliber. Origin of the great vessels incompletely visualized. Right carotid system: Right common and internal carotid arteries are patent without dissection. Mild atheromatous irregularity about the right carotid bulb without stenosis. Left carotid system: Left common and internal carotid arteries are patent without dissection. Mild atheromatous irregularity about the left carotid bulb without stenosis. Vertebral arteries: Both vertebral arteries arise from the subclavian arteries. No proximal subclavian artery stenosis. Right vertebral artery dominant. Atheromatous plaque at the origin of the left vertebral artery with severe ostial stenosis.  Vertebral arteries otherwise patent without stenosis or dissection. Skeleton: No discrete or worrisome osseous lesions. Moderate spondylosis present at C6-7. Other neck: No other acute finding. Upper chest: Mild emphysema. 3 mm left upper lobe pulmonary nodule noted (series 9, image 12). Review of the MIP images confirms the above findings CTA HEAD FINDINGS Anterior circulation: Both internal carotid arteries are patent to the termini without stenosis. A1 segments, anterior to artery complex common anterior cerebral arteries widely patent. No M1 stenosis or occlusion. No proximal MCA branch occlusion or high-grade stenosis. Distal MCA branches perfused and symmetric. Posterior circulation: Both V4 segments widely patent. Both PICA patent. Basilar widely patent without stenosis. Superior cerebellar arteries patent bilaterally. Right PCA supplied via the basilar as well as a prominent right posterior communicating artery. Fetal type origin of the left PCA. Both PCAs patent without stenosis. Venous sinuses: Patent allowing for timing the contrast bolus. Anatomic variants: As above.  No aneurysm. Review of the MIP images confirms the above findings IMPRESSION: CT HEAD IMPRESSION: 1. Previously identified punctate infarcts not visible by CT. No other acute intracranial abnormality. 2. 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 CTA HEAD AND NECK IMPRESSION: 1. Negative CTA for large vessel occlusion or other emergent finding. 2. 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 3. Otherwise wide patency of the major arterial vasculature of the head and neck. No other hemodynamically significant or correctable stenosis. 4. 3 mm left upper lobe pulmonary nodule, indeterminate. Per Fleischner Society Guidelines, a non-contrast Chest CT at 12 months is optional. If performed and the nodule is stable at 12 months, no  further follow-up is recommended. These guidelines do not apply to immunocompromised patients and patients with cancer. Follow up in  patients with significant comorbidities as clinically warranted. For lung cancer screening, adhere to Lung-RADS guidelines. Reference: Radiology. 2017; 284(1):228-43. Aortic Atherosclerosis (ICD10-I70.0) and Emphysema (ICD10-J43.9). Electronically Signed   By: Rise Mu M.D.   On: 04/28/2023 03:40   MR BRAIN WO CONTRAST  Result Date: 04/27/2023 CLINICAL DATA:  Tremor.  Memory impairment. EXAM: MRI HEAD WITHOUT CONTRAST TECHNIQUE: Multiplanar, multiecho pulse sequences of the brain and surrounding structures were obtained without intravenous contrast. COMPARISON:  None Available. FINDINGS: Brain: There are 2 punctate acute infarctions. One is present along the anterolateral margin of the temporal horn of the right lateral ventricle within the temporal lobe. The other is within the left frontoparietal vertex subcortical white matter. Small acute infarctions in these locations suggest embolic disease from the heart or ascending aorta. There chronic small-vessel ischemic changes affecting the pons. No focal cerebellar insult. Cerebral hemispheres otherwise show chronic small-vessel ischemic change of the white matter with a few old small vessel infarctions in the thalami and basal ganglia. No large vessel territory stroke. No mass, hemorrhage, hydrocephalus or extra-axial collection. Vascular: Major vessels at the base of the brain show flow. Skull and upper cervical spine: Negative Sinuses/Orbits: Clear/normal.  Some dysconjugate gaze. Other: None IMPRESSION: 1. Two punctate acute infarctions, in the right temporal lobe and the left frontoparietal vertex subcortical white matter. 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 2. Chronic small-vessel ischemic changes elsewhere throughout the brain as outlined above. Electronically Signed   By: Paulina Fusi  M.D.   On: 04/27/2023 17:27    Vitals:   04/27/23 2054 04/28/23 0342 04/28/23 0905 04/28/23 1336  BP: 114/75 105/65 103/84 122/69  Pulse: 90 80 95 82  Resp: 17 14 16    Temp: 98.7 F (37.1 C) 97.7 F (36.5 C) 97.8 F (36.6 C) (!) 97.4 F (36.3 C)  TempSrc: Oral Oral Oral Axillary  SpO2: 96% 96% 100% 98%  Weight:      Height:         PHYSICAL EXAM General:  Alert, well-nourished, well-developed patient in no acute distress Psych:  Mood and affect appropriate for situation CV: Regular rate and rhythm on monitor Respiratory:  Regular, unlabored respirations on room air GI: Abdomen soft and nontender   NEURO:  Mental Status: AA&Ox3, patient is able to give clear and coherent history Speech/Language: speech is without dysarthria or aphasia.  Naming, repetition, fluency, and comprehension intact.  Cranial Nerves:  II: PERRL. Visual fields full.  III, IV, VI: EOMI, except unable to abduct left eye. Eyelids elevate symmetrically.  V: Sensation is intact to light touch and symmetrical to face.  VII: Face is symmetrical resting and smiling VIII: hearing intact to voice. IX, X: Palate elevates symmetrically. Phonation is normal.  NW:GNFAOZHY shrug 5/5. XII: tongue is midline without fasciculations. Motor: 5/5 strength to all muscle groups tested (difficult to assess leg strength due to s/p hip surgery). Resting tremor right hand Tone: is normal and bulk is normal Sensation- Intact to light touch bilaterally. Extinction absent to light touch to DSS.   Coordination: FTN intact bilaterally, HKS: no ataxia in BLE.No drift.  Gait- deferred   ASSESSMENT/PLAN  Unclear is patient is deviated from his baseline, per patient he is at about his baseline and no one in room to confirm and does not live with anyone. Ddx includes B12 deficiency, encephalopathy secondary to medical condition such as infection (elevated WBC), CVA. Will contact collateral tomorrow to get a baseline.   Acute  Ischemic Infarct:  small R medial temporal lobe and L frontoparietal vertex subcortical white matter infarcts, etiology unclear, hyperpofusion from surgery vs. 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  2D Echo EF 55-60% LE venous doppler right LE extensive DVT TCD bubble study negative for PFO May consider loop recorder to rule out afib once off anticoagulation in the future LDL 61 HgbA1c 5.4 UDS positive for THC VTE prophylaxis - heparin IV No antithrombotic prior to admission, was on ASA 325 and now on heparin IV Therapy recommendations:  SNF Disposition:  pending  LE DVT LE venous doppler right LE extensive DVT Likely related to post right hip surgery TCD bubble study negative for PFO On heparin IV now Need to transition to DOAC once appropriate   B12 Deficiency Level 165 Start cyanocobalamin 1000 mcg once daily  Leukocytosis  10.0 --> 12.3 today Infection could be causing metabolic encephalopathy  UA (negative) and CXR (negative) Could be related to current DVT Continue to moniter and trend  Hypertension Home meds:  lisinopril-hydrochlorothiazide 20-25 Stable on the low end Avoid low BP Blood Pressure long term Goal normotensive   Hyperlipidemia  Home meds none LDL 61, goal < 70 No high intensity of statin due to LDL at goal  Other Stroke Risk Factors ETOH use, alcohol level , advised to drink no more than 2 drink(s) a day Positive UDS THC - advise to not use cannabis, especially smoking  Other Active Problems Hyponatremia - Na 129 Left CN VI palsy - chronic   Hospital day # 11  ATTENDING NOTE: I reviewed above note and agree with the assessment and plan. Pt was seen and examined.   No family at bedside.  Patient  lying in bed, lethargic but awake alert and fully orientated.  Intact naming and repetition, no apraxia, however decreased concentration and attention incorrect on backward spelling WORLD.  Chronic left CN VI nerve palsy.  4/5 bilateral upper extremity, left lower extremity 3/5, right lower extremity 2/5.  Right hand resting tremor.  LE venous Doppler showed right lower extremity extensive DVT, likely due to post surgery of the right hip.  However, TCD bubble study negative for PFO.  Currently on heparin IV, may consider loop recorder once patient off anticoagulation.  Will transition to DOAC once appropriate.   Patient stated that he lives in a hotel, not exactly sure about his cognitive baseline, however, currently no significant cognitive impairment.  B12 low, on supplement, mild leukocytosis likely related to DVT.  PT and OT recommend SNF.  For detailed assessment and plan, please refer to above/below as I have made changes wherever appropriate.   Marvel Plan, MD PhD Stroke Neurology 04/28/2023 3:38 PM  I spent additional 30 inpatient minutes face-to-face time with the patient, more than 50% of which was spent in counseling and coordination of care, reviewing test results, images and medication, and discussing the diagnosis, treatment plan and potential prognosis. This patient's care requiresreview of multiple databases, neurological assessment, discussion with attending physician, other specialists and medical decision making of high complexity.  I discussed with Dr. Nelson Chimes.       To contact Stroke Continuity provider, please refer to [email or web address]. After hours, contact General Neurology

## 2023-04-28 NOTE — Plan of Care (Signed)
  Problem: Education: Goal: Knowledge of General Education information will improve Description: Including pain rating scale, medication(s)/side effects and non-pharmacologic comfort measures Outcome: Progressing   Problem: Clinical Measurements: Goal: Respiratory complications will improve Outcome: Progressing Goal: Cardiovascular complication will be avoided Outcome: Progressing   Problem: Nutrition: Goal: Adequate nutrition will be maintained Outcome: Progressing   Problem: Coping: Goal: Level of anxiety will decrease Outcome: Progressing   Problem: Elimination: Goal: Will not experience complications related to urinary retention Outcome: Progressing   

## 2023-04-29 ENCOUNTER — Other Ambulatory Visit (HOSPITAL_COMMUNITY): Payer: No Typology Code available for payment source

## 2023-04-29 DIAGNOSIS — R4189 Other symptoms and signs involving cognitive functions and awareness: Secondary | ICD-10-CM | POA: Diagnosis not present

## 2023-04-29 DIAGNOSIS — I639 Cerebral infarction, unspecified: Secondary | ICD-10-CM | POA: Diagnosis not present

## 2023-04-29 DIAGNOSIS — S72001D Fracture of unspecified part of neck of right femur, subsequent encounter for closed fracture with routine healing: Secondary | ICD-10-CM | POA: Diagnosis not present

## 2023-04-29 DIAGNOSIS — S72001A Fracture of unspecified part of neck of right femur, initial encounter for closed fracture: Secondary | ICD-10-CM | POA: Diagnosis not present

## 2023-04-29 LAB — HEPARIN LEVEL (UNFRACTIONATED): Heparin Unfractionated: <0.1 IU/mL — ABNORMAL LOW (ref 0.30–0.70)

## 2023-04-29 MED ORDER — SODIUM CHLORIDE 0.9 % IV SOLN: INTRAVENOUS | Status: AC

## 2023-04-29 MED ORDER — APIXABAN 5 MG PO TABS
5.0000 mg | ORAL_TABLET | Freq: Two times a day (BID) | ORAL | Status: DC
  Administered 2023-04-29 – 2023-05-11 (×26): 5 mg via ORAL
  Filled 2023-04-29 (×26): qty 1

## 2023-04-29 NOTE — Progress Notes (Addendum)
ANTICOAGULATION CONSULT NOTE  Pharmacy Consult for Heparin Indication: DVT  No Known Allergies  Patient Measurements: Height: 5\' 9"  (175.3 cm) Weight: 71.7 kg (158 lb 1.1 oz) IBW/kg (Calculated) : 70.7 Heparin Dosing Weight: total weight  Vital Signs: Temp: 98.3 F (36.8 C) (08/05 0754) Temp Source: Oral (08/05 0300) BP: 134/74 (08/05 0754) Pulse Rate: 81 (08/05 0754)  Labs: Recent Labs    04/28/23 0325 04/28/23 2249 04/29/23 0656 04/29/23 0823  HGB 9.8*  --  9.5*  --   HCT 29.6*  --  28.6*  --   PLT 339  --  382  --   HEPARINUNFRC  --  0.14*  --  <0.10*  CREATININE 0.86  --  1.05  --     Estimated Creatinine Clearance: 72 mL/min (by C-G formula based on SCr of 1.05 mg/dL).   Medical History: Past Medical History:  Diagnosis Date   Hyperlipidemia    Hypertension     Assessment: 62 yo male who sustained a R hip fracture s/p IM nail 7/25. While awaiting placement, MRI showed acute CVA.  Now, doppler 8/04 showed acute DVT in RLE. Pharmacy consulted to dose IV heparin for VTE treatment. Confirmed with RN, no issues or interruptions overnight with heparin infusion, currently running with no issues. No signs of bleeding noted. CBC stable.  Goal of Therapy:  Heparin level 0.3-0.7 units/ml - verified goal range with Dr. Nelson Chimes Monitor platelets by anticoagulation protocol: Yes   Plan:  Increase heparin infusion at 1450 units/hr Check heparin level in 6 hours and daily while on heparin Continue to monitor H&H and platelets  Addendum: Neurology cleared patient to transition to DOAC. Will start apixaban 5 mg twice daily. Will forego the higher loading dose due to recent stroke.   Thank you for allowing pharmacy to be a part of this patient's care.  Thelma Barge, PharmD Clinical Pharmacist

## 2023-04-29 NOTE — Progress Notes (Addendum)
STROKE TEAM PROGRESS NOTE   BRIEF HPI Scott Pruitt is a 62 y.o. male with PMH significant for HTN, HLD, admitted with R hip fracture s/p repair. He was noted to have concern for cognitive impairment for which he had an MRI Brain which was notable for 2 small punctate infarcts in R temporal lobe and left frontoparietal white matter. MRI also notable for chronic small vessel disease.    SIGNIFICANT HOSPITAL EVENTS 7/25 Hip Surgery (R intertrochanteric fracture)   INTERIM HISTORY/SUBJECTIVE Today the patient is more responsive than previous days. He reports no concerns or changes since yesterday.   Collateral [name], [phone]): She reports that the patient is her "baby daddy." Reports significant concern around the patients mental status and that he has had rapid cognitive decline since 2022, including losing his job secondary to these difficulties. She reports his memory has been terrible, and he furthermore has had significant functional decline with his ability to walk and drive a car. She have been told by an unknown provider in the past that they had concern for the patient having vascular dementia.  She also reports that the tremors began recently.  He has also had speech difficulties per [name]. She has been trying to get him evaluated by neurology for at least the last 6 months due to his significant decline, and she reports that they have been unsuccessful in getting a neurologic assessment. She reports that the patient has a PCP. Told [name] that we would get the patient an outpatient appointment with neurology.   OBJECTIVE  CBC    Component Value Date/Time   WBC 10.3 04/29/2023 0656   RBC 3.27 (L) 04/29/2023 0656   HGB 9.5 (L) 04/29/2023 0656   HGB 15.7 09/12/2018 1103   HCT 28.6 (L) 04/29/2023 0656   HCT 45.9 09/12/2018 1103   PLT 382 04/29/2023 0656   PLT 274 09/12/2018 1103   MCV 87.5 04/29/2023 0656   MCV 90 09/12/2018 1103   MCH 29.1 04/29/2023 0656   MCHC 33.2  04/29/2023 0656   RDW 13.1 04/29/2023 0656   RDW 13.0 09/12/2018 1103   LYMPHSABS 1.0 04/18/2023 0043   MONOABS 1.2 (H) 04/18/2023 0043   EOSABS 0.0 04/18/2023 0043   BASOSABS 0.0 04/18/2023 0043    BMET    Component Value Date/Time   NA 129 (L) 04/29/2023 0656   NA 141 09/12/2018 1103   K 3.9 04/29/2023 0656   CL 96 (L) 04/29/2023 0656   CO2 26 04/29/2023 0656   GLUCOSE 96 04/29/2023 0656   BUN 20 04/29/2023 0656   BUN 11 09/12/2018 1103   CREATININE 1.05 04/29/2023 0656   CREATININE 0.99 12/29/2015 0815   CALCIUM 8.2 (L) 04/29/2023 0656   GFRNONAA >60 04/29/2023 0656    IMAGING past 24 hours VAS [place] TRANSCRANIAL DOPPLER W BUBBLES  Result Date: 04/28/2023  Transcranial Doppler with Bubble Patient Name:  Scott Pruitt  Date of Exam:   04/28/2023 Medical Rec #: [id number]     Accession #:    [id number] Date of Birth: [birth year]     Patient Gender: M Patient Age:   60 years Exam Location:  [facility] Procedure:      VAS [place] TRANSCRANIAL DOPPLER W BUBBLES Referring Phys: Scheryl Marten  --------------------------------------------------------------------------------  Indications: Stroke. History: RLE DVT s/p hip surgery/repair. Limitations: limited valsalva due to patient condition Comparison Study: No previous exams Performing Technologist: Jody Hill RVT, RDMS  Examination Guidelines: A complete evaluation includes B-mode imaging, spectral Doppler, color Doppler, and power  Doppler as needed of all accessible portions of each vessel. Bilateral testing is considered an integral part of a complete examination. Limited examinations for reoccurring indications may be performed as noted.  Summary: No HITS at rest or during Valsalva. Negative transcranial Doppler Bubble study with no evidence of right to left intracardiac communication.  A vascular evaluation was performed. The right middle cerebral artery was studied. An IV was inserted into the patient's right forearm. Verbal informed consent was  obtained.  *See table(s) above for TCD measurements and observations.    Preliminary     Vitals:   04/29/23 0114 04/29/23 0300 04/29/23 0754 04/29/23 1122  BP: 125/82 127/77 134/74 118/74  Pulse: 83 83 81 87  Resp:   16 16  Temp: 98.4 F (36.9 C) 98.3 F (36.8 C) 98.3 F (36.8 C) 98 F (36.7 C)  TempSrc: Oral Oral  Oral  SpO2: 98% 96% 97% 98%  Weight:      Height:         PHYSICAL EXAM General:  Alert, well-nourished, well-developed patient in no acute distress Psych:  Mood and affect appropriate for situation CV: Regular rate and rhythm on monitor Respiratory:  Regular, unlabored respirations on room air GI: Abdomen soft and nontender   NEURO:  Mental Status: AA&Ox3, patient is able to give clear and coherent history Speech/Language: speech is without dysarthria or aphasia.  Naming, repetition, fluency, and comprehension intact.  Cranial Nerves:  II: PERRL. Visual fields full.  III, IV, VI: EOMI, except unable to abduct left eye. Eyelids elevate symmetrically.  V: Sensation is intact to light touch and symmetrical to face.  VII: Face is symmetrical resting and smiling VIII: hearing intact to voice. IX, X: Palate elevates symmetrically. Phonation is normal.  ZO:[id number] shrug 5/5. XII: tongue is midline without fasciculations. Motor: 5/5 strength in upper extremities and ankles dorsi/plantar-flexion (difficult to assess leg strength due to s/p hip surgery). Resting tremor right hand diminished today Tone: is normal and bulk is normal Sensation- Intact to light touch bilaterally. Extinction absent to light touch to DSS.   Coordination: FTN intact bilaterally, HKS: no ataxia in BLE.No drift.  Gait- deferred   ASSESSMENT/PLAN His memory was intact as he was describing aspects of his hospital course clearly. No motor deficits present, although difficult to assess R lower extremity. Collateral contact voiced concerns for cognitive decline that we will get evaluated  outpatient; patient has ambulatory neurology referral for cognitive impairment already.   Acute Ischemic Infarct:  small R medial temporal lobe and L frontoparietal vertex subcortical white matter infarcts, etiology unclear, hyperpofusion from surgery vs. 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  2D Echo EF 55-60% LE venous doppler right LE extensive DVT TCD bubble study negative for PFO May consider loop recorder to rule out afib once off anticoagulation in the future LDL 61 HgbA1c 5.4 UDS positive for THC VTE prophylaxis - heparin IV No antithrombotic prior to admission, now on heparin IV transitioned to eliquis Therapy recommendations:  SNF Disposition:  pending  LE DVT LE venous doppler right LE extensive DVT Likely related to post right hip surgery TCD bubble study negative for PFO now on heparin IV transitioned to eliquis  B12 Deficiency Level 165 Continue cyanocobalamin 1000 mcg once daily  Leukocytosis (resolving) Down  trending 12.3-->10.3 today Infection could be causing metabolic encephalopathy  UA (negative) and CXR (negative) Could be related to current DVT Continue to moniter and trend  Hypertension Home meds:  lisinopril-hydrochlorothiazide 20-25 Stable now Avoid low BP Blood Pressure long term Goal normotensive   Hyperlipidemia  Home meds none LDL 61, goal < 70 No high intensity of statin due to LDL at goal  Other Stroke Risk Factors ETOH use, alcohol level , advised to drink no more than 2 drink(s) a day Positive UDS THC - advise to not use cannabis, especially smoking  Other Active Problems Hyponatremia - Na 129 (8/5 unchanged) Left CN VI palsy - chronic  Concern for thiamine deficiency -- continue high  dose thiamine Cognitive decline since 2022 per family, but no evaluation yet  Hospital day # 12  ATTENDING NOTE: I reviewed above note and agree with the assessment and plan. Pt was seen and examined.   No family at bedside.  Patient lying in bed, lethargic but awake alert and fully orientated. Neuro unchanged, on heparin IV, will switch to eliquis today. Leukocytosis improving. Continue B12 and avoid low BP. PT and OT recommend SNF.   For detailed assessment and plan, please refer to above/below as I have made changes wherever appropriate.   Neurology will sign off. Please call with questions. Pt will follow up with stroke clinic NP at [facility] in about 4 weeks. Thanks for the consult.   Marvel Plan, MD PhD Stroke Neurology 04/29/2023 3:12 PM        To contact Stroke Continuity provider, please refer to [email or web address]. After hours, contact General Neurology

## 2023-04-29 NOTE — Plan of Care (Signed)
  Problem: Ischemic Stroke/TIA Tissue Perfusion: Goal: Complications of ischemic stroke/TIA will be minimized Outcome: Progressing   Problem: Coping: Goal: Will verbalize positive feelings about self Outcome: Progressing Goal: Will identify appropriate support needs Outcome: Progressing   Problem: Nutrition: Goal: Risk of aspiration will decrease Outcome: Progressing Goal: Dietary intake will improve Outcome: Progressing

## 2023-04-29 NOTE — Progress Notes (Signed)
PROGRESS NOTE    Luke Boody  ZOX:[id number] DOB: [birth year] DOA: 04/17/2023 PCP: Pcp, No   Brief Narrative:  62 year old with history of HTN, HLD who presented to the hospital after a fall sustaining a right hip fracture.  Patient underwent IM nail of the right hip by Dr. Susa Simmonds on 7/25 thereafter PT OT recommended SNF.  While awaiting placement patient did have some cognitive decline prompting to obtain MRI of the brain which ended up showing acute CVA.  Neurology team was consulted.  Upon further workup patient was noted to have right lower extremity extensive DVT therefore started on anticoagulation.  Also had transcranial Dopplers with bubble study which was negative.   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  Neurology team was consulted.  CTA head and neck does show some vertebral stenosis. - A1c 5.4, LDL 61 - Now on Eliquis - PT/OT-SNF - Started high-dose thiamine -Echo with preserved ejection fraction of 55 to 60%  Right lower extremity acute DVT Echocardiogram overall appears to be stable with preserved EF.  Orthopedic, Dr. Susa Simmonds notified.  Patient is currently on anticoagulation  Closed right hip fracture status post IM nail 7/25 ORIF by Ortho Dr. Susa Simmonds 7/25.  Follow-up outpatient recommended in around 2 weeks Initial recommendations was to be on aspirin full dose twice daily for DVT prophylaxis   Hypokalemia/hyponatremia As needed repletion Dehydration, IV fluids   Normocytic anemia Hemoglobin overall stable around 9.8 Received a dose of  IV iron   Vitamin D deficiency Vitamin D and calcium supplements   Transaminitis Resolved.  Denies alcohol use.  Viral serologies are negative   Hyperlipidemia Not on statin at present   Essential hypertension Discontinue antihypertensives for now for permissive hypertension.  IV as needed.  3 mm pulmonary nodule - Follow-up outpatient   DVT prophylaxis: Lovenox Code Status: Full code Family Communication:   Status is: Inpatient Remains inpatient appropriate because: Transition patient to SNF for next 24-48 hours  Pressure Injury 04/22/23 Sacrum Mid Stage 2 -  Partial thickness loss of dermis presenting as a shallow open injury with a red, pink wound bed without slough. blister (Active)  04/22/23 0230  Location: Sacrum  Location Orientation: Mid  Staging: Stage 2 -  Partial thickness loss of dermis presenting as a shallow open injury with a red, pink wound bed without slough.  Wound Description (Comments): blister  Present on Admission: No     Diet Orders (From admission, onward)     Start     Ordered   04/18/23 1731  Diet regular Fluid consistency: Thin  Diet effective now       Question:  Fluid consistency:  Answer:  Thin   04/18/23 1731            Subjective: No complaints doing okay  Examination:  Constitutional: Not in acute distress Respiratory: Clear to auscultation bilaterally Cardiovascular: Normal sinus rhythm, no rubs Abdomen: Nontender nondistended good bowel sounds Musculoskeletal: No edema noted Skin: No rashes seen Neurologic: CN 2-12 grossly intact.  And nonfocal Psychiatric: Normal judgment and insight.  Alert and oriented x 3. Normal mood.  Objective: Vitals:   04/28/23 2332 04/29/23 0114 04/29/23 0300 04/29/23 0754  BP: 115/82 125/82 127/77 134/74  Pulse: 82 83 83 81  Resp:    16  Temp: 98.5 F (36.9 C) 98.4 F (36.9 C) 98.3 F (36.8 C) 98.3 F (36.8 C)  TempSrc: Oral Oral Oral   SpO2: 97% 98% 96% 97%  Weight:      Height:         Intake/Output Summary (Last 24 hours) at 04/29/2023 1111 Last data filed at 04/29/2023 0809 Gross per 24 hour  Intake 1860.79 ml  Output 750 ml  Net 1110.79 ml   Filed Weights   04/23/23 0600 04/24/23 0432 04/26/23 0500  Weight: 72.3 kg 73 kg 71.7 kg    Scheduled Meds:  calcium carbonate  1 tablet Oral Q breakfast   cholecalciferol  1,000 Units Oral Daily   vitamin B-12  1,000 mcg Oral Daily   docusate sodium  100 mg Oral BID   multivitamin with minerals  1 tablet Oral Daily   [START ON 05/06/2023] thiamine (VITAMIN B1) injection  100 mg Intravenous Daily   Continuous Infusions:  sodium chloride 75 mL/hr at 04/29/23 0854   heparin 1,450 Units/hr (04/29/23 1006)   thiamine (VITAMIN B1) injection 500 mg (04/29/23 0500)   Followed by   Melene Muller ON 04/30/2023] thiamine (VITAMIN B1) injection      Nutritional status     Body mass index is 23.34 kg/m.  Data Reviewed:   CBC: Recent Labs  Lab 04/23/23 0243 04/26/23 0515 04/28/23 0325 04/29/23 0656  WBC 9.6 10.0 12.3* 10.3  HGB 11.2* 10.8* 9.8* 9.5*  HCT 33.6* 33.1* 29.6* 28.6*  MCV 88.4 88.5 89.4 87.5  PLT 250 335 339 382   Basic Metabolic Panel: Recent Labs  Lab 04/23/23 0243 04/25/23 0242 04/26/23 0515 04/28/23 0325 04/29/23 0656  NA 132*  --  133* 129* 129*  K 3.9  --  4.3 4.0 3.9  CL 93*  --  93* 95* 96*  CO2 30  --  30 25 26   GLUCOSE 111*  --  99 94 96  BUN 21  --  18 22 20   CREATININE 0.78 0.92 0.77 0.86 1.05  CALCIUM 8.6*  --  8.7* 8.5* 8.2*  MG  --   --   --   --  1.9  PHOS  --   --   --   --  3.6   GFR: Estimated Creatinine Clearance: 72 mL/min (by C-G formula based on SCr of 1.05 mg/dL). Liver Function Tests: No results for input(s): "AST", "ALT", "ALKPHOS", "BILITOT", "PROT", "ALBUMIN" in the last 168 hours. No results for input(s): "LIPASE", "AMYLASE" in the last 168 hours. No results for input(s): "AMMONIA" in the last 168 hours. Coagulation Profile: No results for input(s): "INR",  "PROTIME" in the last 168 hours. Cardiac Enzymes: No results for input(s): "CKTOTAL", "CKMB", "CKMBINDEX", "TROPONINI" in the last 168 hours. BNP (last 3 results) No results for input(s): "PROBNP" in the last 8760 hours. HbA1C: Recent Labs    04/28/23 0325  HGBA1C 5.4   CBG: No results for input(s): "GLUCAP" in the last 168 hours. Lipid Profile: Recent Labs    04/28/23 0325  CHOL 105  HDL 33*  LDLCALC 61  TRIG 55  CHOLHDL 3.2   Thyroid Function Tests: Recent Labs    04/28/23 0325  TSH 1.011   Anemia Panel: Recent Labs    04/28/23 0325  VITAMINB12 165*  FOLATE 6.4  FERRITIN 317  TIBC 227*  IRON 13*  RETICCTPCT 2.0   Sepsis Labs: No results for input(s): "PROCALCITON", "LATICACIDVEN" in the last 168 hours.  No results found for this or any previous visit (from the past 240 hour(s)).       Radiology Studies: VAS [place] TRANSCRANIAL DOPPLER W BUBBLES  Result Date: 04/28/2023  Transcranial Doppler with Bubble Patient Name:  AURORA STEINHAUS  Date of Exam:   04/28/2023 Medical Rec #: [id number]     Accession #:    [id number] Date of Birth: 1961/09/06     Patient Gender: M Patient Age:   49 years Exam Location:  [facility] Procedure:      VAS [place] TRANSCRANIAL DOPPLER W BUBBLES Referring Phys: Scheryl Marten XU --------------------------------------------------------------------------------  Indications: Stroke. History: RLE DVT s/p hip surgery/repair. Limitations: limited valsalva due to patient condition Comparison Study: No previous exams Performing Technologist: Jody Hill RVT, RDMS  Examination Guidelines: A complete evaluation includes B-mode imaging, spectral Doppler, color Doppler, and power Doppler as needed of all accessible portions of each vessel. Bilateral testing is considered an integral part of a complete examination. Limited examinations for reoccurring indications may be performed as noted.  Summary: No HITS at rest or during Valsalva. Negative transcranial Doppler  Bubble study with no evidence of right to left intracardiac communication.  A vascular evaluation was performed. The right middle cerebral artery was studied. An IV was inserted into the patient's right forearm. Verbal informed consent was obtained.  *See table(s) above for TCD measurements and observations.    Preliminary    VAS [place] LOWER EXTREMITY VENOUS (DVT)  Result Date: 04/28/2023  Lower Venous DVT Study Patient Name:  MOSESE GERSTMAN  Date of Exam:   04/28/2023 Medical Rec #: [id number]     Accession #:    [id number] Date of Birth: Jan 23, 1961     Patient Gender: M Patient Age:   22 years Exam Location[facility]  [facility] Procedure:      VAS [place] LOWER EXTREMITY VENOUS (DVT) Referring Phys: Scheryl Marten XU --------------------------------------------------------------------------------  Indications: Stroke.  Risk Factors: Recent fall S/P IM nailing of right hip on 04/18/23. Limitations: Patient positioning. Comparison Study: No previous exams Performing Technologist: Jody Hill RVT, RDMS  Examination Guidelines: A complete evaluation includes B-mode imaging, spectral Doppler, color Doppler, and power Doppler as needed of all accessible portions of each vessel. Bilateral testing is considered an integral part of a complete examination. Limited examinations for reoccurring indications may be performed as noted. The reflux portion of the exam is performed with the patient in reverse Trendelenburg.  +---------+---------------+---------+-----------+----------+--------------+ RIGHT    CompressibilityPhasicitySpontaneityPropertiesThrombus Aging +---------+---------------+---------+-----------+----------+--------------+ CFV      Full           Yes      Yes                                 +---------+---------------+---------+-----------+----------+--------------+ SFJ      Full                                                        +---------+---------------+---------+-----------+----------+--------------+ FV  Prox  None           No       No  Acute          +---------+---------------+---------+-----------+----------+--------------+ FV Mid   None           No       No                   Acute          +---------+---------------+---------+-----------+----------+--------------+ FV DistalNone           No       No                   Acute          +---------+---------------+---------+-----------+----------+--------------+ PFV      Full                                                        +---------+---------------+---------+-----------+----------+--------------+ POP      None           No       No                   Acute          +---------+---------------+---------+-----------+----------+--------------+ PTV      None           No       No                   Acute          +---------+---------------+---------+-----------+----------+--------------+ PERO     None           No       No                   Acute          +---------+---------------+---------+-----------+----------+--------------+   +---------+---------------+---------+-----------+----------+-------------------+ LEFT     CompressibilityPhasicitySpontaneityPropertiesThrombus Aging      +---------+---------------+---------+-----------+----------+-------------------+ CFV      Full           Yes      Yes                                      +---------+---------------+---------+-----------+----------+-------------------+ SFJ      Full                                                             +---------+---------------+---------+-----------+----------+-------------------+ FV Prox  Full           Yes      Yes                                      +---------+---------------+---------+-----------+----------+-------------------+ FV Mid   Full           Yes      Yes                                       +---------+---------------+---------+-----------+----------+-------------------+  FV DistalFull           Yes      Yes                                      +---------+---------------+---------+-----------+----------+-------------------+ PFV      Full                                                             +---------+---------------+---------+-----------+----------+-------------------+ POP      Full           Yes      Yes                                      +---------+---------------+---------+-----------+----------+-------------------+ PTV      Full                                                             +---------+---------------+---------+-----------+----------+-------------------+ PERO                                                  Not well visualized +---------+---------------+---------+-----------+----------+-------------------+     Summary: BILATERAL: -No evidence of popliteal cyst, bilaterally. RIGHT: - Findings consistent with acute deep vein thrombosis involving the right femoral vein, right popliteal vein, right posterior tibial veins, and right peroneal veins.  LEFT: - There is no evidence of deep vein thrombosis in the lower extremity.  *See table(s) above for measurements and observations. Electronically signed by Heath Lark on 04/28/2023 at 5:02:53 PM.    Final    ECHOCARDIOGRAM COMPLETE  Result Date: 04/28/2023    ECHOCARDIOGRAM REPORT   Patient Name:   RASHAAN LOVEN Date of Exam: 04/28/2023 Medical Rec #:  [id number]    Height:       69.0 in Accession #:    [id number]   Weight:       158.1 lb Date of Birth:  [birth year]    BSA:          1.869 m Patient Age:    62 years     BP:           103/84 mmHg Patient Gender: M            HR:           81 bpm. Exam Location:  Inpatient Procedure: 2D Echo, Cardiac Doppler and Color Doppler Indications:    Stroke I63.9  History:        Patient has no prior history of Echocardiogram examinations.                 Risk  Factors:Hypertension, Dyslipidemia and Former Smoker.  Sonographer:    Dondra Prader RVT RCS Referring Phys: [id number] [facility]  Sonographer Comments: Technically challenging study due to limited acoustic windows. Image acquisition challenging  due to patient body habitus. Unable to measure atrial diameter due to body habitus IMPRESSIONS  1. Left ventricular ejection fraction, by estimation, is 55 to 60%. The left ventricle has normal function. The left ventricle has no regional wall motion abnormalities. Left ventricular diastolic parameters were normal.  2. Right ventricular systolic function is normal. The right ventricular size is normal. Tricuspid regurgitation signal is inadequate for assessing PA pressure.  3. The mitral valve is normal in structure. No evidence of mitral valve regurgitation. No evidence of mitral stenosis.  4. The aortic valve is tricuspid. Aortic valve regurgitation is not visualized. No aortic stenosis is present.  5. Aortic dilatation noted. There is borderline dilatation of the aortic root, measuring 37 mm.  6. The inferior vena cava is normal in size with greater than 50% respiratory variability, suggesting right atrial pressure of 3 mmHg. Comparison(s): No prior Echocardiogram. FINDINGS  Left Ventricle: Left ventricular ejection fraction, by estimation, is 55 to 60%. The left ventricle has normal function. The left ventricle has no regional wall motion abnormalities. The left ventricular internal cavity size was normal in size. There is  no left ventricular hypertrophy. Left ventricular diastolic parameters were normal. Right Ventricle: The right ventricular size is normal. No increase in right ventricular wall thickness. Right ventricular systolic function is normal. Tricuspid regurgitation signal is inadequate for assessing PA pressure. Left Atrium: Left atrial size was normal in size. Right Atrium: Right atrial size was normal in size. Pericardium: There is no evidence of pericardial  effusion. Mitral Valve: The mitral valve is normal in structure. No evidence of mitral valve regurgitation. No evidence of mitral valve stenosis. Tricuspid Valve: The tricuspid valve is not well visualized. Tricuspid valve regurgitation is not demonstrated. No evidence of tricuspid stenosis. Aortic Valve: The aortic valve is tricuspid. Aortic valve regurgitation is not visualized. No aortic stenosis is present. Aortic valve mean gradient measures 2.0 mmHg. Aortic valve peak gradient measures 4.2 mmHg. Aortic valve area, by VTI measures 3.40 cm. Pulmonic Valve: The pulmonic valve was not well visualized. Pulmonic valve regurgitation is not visualized. No evidence of pulmonic stenosis. Aorta: Aortic dilatation noted. There is borderline dilatation of the aortic root, measuring 37 mm. Venous: The inferior vena cava is normal in size with greater than 50% respiratory variability, suggesting right atrial pressure of 3 mmHg. IAS/Shunts: No atrial level shunt detected by color flow Doppler.  LEFT VENTRICLE PLAX 2D LVIDd:         4.50 cm   Diastology LVIDs:         3.10 cm   LV e' medial:    7.18 cm/s LV PW:         1.10 cm   LV E/e' medial:  8.3 LV IVS:        1.10 cm   LV e' lateral:   15.60 cm/s LVOT diam:     2.10 cm   LV E/e' lateral: 3.8 LV SV:         55 LV SV Index:   29 LVOT Area:     3.46 cm  RIGHT VENTRICLE             IVC RV S prime:     14.00 cm/s  IVC diam: 1.30 cm TAPSE (M-mode): 1.6 cm LEFT ATRIUM         Index       RIGHT ATRIUM           Index LA diam:    2.40 cm 1.28 cm/m  RA Area:     10.60 cm                                 RA Volume:   21.70 ml  11.61 ml/m  AORTIC VALVE                    PULMONIC VALVE AV Area (Vmax):    3.57 cm     PV Vmax:       0.89 m/s AV Area (Vmean):   3.44 cm     PV Peak grad:  3.2 mmHg AV Area (VTI):     3.40 cm AV Vmax:           102.00 cm/s AV Vmean:          64.400 cm/s AV VTI:            0.162 m AV Peak Grad:      4.2 mmHg AV Mean Grad:      2.0 mmHg LVOT Vmax:          105.00 cm/s LVOT Vmean:        63.900 cm/s LVOT VTI:          0.159 m LVOT/AV VTI ratio: 0.98  AORTA Ao Root diam: 3.70 cm MITRAL VALVE MV Area (PHT): 4.06 cm    SHUNTS MV Decel Time: 187 msec    Systemic VTI:  0.16 m MV E velocity: 59.70 cm/s  Systemic Diam: 2.10 cm MV A velocity: 55.50 cm/s MV E/A ratio:  1.08 Vishnu Priya Mallipeddi Electronically signed by Winfield Rast Mallipeddi Signature Date/Time: 04/28/2023/3:09:07 PM    Final    DG CHEST PORT 1 VIEW  Result Date: 04/28/2023 CLINICAL DATA:  Leukocytosis EXAM: PORTABLE CHEST 1 VIEW COMPARISON:  04/17/2023 FINDINGS: The heart size and mediastinal contours are within normal limits. Both lungs are clear. The visualized skeletal structures are unremarkable. IMPRESSION: No active disease. Electronically Signed   By: Paulina Fusi M.D.   On: 04/28/2023 10:07   CT ANGIO HEAD NECK W WO CM  Result Date: 04/28/2023 CLINICAL DATA:  Follow-up exam for stroke. EXAM: CT ANGIOGRAPHY HEAD AND NECK WITH AND WITHOUT CONTRAST TECHNIQUE: Multidetector CT imaging of the head and neck was performed using the standard protocol during bolus administration of intravenous contrast. Multiplanar CT image reconstructions and MIPs were obtained to evaluate the vascular anatomy. Carotid stenosis measurements (when applicable) are obtained utilizing NASCET criteria, using the distal internal carotid diameter as the denominator. RADIATION DOSE REDUCTION: This exam was performed according to the departmental dose-optimization program which includes automated exposure control, adjustment of the mA and/or kV according to patient size and/or use of iterative reconstruction technique. CONTRAST:  75mL OMNIPAQUE IOHEXOL 350 MG/ML SOLN COMPARISON:  Prior study from earlier the same day. FINDINGS: CT HEAD FINDINGS Brain: Cerebral volume within normal limits. 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 Previously identified  punctate infarcts not visible by CT. No other acute large vessel territory infarct. No intracranial hemorrhage. No mass lesion or midline shift. No hydrocephalus or extra-axial fluid collection. Vascular: No hyperdense vessel. Skull: Small lipoma noted at the scalp vertex. No acute abnormality. Calvarium intact. Sinuses/Orbits: Globes and orbital soft tissues demonstrate no acute abnormality. Paranasal sinuses are largely clear. No mastoid effusion. Other: None. Review of the MIP images confirms the above findings CTA NECK FINDINGS Aortic arch: Visualized arch within  normal limits for caliber. Origin of the great vessels incompletely visualized. Right carotid system: Right common and internal carotid arteries are patent without dissection. Mild atheromatous irregularity about the right carotid bulb without stenosis. Left carotid system: Left common and internal carotid arteries are patent without dissection. Mild atheromatous irregularity about the left carotid bulb without stenosis. Vertebral arteries: Both vertebral arteries arise from the subclavian arteries. No proximal subclavian artery stenosis. Right vertebral artery dominant. Atheromatous plaque at the origin of the left vertebral artery with severe ostial stenosis. Vertebral arteries otherwise patent without stenosis or dissection. Skeleton: No discrete or worrisome osseous lesions. Moderate spondylosis present at C6-7. Other neck: No other acute finding. Upper chest: Mild emphysema. 3 mm left upper lobe pulmonary nodule noted (series 9, image 12). Review of the MIP images confirms the above findings CTA HEAD FINDINGS Anterior circulation: Both internal carotid arteries are patent to the termini without stenosis. A1 segments, anterior to artery complex common anterior cerebral arteries widely patent. No M1 stenosis or occlusion. No proximal MCA branch occlusion or high-grade stenosis. Distal MCA branches perfused and symmetric. Posterior circulation: Both  V4 segments widely patent. Both PICA patent. Basilar widely patent without stenosis. Superior cerebellar arteries patent bilaterally. Right PCA supplied via the basilar as well as a prominent right posterior communicating artery. Fetal type origin of the left PCA. Both PCAs patent without stenosis. Venous sinuses: Patent allowing for timing the contrast bolus. Anatomic variants: As above.  No aneurysm. Review of the MIP images confirms the above findings IMPRESSION: CT HEAD IMPRESSION: 1. Previously identified punctate infarcts not visible by CT. No other acute intracranial abnormality. 2. 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 CTA HEAD AND NECK IMPRESSION: 1. Negative CTA for large vessel occlusion or other emergent finding. 2. 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 3. Otherwise wide patency of the major arterial vasculature of the head and neck. No other hemodynamically significant or correctable stenosis. 4. 3 mm left upper lobe pulmonary nodule, indeterminate. Per Fleischner Society Guidelines, a non-contrast Chest CT at 12 months is optional. If performed and the nodule is stable at 12 months, no further follow-up is recommended. These guidelines do not apply to immunocompromised patients and patients with cancer. Follow up in patients with significant comorbidities as clinically warranted. For lung cancer screening, adhere to Lung-RADS guidelines. Reference: Radiology. 2017; 284(1):228-43. Aortic Atherosclerosis (ICD10-I70.0) and Emphysema (ICD10-J43.9). Electronically Signed   By: Rise Mu M.D.   On: 04/28/2023 03:40   MR BRAIN WO CONTRAST  Result Date: 04/27/2023 CLINICAL DATA:  Tremor.  Memory impairment. EXAM: MRI HEAD WITHOUT CONTRAST TECHNIQUE: Multiplanar, multiecho pulse sequences of the brain and surrounding structures were obtained without intravenous contrast.  COMPARISON:  None Available. FINDINGS: Brain: There are 2 punctate acute infarctions. One is present along the anterolateral margin of the temporal horn of the right lateral ventricle within the temporal lobe. The other is within the left frontoparietal vertex subcortical white matter. Small acute infarctions in these locations suggest embolic disease from the heart or ascending aorta. There chronic small-vessel ischemic changes affecting the pons. No focal cerebellar insult. Cerebral hemispheres otherwise show chronic small-vessel ischemic change of the white matter with a few old small vessel infarctions in the thalami and basal ganglia. No large vessel territory stroke. No mass, hemorrhage, hydrocephalus or extra-axial collection. Vascular: Major vessels at the base of the brain show flow. Skull and upper cervical spine:  Negative Sinuses/Orbits: Clear/normal.  Some dysconjugate gaze. Other: None IMPRESSION: 1. Two punctate acute infarctions, in the right temporal lobe and the left frontoparietal vertex subcortical white matter. 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 2. Chronic small-vessel ischemic changes elsewhere throughout the brain as outlined above. Electronically Signed   By: Paulina Fusi M.D.   On: 04/27/2023 17:27   DG Abd Portable 1V  Result Date: 04/27/2023 CLINICAL DATA:  Foreign body in digestive tract EXAM: PORTABLE ABDOMEN - 1 VIEW SKULL - 2 VIEW COMPARISON:  None Available. FINDINGS: No radiopaque foreign body in the imaged portions of the skull and upper cervical spine. The bowel gas pattern is normal. No radio-opaque calculi or other significant radiographic abnormality are seen. IMPRESSION: No metallic foreign body Electronically Signed   By: Lorenza Cambridge M.D.   On: 04/27/2023 15:18   DG Skull 1-3 Views  Result Date: 04/27/2023 CLINICAL DATA:  Foreign body in digestive tract EXAM: PORTABLE ABDOMEN - 1 VIEW SKULL - 2 VIEW COMPARISON:  None Available. FINDINGS: No  radiopaque foreign body in the imaged portions of the skull and upper cervical spine. The bowel gas pattern is normal. No radio-opaque calculi or other significant radiographic abnormality are seen. IMPRESSION: No metallic foreign body Electronically Signed   By: Lorenza Cambridge M.D.   On: 04/27/2023 15:18           LOS: 12 days   Time spent= 35 mins     Joline Maxcy, MD [facility]  If 7PM-7AM, please contact night-coverage  04/29/2023, 11:11 AM

## 2023-04-29 NOTE — Evaluation (Signed)
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   Healthcare maintenance 03/11/2020   Depression, major, single episode, severe (HCC) 03/11/2020   Essential hypertension 01/22/2016   Hyperlipidemia 01/22/2016   Right-sided low back pain without sciatica 04/17/2015   Past Medical History:  Past Medical History:  Diagnosis Date   Hyperlipidemia    Hypertension    Past Surgical History:  Past Surgical History:  Procedure Laterality Date   FRACTURE SURGERY Left    left leg   INTRAMEDULLARY (IM) NAIL INTERTROCHANTERIC Right 04/18/2023   Procedure: INTRAMEDULLARY (IM) NAIL INTERTROCHANTERIC;  Surgeon: Terance Hart, MD;  Location: [facility] OR;  Service: Orthopedics;  Laterality: Right;   HPI:  Scott Pruitt is a 62 year old male who presented to the hospital after a fall sustaining a right hip fracture.  Patient underwent IM nail of the right hip by Dr. Susa Simmonds on 7/25 thereafter PT OT recommended SNF.  While awaiting placement patient did have some cognitive decline prompting to obtain MRI of the brain which ended up showing acute CVA: "Two punctate acute infarctions, in the right temporal lobe and  the left frontoparietal vertex subcortical white matter."  Neurology team was consulted. Pt with history of HTN, HLD.   Assessment / Plan / Recommendation Clinical Impression  Pt presents with functional cognitive linguistic abilities.  Pt was assessed using the COGNISTAT (see below for additional information) and performed within the average range on all subests except  for word recall which showed a mild impairment.  He does not believe that this represents a departure from baseline.  He does not have concerns with resuming his ADLs. His affect was initially somewhat flat but he maintained excellent eye contact and when speaking about his daughter her became much more emotive.  Pt does not have any further ST needs; SLP will sign off.   COGNISTAT: All subtests are within the average range, except where otherwise specified.  Orientation:  11/12 Attention: 8/8 Comprehension: 5/6 Repetition: 12/12 Naming: 7/8 Construction: not assessed Memory: 7/12, mild impairment Calculations: 4/4 Similarities: 6/8 Judgment: 5/6     SLP Assessment  SLP Recommendation/Assessment: Patient does not need any further Speech Lanaguage Pathology Services SLP Visit Diagnosis: Cognitive communication deficit (R41.841)    Recommendations for follow up therapy are one component of a multi-disciplinary discharge planning process, led by the attending physician.  Recommendations may be updated based on patient status, additional functional criteria and insurance authorization.    Follow Up Recommendations  Follow physician's recommendations for discharge plan and follow up therapies    Assistance Recommended at Discharge  None  Functional Status Assessment Patient has not had a recent decline in their functional status  Frequency and Duration  (N/A)         SLP Evaluation Cognition  Overall Cognitive Status: Within Functional Limits for tasks assessed Arousal/Alertness: Awake/alert Orientation Level: Oriented X4 Year: 2024 Month: August Day of Week: Correct Attention: Focused;Sustained Focused Attention: Appears intact Sustained Attention: Appears intact Memory: Impaired Memory Impairment: Retrieval deficit Awareness: Appears intact Problem Solving: Appears intact Executive Function: Reasoning Reasoning: Appears intact  Comprehension  Auditory  Comprehension Overall Auditory Comprehension: Appears within functional limits for tasks assessed Commands: Within Functional Limits Conversation: Complex Visual Recognition/Discrimination Discrimination: Not tested Reading Comprehension Reading Status: Not tested    Expression Expression Primary Mode of Expression: Verbal Verbal Expression Overall Verbal Expression: Appears within functional limits for tasks assessed Initiation: No impairment Level of Generative/Spontaneous Verbalization: Conversation Repetition: No impairment Naming: No impairment Pragmatics: No impairment Written Expression Written Expression: Not tested   Oral / Motor  Motor Speech Overall Motor Speech: Appears within functional limits for tasks assessed Respiration: Within functional limits Phonation: Normal Resonance: Within functional limits Articulation: Within functional limitis Intelligibility: Intelligible Motor Planning: Witnin functional limits Motor Speech Errors: Not applicable            Kerrie Pleasure, MA, CCC-SLP Acute Rehabilitation Services Office: [phone] 04/29/2023, 12:30 PM

## 2023-04-29 NOTE — Plan of Care (Signed)
  Problem: Clinical Measurements: Goal: Will remain free from infection Outcome: Progressing   Problem: Nutrition: Goal: Adequate nutrition will be maintained Outcome: Progressing   Problem: Coping: Goal: Level of anxiety will decrease Outcome: Progressing   Problem: Elimination: Goal: Will not experience complications related to bowel motility Outcome: Progressing Goal: Will not experience complications related to urinary retention Outcome: Progressing   Problem: Pain Managment: Goal: General experience of comfort will improve Outcome: Progressing   Problem: Safety: Goal: Ability to remain free from injury will improve Outcome: Progressing   Problem: Education: Goal: Knowledge of disease or condition will improve Outcome: Progressing   Problem: Nutrition: Goal: Risk of aspiration will decrease Outcome: Progressing

## 2023-04-29 NOTE — TOC Progression Note (Signed)
Transition of Care [facility]) - Progression Note    Patient Details  Name: Scott Pruitt MRN: [id number] Date of Birth: 17-Apr-1961  [facility]) CM/SW Contact  Erin Sons, [place] Phone Number: 04/29/2023, 11:26 AM  Clinical Narrative:     Charlann Lange, spoke to Becky/admissions, cell: [phone] and informed that she has been unable to review referral. She will review referral and call CSW back with decision.   Expected Discharge Plan: Skilled Nursing Facility Barriers to Discharge: No SNF bed              Social Determinants of Health (SDOH) Interventions SDOH Screenings   Food Insecurity: Food Insecurity Present (04/18/2023)  Housing: Low Risk  (04/18/2023)  Transportation Needs: No Transportation Needs (04/18/2023)  Utilities: Not At Risk (04/28/2023)  Depression (PHQ2-9): Medium Risk (03/11/2020)  Social Connections: Unknown (02/02/2022)   Received from [facility]  Tobacco Use: Medium Risk (04/18/2023)    Readmission Risk Interventions     No data to display

## 2023-04-29 NOTE — Progress Notes (Signed)
Speech Language Pathology Treatment: Dysphagia  Patient Details Name: Scott Pruitt MRN: [id number] DOB: [birth year] Today's Date: 04/29/2023 Time: 4010-2725 SLP Time Calculation (min) (ACUTE ONLY): 5 min  Assessment / Plan / Recommendation Clinical Impression  Pt seen for swallowing reassessment given minimal s/s of aspiration during evaluation 8/4.  Today pt tolerated thin liquid by serial straw sip and regular solid which pt self fed with no clinical s/s of aspiration.  Pt exhibited excellent oral clearance with independent use of liquid wash.  RN reports no difficulty with PO intake.  Pt appears safe to continue current diet.  He has no additional ST needs.  SLP will sign off.  Recommend continuing regular texture diet with thin liquid.   HPI HPI: Scott Pruitt is a 62 year old male who presented to the hospital after a fall sustaining a right hip fracture.  Patient underwent IM nail of the right hip by Dr. Susa Simmonds on 7/25 thereafter PT OT recommended SNF.  While awaiting placement patient did have some cognitive decline prompting to obtain MRI of the brain which ended up showing acute CVA: "Two punctate acute infarctions, in the right temporal lobe and  the left frontoparietal vertex subcortical white matter."  Neurology team was consulted. Pt with history of HTN, HLD.      SLP Plan  All goals met  Patient does not need any further Speech Lanaguage Pathology Services   Recommendations for follow up therapy are one component of a multi-disciplinary discharge planning process, led by the attending physician.  Recommendations may be updated based on patient status, additional functional criteria and insurance authorization.    Recommendations  Medication Administration: Whole meds with liquid Compensations: Slow rate;Small sips/bites                  Oral care BID   None Dysphagia, unspecified (R13.10)     All goals met     Kerrie Pleasure, MA, CCC-SLP Acute Rehabilitation  Services Office: [phone] 04/29/2023, 12:33 PM

## 2023-04-30 ENCOUNTER — Other Ambulatory Visit (HOSPITAL_COMMUNITY): Payer: Self-pay

## 2023-04-30 DIAGNOSIS — S72001G Fracture of unspecified part of neck of right femur, subsequent encounter for closed fracture with delayed healing: Secondary | ICD-10-CM | POA: Diagnosis not present

## 2023-04-30 MED ORDER — TIZANIDINE HCL 2 MG PO CAPS: 2.0000 mg | ORAL_CAPSULE | Freq: Three times a day (TID) | ORAL | 0 refills | Status: AC | PRN

## 2023-04-30 MED ORDER — ADULT MULTIVITAMIN W/MINERALS CH: 1.0000 | ORAL_TABLET | Freq: Every day | ORAL | Status: AC

## 2023-04-30 MED ORDER — THIAMINE HCL 100 MG PO TABS: 100.0000 mg | ORAL_TABLET | Freq: Every day | ORAL | Status: AC

## 2023-04-30 MED ORDER — CYANOCOBALAMIN 1000 MCG PO TABS: 1000.0000 ug | ORAL_TABLET | Freq: Every day | ORAL | Status: AC

## 2023-04-30 MED ORDER — DOCUSATE SODIUM 100 MG PO CAPS: 100.0000 mg | ORAL_CAPSULE | Freq: Two times a day (BID) | ORAL | Status: AC | PRN

## 2023-04-30 MED ORDER — APIXABAN 5 MG PO TABS: 5.0000 mg | ORAL_TABLET | Freq: Two times a day (BID) | ORAL | Status: AC

## 2023-04-30 MED ORDER — OXYCODONE HCL 5 MG PO TABS: 5.0000 mg | ORAL_TABLET | Freq: Four times a day (QID) | ORAL | 0 refills | Status: AC | PRN

## 2023-04-30 NOTE — Progress Notes (Signed)
Physical Therapy Treatment Patient Details Name: Scott Pruitt MRN: [id number] DOB: 01-Sep-1961 Today's Date: 04/30/2023   History of Present Illness 62 y.o. male admitted [facility] on 04/17/2023 with acute right hip fracture after ground-level mechanical fall after presenting from home to [facility] ED complaining of right hip pain. He is s/p nailing of R hip fx 04/18/23. Past medical history significant for essential hypertension, hyperlipidemia. 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 MRI also notable for chronic small vessel disease.    PT Comments  Patient received in bed, he is agreeable to PT session. Requires cues to initiate mobility and encouragement  to do what he is able to do to assist. Patient required mod/max assist to get from supine to sit. Max assist to scoot forward. Left leaning in sitting to un-weight R LE due to reported pain. He declines attempting to stand.  Patient requires continued encouragement to participate. Stating he likes to just lie in the bed. He will continue to benefit from skilled PT to improve independence with mobility.       If plan is discharge home, recommend the following: Two people to help with walking and/or transfers;A lot of help with bathing/dressing/bathroom;Assistance with cooking/housework;Direct supervision/assist for medications management;Assist for transportation;Help with stairs or ramp for entrance   Can travel by private vehicle     No  Equipment Recommendations  Other (comment) (TBD)    Recommendations for Other Services       Precautions / Restrictions Precautions Precautions: Fall Restrictions Weight Bearing Restrictions: No RLE Weight Bearing: Weight bearing as tolerated     Mobility  Bed Mobility Overal bed mobility: Needs Assistance Bed Mobility: Supine to Sit, Sit to  Supine     Supine to sit: Mod assist, HOB elevated Sit to supine: Mod assist   General bed mobility comments: Patient able to initiate movement in and out of bed. Requires assist to raise trunk and position at edge of bed using bed pad to assist with scooting. Needs assist to move R LE in bed.    Transfers                   General transfer comment: Patient declines attempt to stand. Not motivated, states he likes to stay in bed.    Ambulation/Gait               General Gait Details: pt unable   Stairs             Wheelchair Mobility     Tilt Bed    Modified Rankin (Stroke Patients Only)       Balance Overall balance assessment: Needs assistance Sitting-balance support: Feet supported Sitting balance-Leahy Scale: Poor Sitting balance - Comments: forward flexed posture in sitting. Left leaning to unweight R LE due to reported pain. Cues needed for upright posture in sitting and mod A needed to scoot seated on side of bed with poor initiation to assist with movement. Postural control: Left lateral lean                                  Cognition Arousal/Alertness: Awake/alert Behavior During Therapy: Flat affect Overall Cognitive Status: Within Functional Limits for tasks assessed  General Comments: flat affect, decreased motivation, cues/encouragement needed to participate.        Exercises Other Exercises Other Exercises: LAQ in sitting 5x2 with cues    General Comments        Pertinent Vitals/Pain Pain Assessment Pain Assessment: Faces Faces Pain Scale: Hurts little more Pain Location: R hip/leg Pain Descriptors / Indicators: Discomfort, Grimacing, Guarding Pain Intervention(s): Monitored during session, Repositioned    Home Living                          Prior Function            PT Goals (current goals can now be found in the care plan section) Acute  Rehab PT Goals Patient Stated Goal: none stated PT Goal Formulation: Patient unable to participate in goal setting Time For Goal Achievement: 05/03/23 Progress towards PT goals: Not progressing toward goals - comment (decreased initiation/motivation)    Frequency    Min 1X/week      PT Plan Current plan remains appropriate    Co-evaluation              AM-PAC PT "6 Clicks" Mobility   Outcome Measure  Help needed turning from your back to your side while in a flat bed without using bedrails?: A Lot Help needed moving from lying on your back to sitting on the side of a flat bed without using bedrails?: A Lot Help needed moving to and from a bed to a chair (including a wheelchair)?: Total Help needed standing up from a chair using your arms (e.g., wheelchair or bedside chair)?: Total Help needed to walk in hospital room?: Total Help needed climbing 3-5 steps with a railing? : Total 6 Click Score: 8    End of Session   Activity Tolerance: Patient limited by pain;Patient limited by lethargy Patient left: in bed;with nursing/sitter in room Nurse Communication: Mobility status PT Visit Diagnosis: Other abnormalities of gait and mobility (R26.89);Muscle weakness (generalized) (M62.81);Pain Pain - Right/Left: Right Pain - part of body: Hip     Time: 1135-1148 PT Time Calculation (min) (ACUTE ONLY): 13 min  Charges:    $Therapeutic Activity: 8-22 mins PT General Charges $$ ACUTE PT VISIT: 1 Visit                      , PT, [facility] 04/30/23,12:01 PM

## 2023-04-30 NOTE — Plan of Care (Signed)
  Problem: Education: Goal: Knowledge of General Education information will improve Description Including pain rating scale, medication(s)/side effects and non-pharmacologic comfort measures Outcome: Progressing   

## 2023-04-30 NOTE — TOC Progression Note (Addendum)
Transition of Care [facility]) - Progression Note    Patient Details  Name: Scott Pruitt MRN: [id number] Date of Birth: March 02, 1961  Transition of Care [facility]) CM/SW Contact  Lawerance Sabal, RN Phone Number: 04/30/2023, 12:07 PM  Clinical Narrative:     Coralyn Pear out to Becky/admissions, cell: [phone] and she states that she does not have the referral, she provided me a different FAX [phone], and I have faxed clinicals to this number.  14:30 Spoke w Kriste Basque and she states that she will review clinicals and get back to me by the end of the day.   Expected Discharge Plan: Skilled Nursing Facility Barriers to Discharge: No SNF bed  Expected Discharge Plan and Services                                               Social Determinants of Health (SDOH) Interventions SDOH Screenings   Food Insecurity: Food Insecurity Present (04/18/2023)  Housing: Low Risk  (04/18/2023)  Transportation Needs: No Transportation Needs (04/18/2023)  Utilities: Not At Risk (04/28/2023)  Depression (PHQ2-9): Medium Risk (03/11/2020)  Social Connections: Unknown (02/02/2022)   Received from [facility]  Tobacco Use: Medium Risk (04/18/2023)    Readmission Risk Interventions     No data to display

## 2023-04-30 NOTE — Progress Notes (Signed)
PT Cancellation Note  Patient Details Name: Scott Pruitt MRN: [id number] DOB: 27-Apr-1961   Cancelled Treatment:    Reason Eval/Treat Not Completed: Patient declined, no reason specified. Patient states he might be going to rehab today (no mention of that in chart). Declines PT at this time, will check back later as time allows.    , 04/30/2023, 10:44 AM

## 2023-04-30 NOTE — Progress Notes (Signed)
PROGRESS NOTE    Scott Pruitt  UXL:[id number] DOB: 07-28-1961 DOA: 04/17/2023 PCP: Pcp, No   Brief Narrative:  62 year old with history of HTN, HLD who presented to the hospital after a fall sustaining a right hip fracture.  Patient underwent IM nail of the right hip by Dr. Susa Simmonds on 7/25 thereafter PT OT recommended SNF.  While awaiting placement patient did have some cognitive decline prompting to obtain MRI of the brain which ended up showing acute CVA.  Neurology team was consulted.  Upon further workup patient was noted to have right lower extremity extensive DVT therefore started on anticoagulation.  Also had transcranial Dopplers with bubble study which was negative.  Patient was eventually transitioned to oral Eliquis.  Currently awaiting SNF placement.   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  Neurology team was consulted.  CTA head and neck does show some vertebral stenosis. - A1c 5.4, LDL 61 - Now on Eliquis - PT/OT-SNF - Started high-dose thiamine -Echo with preserved ejection fraction of 55 to 60%  Right lower extremity acute DVT Echocardiogram overall appears to be stable with preserved EF.  Orthopedic, Dr. Susa Simmonds notified.  Now on Eliquis  Closed right hip fracture status post IM nail 7/25 ORIF by Ortho Dr. Susa Simmonds 7/25.  Follow-up outpatient recommended in around 2 weeks Initial recommendations was to be on aspirin full dose twice daily for DVT prophylaxis   Hypokalemia/hyponatremia Stable.  Replete as needed   Normocytic  anemia Hemoglobin overall stable around 9.8 Received a dose of IV iron   Vitamin D deficiency Vitamin D and calcium supplements   Transaminitis Resolved.  Denies alcohol use.  Viral serologies are negative   Hyperlipidemia Not on statin at present   Essential hypertension Discontinue antihypertensives for now for permissive hypertension.  IV as needed.  3 mm pulmonary nodule - Follow-up outpatient   DVT prophylaxis: Lovenox Code Status: Full code Family Communication:   Status is: Inpatient Remains inpatient appropriate because: Pending placement to SNF  Pressure Injury 04/22/23 Sacrum Mid Stage 2 -  Partial thickness loss of dermis presenting as a shallow open injury with a red, pink wound bed without slough. blister (Active)  04/22/23 0230  Location: Sacrum  Location Orientation: Mid  Staging: Stage 2 -  Partial thickness loss of dermis presenting as a shallow open injury with a red, pink wound bed without slough.  Wound Description (Comments): blister  Present on Admission: No     Diet Orders (From admission, onward)     Start     Ordered   04/18/23 1731  Diet regular Fluid consistency: Thin  Diet effective now       Question:  Fluid consistency:  Answer:  Thin   04/18/23 1731            Subjective: No complaints  Examination:  Constitutional: Not in acute distress Respiratory: Clear to auscultation bilaterally Cardiovascular: Normal sinus rhythm, no rubs Abdomen: Nontender nondistended good bowel sounds Musculoskeletal: No edema noted Skin: No rashes seen Neurologic: CN 2-12 grossly intact.  And nonfocal Psychiatric:  Normal judgment and insight. Alert and oriented x 3. Normal mood.  Objective: Vitals:   04/29/23 2317 04/30/23 0438 04/30/23 0621 04/30/23 0741  BP: (!) 146/89  137/72 129/77  Pulse: 86  72 74  Resp: 18  17 16   Temp: 98.3 F (36.8 C)  98.2 F (36.8 C) 98 F (36.7 C)  TempSrc: Oral   Oral  SpO2: 96%  98% 97%  Weight:  74.9 kg     Height:        Intake/Output Summary (Last 24 hours) at 04/30/2023 1118 Last data filed at 04/30/2023 0930 Gross per 24 hour  Intake 2598.03 ml  Output 850 ml  Net 1748.03 ml   Filed Weights   04/24/23 0432 04/26/23 0500 04/30/23 0438  Weight: 73 kg 71.7 kg 74.9 kg    Scheduled Meds:  apixaban  5 mg Oral BID   calcium carbonate  1 tablet Oral Q breakfast   cholecalciferol  1,000 Units Oral Daily   vitamin B-12  1,000 mcg Oral Daily   docusate sodium  100 mg Oral BID   multivitamin with minerals  1 tablet Oral Daily   [START ON 05/06/2023] thiamine (VITAMIN B1) injection  100 mg Intravenous Daily   Continuous Infusions:  thiamine (VITAMIN B1) injection      Nutritional status     Body mass index is 24.38 kg/m.  Data Reviewed:   CBC: Recent Labs  Lab 04/26/23 0515 04/28/23 0325 04/29/23 0656 04/30/23 0202  WBC 10.0 12.3* 10.3 9.0  HGB 10.8* 9.8* 9.5* 9.3*  HCT 33.1* 29.6* 28.6* 28.2*  MCV 88.5 89.4 87.5 87.3  PLT 335 339 382 385   Basic Metabolic Panel: Recent Labs  Lab 04/25/23 0242 04/26/23 0515 04/28/23 0325 04/29/23 0656 04/30/23 0202  NA  --  133* 129* 129* 131*  K  --  4.3 4.0 3.9 3.8  CL  --  93* 95* 96* 99  CO2  --  30 25 26 22   GLUCOSE  --  99 94 96 89  BUN  --  18 22 20 18   CREATININE 0.92 0.77 0.86 1.05 0.79  CALCIUM  --  8.7* 8.5* 8.2* 8.2*  MG  --   --   --  1.9 2.0  PHOS  --   --   --  3.6  --    GFR: Estimated Creatinine Clearance: 94.5 mL/min (by C-G formula based on SCr of 0.79 mg/dL). Liver Function Tests: No results for input(s): "AST", "ALT", "ALKPHOS", "BILITOT", "PROT", "ALBUMIN" in the last 168 hours. No results for input(s): "LIPASE", "AMYLASE" in the last 168 hours. No results for input(s): "AMMONIA" in the last 168 hours. Coagulation Profile: No results for input(s): "INR", "PROTIME" in the last 168 hours. Cardiac Enzymes: No results for input(s): "CKTOTAL", "CKMB", "CKMBINDEX", "TROPONINI" in the last 168 hours. BNP  (last 3 results) No results for input(s): "PROBNP" in the last 8760 hours. HbA1C: Recent Labs    04/28/23 0325  HGBA1C 5.4   CBG: No results for input(s): "GLUCAP" in the last 168 hours. Lipid Profile: Recent Labs    04/28/23 0325  CHOL 105  HDL 33*  LDLCALC 61  TRIG 55  CHOLHDL 3.2   Thyroid Function Tests: Recent Labs    04/28/23 0325  TSH 1.011   Anemia Panel: Recent Labs    04/28/23 0325  VITAMINB12 165*  FOLATE 6.4  FERRITIN 317  TIBC 227*  IRON 13*  RETICCTPCT 2.0   Sepsis Labs: No results for input(s): "PROCALCITON", "  LATICACIDVEN" in the last 168 hours.  No results found for this or any previous visit (from the past 240 hour(s)).       Radiology Studies: VAS [place] TRANSCRANIAL DOPPLER W BUBBLES  Result Date: 04/28/2023  Transcranial Doppler with Bubble Patient Name:  Scott Pruitt  Date of Exam:   04/28/2023 Medical Rec #: [id number]     Accession #:    [id number] Date of Birth: June 30, 1961     Patient Gender: M Patient Age:   85 years Exam Location:  [facility] Procedure:      VAS [place] TRANSCRANIAL DOPPLER W BUBBLES Referring Phys: Scheryl Marten XU --------------------------------------------------------------------------------  Indications: Stroke. History: RLE DVT s/p hip surgery/repair. Limitations: limited valsalva due to patient condition Comparison Study: No previous exams Performing Technologist: Jody Hill RVT, RDMS  Examination Guidelines: A complete evaluation includes B-mode imaging, spectral Doppler, color Doppler, and power Doppler as needed of all accessible portions of each vessel. Bilateral testing is considered an integral part of a complete examination. Limited examinations for reoccurring indications may be performed as noted.  Summary: No HITS at rest or during Valsalva. Negative transcranial Doppler Bubble study with no evidence of right to left intracardiac communication.  A vascular evaluation was performed. The right middle cerebral artery was  studied. An IV was inserted into the patient's right forearm. Verbal informed consent was obtained.  *See table(s) above for TCD measurements and observations.    Preliminary    VAS [place] LOWER EXTREMITY VENOUS (DVT)  Result Date: 04/28/2023  Lower Venous DVT Study Patient Name:  BRAXSON BRANDSMA  Date of Exam:   04/28/2023 Medical Rec #: [id number]     Accession #:    [id number] Date of Birth: 16-Jun-1961     Patient Gender: M Patient Age:   52 years Exam Location:  [facility] Procedure:      VAS [place] LOWER EXTREMITY VENOUS (DVT) Referring Phys: Scheryl Marten XU --------------------------------------------------------------------------------  Indications: Stroke.  Risk Factors: Recent fall S/P IM nailing of right hip on 04/18/23. Limitations: Patient positioning. Comparison Study: No previous exams Performing Technologist: Jody Hill RVT, RDMS  Examination Guidelines: A complete evaluation includes B-mode imaging, spectral Doppler, color Doppler, and power Doppler as needed of all accessible portions of each vessel. Bilateral testing is considered an integral part of a complete examination. Limited examinations for reoccurring indications may be performed as noted. The reflux portion of the exam is performed with the patient in reverse Trendelenburg.  +---------+---------------+---------+-----------+----------+--------------+ RIGHT    CompressibilityPhasicitySpontaneityPropertiesThrombus Aging +---------+---------------+---------+-----------+----------+--------------+ CFV      Full           Yes      Yes                                 +---------+---------------+---------+-----------+----------+--------------+ SFJ      Full                                                        +---------+---------------+---------+-----------+----------+--------------+ FV Prox  None           No       No                   Acute          +---------+---------------+---------+-----------+----------+--------------+  FV Mid   None           No       No                   Acute          +---------+---------------+---------+-----------+----------+--------------+ FV DistalNone           No       No                   Acute          +---------+---------------+---------+-----------+----------+--------------+ PFV      Full                                                        +---------+---------------+---------+-----------+----------+--------------+ POP      None           No       No                   Acute          +---------+---------------+---------+-----------+----------+--------------+ PTV      None           No       No                   Acute          +---------+---------------+---------+-----------+----------+--------------+ PERO     None           No       No                   Acute          +---------+---------------+---------+-----------+----------+--------------+   +---------+---------------+---------+-----------+----------+-------------------+ LEFT     CompressibilityPhasicitySpontaneityPropertiesThrombus Aging      +---------+---------------+---------+-----------+----------+-------------------+ CFV      Full           Yes      Yes                                      +---------+---------------+---------+-----------+----------+-------------------+ SFJ      Full                                                             +---------+---------------+---------+-----------+----------+-------------------+ FV Prox  Full           Yes      Yes                                      +---------+---------------+---------+-----------+----------+-------------------+ FV Mid   Full           Yes      Yes                                      +---------+---------------+---------+-----------+----------+-------------------+ FV DistalFull  Yes      Yes                                       +---------+---------------+---------+-----------+----------+-------------------+ PFV      Full                                                             +---------+---------------+---------+-----------+----------+-------------------+ POP      Full           Yes      Yes                                      +---------+---------------+---------+-----------+----------+-------------------+ PTV      Full                                                             +---------+---------------+---------+-----------+----------+-------------------+ PERO                                                  Not well visualized +---------+---------------+---------+-----------+----------+-------------------+     Summary: BILATERAL: -No evidence of popliteal cyst, bilaterally. RIGHT: - Findings consistent with acute deep vein thrombosis involving the right femoral vein, right popliteal vein, right posterior tibial veins, and right peroneal veins.  LEFT: - There is no evidence of deep vein thrombosis in the lower extremity.  *See table(s) above for measurements and observations. Electronically signed by Heath Lark on 04/28/2023 at 5:02:53 PM.    Final    ECHOCARDIOGRAM COMPLETE  Result Date: 04/28/2023    ECHOCARDIOGRAM REPORT   Patient Name:   Scott Pruitt Date of Exam: 04/28/2023 Medical Rec #:  [id number]    Height:       69.0 in Accession #:    [id number]   Weight:       158.1 lb Date of Birth:  July 31, 1961    BSA:          1.869 m Patient Age:    62 years     BP:           103/84 mmHg Patient Gender: M            HR:           81 bpm. Exam Location:  Inpatient Procedure: 2D Echo, Cardiac Doppler and Color Doppler Indications:    Stroke I63.9  History:        Patient has no prior history of Echocardiogram examinations.                 Risk Factors:Hypertension, Dyslipidemia and Former Smoker.  Sonographer:    Dondra Prader RVT RCS Referring Phys: [id number] [facility]  Sonographer Comments: Technically  challenging study due to limited acoustic windows. Image acquisition challenging due to patient body habitus. Unable to measure atrial diameter due  to body habitus IMPRESSIONS  1. Left ventricular ejection fraction, by estimation, is 55 to 60%. The left ventricle has normal function. The left ventricle has no regional wall motion abnormalities. Left ventricular diastolic parameters were normal.  2. Right ventricular systolic function is normal. The right ventricular size is normal. Tricuspid regurgitation signal is inadequate for assessing PA pressure.  3. The mitral valve is normal in structure. No evidence of mitral valve regurgitation. No evidence of mitral stenosis.  4. The aortic valve is tricuspid. Aortic valve regurgitation is not visualized. No aortic stenosis is present.  5. Aortic dilatation noted. There is borderline dilatation of the aortic root, measuring 37 mm.  6. The inferior vena cava is normal in size with greater than 50% respiratory variability, suggesting right atrial pressure of 3 mmHg. Comparison(s): No prior Echocardiogram. FINDINGS  Left Ventricle: Left ventricular ejection fraction, by estimation, is 55 to 60%. The left ventricle has normal function. The left ventricle has no regional wall motion abnormalities. The left ventricular internal cavity size was normal in size. There is  no left ventricular hypertrophy. Left ventricular diastolic parameters were normal. Right Ventricle: The right ventricular size is normal. No increase in right ventricular wall thickness. Right ventricular systolic function is normal. Tricuspid regurgitation signal is inadequate for assessing PA pressure. Left Atrium: Left atrial size was normal in size. Right Atrium: Right atrial size was normal in size. Pericardium: There is no evidence of pericardial effusion. Mitral Valve: The mitral valve is normal in structure. No evidence of mitral valve regurgitation. No evidence of mitral valve stenosis. Tricuspid Valve:  The tricuspid valve is not well visualized. Tricuspid valve regurgitation is not demonstrated. No evidence of tricuspid stenosis. Aortic Valve: The aortic valve is tricuspid. Aortic valve regurgitation is not visualized. No aortic stenosis is present. Aortic valve mean gradient measures 2.0 mmHg. Aortic valve peak gradient measures 4.2 mmHg. Aortic valve area, by VTI measures 3.40 cm. Pulmonic Valve: The pulmonic valve was not well visualized. Pulmonic valve regurgitation is not visualized. No evidence of pulmonic stenosis. Aorta: Aortic dilatation noted. There is borderline dilatation of the aortic root, measuring 37 mm. Venous: The inferior vena cava is normal in size with greater than 50% respiratory variability, suggesting right atrial pressure of 3 mmHg. IAS/Shunts: No atrial level shunt detected by color flow Doppler.  LEFT VENTRICLE PLAX 2D LVIDd:         4.50 cm   Diastology LVIDs:         3.10 cm   LV e' medial:    7.18 cm/s LV PW:         1.10 cm   LV E/e' medial:  8.3 LV IVS:        1.10 cm   LV e' lateral:   15.60 cm/s LVOT diam:     2.10 cm   LV E/e' lateral: 3.8 LV SV:         55 LV SV Index:   29 LVOT Area:     3.46 cm  RIGHT VENTRICLE             IVC RV S prime:     14.00 cm/s  IVC diam: 1.30 cm TAPSE (M-mode): 1.6 cm LEFT ATRIUM         Index       RIGHT ATRIUM           Index LA diam:    2.40 cm 1.28 cm/m  RA Area:     10.60 cm  RA Volume:   21.70 ml  11.61 ml/m  AORTIC VALVE                    PULMONIC VALVE AV Area (Vmax):    3.57 cm     PV Vmax:       0.89 m/s AV Area (Vmean):   3.44 cm     PV Peak grad:  3.2 mmHg AV Area (VTI):     3.40 cm AV Vmax:           102.00 cm/s AV Vmean:          64.400 cm/s AV VTI:            0.162 m AV Peak Grad:      4.2 mmHg AV Mean Grad:      2.0 mmHg LVOT Vmax:         105.00 cm/s LVOT Vmean:        63.900 cm/s LVOT VTI:          0.159 m LVOT/AV VTI ratio: 0.98  AORTA Ao Root diam: 3.70 cm MITRAL VALVE MV Area (PHT): 4.06  cm    SHUNTS MV Decel Time: 187 msec    Systemic VTI:  0.16 m MV E velocity: 59.70 cm/s  Systemic Diam: 2.10 cm MV A velocity: 55.50 cm/s MV E/A ratio:  1.08 Vishnu Priya Mallipeddi Electronically signed by Winfield Rast Mallipeddi Signature Date/Time: 04/28/2023/3:09:07 PM    Final            LOS: 13 days   Time spent= 35 mins     Joline Maxcy, MD [facility]  If 7PM-7AM, please contact night-coverage  04/30/2023, 11:18 AM

## 2023-05-01 DIAGNOSIS — S72001A Fracture of unspecified part of neck of right femur, initial encounter for closed fracture: Secondary | ICD-10-CM | POA: Diagnosis not present

## 2023-05-01 MED ORDER — VITAMIN D (ERGOCALCIFEROL) 1.25 MG (50000 UNIT) PO CAPS
50000.0000 [IU] | ORAL_CAPSULE | ORAL | Status: DC
  Administered 2023-05-01 – 2023-05-08 (×2): 50000 [IU] via ORAL
  Filled 2023-05-01 (×2): qty 1

## 2023-05-01 MED ORDER — VITAMIN D (ERGOCALCIFEROL) 1.25 MG (50000 UNIT) PO CAPS: 50000.0000 [IU] | ORAL_CAPSULE | ORAL | Status: AC

## 2023-05-01 MED ORDER — FERROUS FUMARATE 324 (106 FE) MG PO TABS
1.0000 | ORAL_TABLET | Freq: Every day | ORAL | Status: DC
  Administered 2023-05-01 – 2023-05-11 (×11): 106 mg via ORAL
  Filled 2023-05-01 (×12): qty 1

## 2023-05-01 MED ORDER — FERROUS FUMARATE 324 (106 FE) MG PO TABS: 1.0000 | ORAL_TABLET | Freq: Every day | ORAL | Status: AC

## 2023-05-01 NOTE — Progress Notes (Signed)
Pt transferred to [facility] rm 21, Report called, VSS.   Balinda Quails, RN 05/01/2023 12:57 PM

## 2023-05-01 NOTE — TOC Progression Note (Signed)
Transition of Care [facility]) - Progression Note    Patient Details  Name: Scott Pruitt MRN: [id number] Date of Birth: Sep 08, 1961  [facility] CM/SW Contact  Lawerance Sabal, RN Phone Number: 05/01/2023, 10:39 AM  Clinical Narrative:     LVM w  Becky/admissions [facility] , cell: [phone] awaiting call back to see if she can admit   Expected Discharge Plan: Skilled Nursing Facility Barriers to Discharge: No SNF bed  Expected Discharge Plan and Services                                               Social Determinants of Health (SDOH) Interventions SDOH Screenings   Food Insecurity: Food Insecurity Present (04/18/2023)  Housing: Low Risk  (04/18/2023)  Transportation Needs: No Transportation Needs (04/18/2023)  Utilities: Not At Risk (04/28/2023)  Depression (PHQ2-9): Medium Risk (03/11/2020)  Social Connections: Unknown (02/02/2022)   Received from [facility]  Tobacco Use: Medium Risk (04/18/2023)    Readmission Risk Interventions     No data to display

## 2023-05-01 NOTE — Discharge Summary (Signed)
Physician Discharge Summary  Scott Pruitt MVH:[id number] DOB: 1961-03-26 DOA: 04/17/2023  PCP: Pcp, No  Admit date: 04/17/2023 Discharge date: 05/01/2023  Admitted From: Home Disposition:  SNF  Discharge Condition:Stable CODE STATUS:FULL Diet recommendation: Heart Healthy  Brief/Interim Summary: Patient is a 62 year old with history of HTN, HLD who presented to the hospital after a fall sustaining a right hip fracture.  Patient underwent IM nail of the right hip by Dr. Susa Simmonds on 7/25 thereafter PT OT recommended SNF.  While awaiting placement patient did have some cognitive decline prompting to obtain MRI of the brain which ended up showing acute CVA.  Neurology team was consulted.  Upon further workup patient was noted to have right lower extremity extensive DVT therefore started on anticoagulation.  Also had transcranial Dopplers with bubble study which was negative.  Patient was eventually transitioned to oral Eliquis.  Currently awaiting SNF placement.  Medically stable for discharge whenever possible.  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  Neurology team was consulted.  CTA head and neck does show some vertebral stenosis. - A1c 5.4, LDL 61 - Now on Eliquis - PT/OT-SNF - Started high-dose thiamine here, continue oral thiamine on discharge -Echo with preserved ejection fraction of 55 to 60%   Right lower extremity acute DVT Echocardiogram overall appears to be stable with preserved EF.    Now on Eliquis  Closed right hip fracture status post IM nail 7/25 ORIF by Ortho Dr. Susa Simmonds 7/25.  Follow-up outpatient recommended in around 2 weeks Initial recommendations was to be on aspirin full dose twice daily  for DVT prophylaxis.  Now continue Eliquis   Hypokalemia/hyponatremia Stable now.  Check BMP in a week   Normocytic anemia Hemoglobin overall stable around 9.8 Received a dose of IV iron, continue oral iron supplements on discharge   Vitamin D deficiency Continue high-dose regimen the supplementation    Transaminitis Resolved.  Denies alcohol use.  Viral serologies are negative   Hyperlipidemia Not on statin at present   Essential hypertension Blood pressure stable without any medication at present  Vitamin B12 deficiency Started on supplementation   3 mm pulmonary nodule - Follow-up outpatient   Discharge Diagnoses:  Principal Problem:   Closed right hip fracture [facility]) Active Problems:   Essential hypertension   Hyperlipidemia   Fall   Transaminitis   Hyponatremia   Vitamin D deficiency   Hypokalemia    Discharge Instructions  Discharge Instructions     Ambulatory referral to Neurology   Complete by: As directed    Follow up with stroke clinic NP at [facility] in about 4 weeks. Thanks.      Allergies as of 05/01/2023   No Known Allergies      Medication List     TAKE these medications    apixaban 5 MG Tabs tablet Commonly known as: ELIQUIS Take 1 tablet (5 mg total) by mouth 2 (two) times daily.   calcium carbonate 1250 (500 Ca) MG tablet Commonly known as: OS-CAL - dosed in mg of elemental calcium Take 1 tablet (1,250 mg total) by mouth daily with breakfast.   cyanocobalamin 1000 MCG tablet Take 1 tablet (1,000 mcg total) by mouth daily.   docusate sodium 100 MG capsule Commonly known as: COLACE Take  1 capsule (100 mg total) by mouth 2 (two) times daily as needed for mild constipation.   Ferrous Fumarate 324 (106 Fe) MG Tabs tablet Commonly known as: HEMOCYTE - 106 mg FE Take 1 tablet (106 mg of iron total) by mouth daily.   lisinopril-hydrochlorothiazide 20-25 MG tablet Commonly known as: ZESTORETIC Take 1 tablet by mouth daily.    multivitamin with minerals Tabs tablet Take 1 tablet by mouth daily.   oxyCODONE 5 MG immediate release tablet Commonly known as: Roxicodone Take 1-2 tablets (5-10 mg total) by mouth every 6 (six) hours as needed for severe pain.   thiamine 100 MG tablet Commonly known as: VITAMIN B1 Take 1 tablet (100 mg total) by mouth daily.   tizanidine 2 MG capsule Commonly known as: Zanaflex Take 1 capsule (2 mg total) by mouth 3 (three) times daily as needed for muscle spasms.   Vitamin D (Ergocalciferol) 1.25 MG (50000 UNIT) Caps capsule Commonly known as: DRISDOL Take 1 capsule (50,000 Units total) by mouth every 7 (seven) days for 10 doses.        Follow-up Information     Terance Hart, MD Follow up in 2 week(s).   Specialty: Orthopedic Surgery Contact information: [address] [place] [address] [phone]         Terance Hart, MD Follow up in 2 week(s).   Specialty: Orthopedic Surgery Why: Please call the clinic and schedule a follow up visit in 2 weeks. Contact information: [address] [place] [address] [phone]         [facility]. Schedule an appointment as soon as possible for a visit in 1 month(s).   Specialty: Neurology Why: stroke clinic Contact information: [address] [place] [address] [phone]               No Known Allergies  Consultations: Orthopedics, neurology   Procedures/Studies: VAS [place] TRANSCRANIAL DOPPLER W BUBBLES  Result Date: 04/28/2023  Transcranial Doppler with Bubble Patient Name:  Scott Pruitt  Date of Exam:   04/28/2023 Medical Rec #: [id number]     Accession #:    [id number] Date of Birth: 09/09/1961     Patient Gender: M Patient Age:   40 years Exam Location:  [facility] Procedure:      VAS [place] TRANSCRANIAL DOPPLER W BUBBLES Referring Phys: Scheryl Marten XU  --------------------------------------------------------------------------------  Indications: Stroke. History: RLE DVT s/p hip surgery/repair. Limitations: limited valsalva due to patient condition Comparison Study: No previous exams Performing Technologist: Jody Hill RVT, RDMS  Examination Guidelines: A complete evaluation includes B-mode imaging, spectral Doppler, color Doppler, and power Doppler as needed of all accessible portions of each vessel. Bilateral testing is considered an integral part of a complete examination. Limited examinations for reoccurring indications may be performed as noted.  Summary: No HITS at rest or during Valsalva. Negative transcranial Doppler Bubble study with no evidence of right to left intracardiac communication.  A vascular evaluation was performed. The right middle cerebral artery was studied. An IV was inserted into the patient's right forearm. Verbal informed consent was obtained.  *See table(s) above for TCD measurements and observations.    Preliminary    VAS [place] LOWER EXTREMITY VENOUS (DVT)  Result Date: 04/28/2023  Lower Venous DVT Study Patient Name:  Scott Pruitt  Date of Exam:   04/28/2023 Medical Rec #: [id number]     Accession #:    [id number] Date of Birth: [birth year]  Patient Gender: M Patient Age:   64 years Exam Location:  [facility] Procedure:      VAS [place] LOWER EXTREMITY VENOUS (DVT) Referring Phys: Scheryl Marten XU --------------------------------------------------------------------------------  Indications: Stroke.  Risk Factors: Recent fall S/P IM nailing of right hip on 04/18/23. Limitations: Patient positioning. Comparison Study: No previous exams Performing Technologist: Jody Hill RVT, RDMS  Examination Guidelines: A complete evaluation includes B-mode imaging, spectral Doppler, color Doppler, and power Doppler as needed of all accessible portions of each vessel. Bilateral testing is considered an integral part of a complete examination. Limited  examinations for reoccurring indications may be performed as noted. The reflux portion of the exam is performed with the patient in reverse Trendelenburg.  +---------+---------------+---------+-----------+----------+--------------+ RIGHT    CompressibilityPhasicitySpontaneityPropertiesThrombus Aging +---------+---------------+---------+-----------+----------+--------------+ CFV      Full           Yes      Yes                                 +---------+---------------+---------+-----------+----------+--------------+ SFJ      Full                                                        +---------+---------------+---------+-----------+----------+--------------+ FV Prox  None           No       No                   Acute          +---------+---------------+---------+-----------+----------+--------------+ FV Mid   None           No       No                   Acute          +---------+---------------+---------+-----------+----------+--------------+ FV DistalNone           No       No                   Acute          +---------+---------------+---------+-----------+----------+--------------+ PFV      Full                                                        +---------+---------------+---------+-----------+----------+--------------+ POP      None           No       No                   Acute          +---------+---------------+---------+-----------+----------+--------------+ PTV      None           No       No                   Acute          +---------+---------------+---------+-----------+----------+--------------+ PERO     None           No       No  Acute          +---------+---------------+---------+-----------+----------+--------------+   +---------+---------------+---------+-----------+----------+-------------------+ LEFT     CompressibilityPhasicitySpontaneityPropertiesThrombus Aging       +---------+---------------+---------+-----------+----------+-------------------+ CFV      Full           Yes      Yes                                      +---------+---------------+---------+-----------+----------+-------------------+ SFJ      Full                                                             +---------+---------------+---------+-----------+----------+-------------------+ FV Prox  Full           Yes      Yes                                      +---------+---------------+---------+-----------+----------+-------------------+ FV Mid   Full           Yes      Yes                                      +---------+---------------+---------+-----------+----------+-------------------+ FV DistalFull           Yes      Yes                                      +---------+---------------+---------+-----------+----------+-------------------+ PFV      Full                                                             +---------+---------------+---------+-----------+----------+-------------------+ POP      Full           Yes      Yes                                      +---------+---------------+---------+-----------+----------+-------------------+ PTV      Full                                                             +---------+---------------+---------+-----------+----------+-------------------+ PERO                                                  Not well visualized +---------+---------------+---------+-----------+----------+-------------------+     Summary: BILATERAL: -No evidence of popliteal cyst, bilaterally. RIGHT: -  Findings consistent with acute deep vein thrombosis involving the right femoral vein, right popliteal vein, right posterior tibial veins, and right peroneal veins.  LEFT: - There is no evidence of deep vein thrombosis in the lower extremity.  *See table(s) above for measurements and observations. Electronically signed by Heath Lark on 04/28/2023 at 5:02:53 PM.    Final    ECHOCARDIOGRAM COMPLETE  Result Date: 04/28/2023    ECHOCARDIOGRAM REPORT   Patient Name:   Scott Pruitt Date of Exam: 04/28/2023 Medical Rec #:  [id number]    Height:       69.0 in Accession #:    [id number]   Weight:       158.1 lb Date of Birth:  [birth year]    BSA:          1.869 m Patient Age:    62 years     BP:           103/84 mmHg Patient Gender: M            HR:           81 bpm. Exam Location:  Inpatient Procedure: 2D Echo, Cardiac Doppler and Color Doppler Indications:    Stroke I63.9  History:        Patient has no prior history of Echocardiogram examinations.                 Risk Factors:Hypertension, Dyslipidemia and Former Smoker.  Sonographer:    Dondra Prader RVT RCS Referring Phys: [id number]  Sonographer Comments: Technically challenging study due to limited acoustic windows. Image acquisition challenging due to patient body habitus. Unable to measure atrial diameter due to body habitus IMPRESSIONS  1. Left ventricular ejection fraction, by estimation, is 55 to 60%. The left ventricle has normal function. The left ventricle has no regional wall motion abnormalities. Left ventricular diastolic parameters were normal.  2. Right ventricular systolic function is normal. The right ventricular size is normal. Tricuspid regurgitation signal is inadequate for assessing PA pressure.  3. The mitral valve is normal in structure. No evidence of mitral valve regurgitation. No evidence of mitral stenosis.  4. The aortic valve is tricuspid. Aortic valve regurgitation is not visualized. No aortic stenosis is present.  5. Aortic dilatation noted. There is borderline dilatation of the aortic root, measuring 37 mm.  6. The inferior vena cava is normal in size with greater than 50% respiratory variability, suggesting right atrial pressure of 3 mmHg. Comparison(s): No prior Echocardiogram. FINDINGS  Left Ventricle: Left ventricular ejection fraction, by estimation,  is 55 to 60%. The left ventricle has normal function. The left ventricle has no regional wall motion abnormalities. The left ventricular internal cavity size was normal in size. There is  no left ventricular hypertrophy. Left ventricular diastolic parameters were normal. Right Ventricle: The right ventricular size is normal. No increase in right ventricular wall thickness. Right ventricular systolic function is normal. Tricuspid regurgitation signal is inadequate for assessing PA pressure. Left Atrium: Left atrial size was normal in size. Right Atrium: Right atrial size was normal in size. Pericardium: There is no evidence of pericardial effusion. Mitral Valve: The mitral valve is normal in structure. No evidence of mitral valve regurgitation. No evidence of mitral valve stenosis. Tricuspid Valve: The tricuspid valve is not well visualized. Tricuspid valve regurgitation is not demonstrated. No evidence of tricuspid stenosis. Aortic Valve: The aortic valve is tricuspid. Aortic valve regurgitation is not visualized. No aortic stenosis is present. Aortic valve mean  gradient measures 2.0 mmHg. Aortic valve peak gradient measures 4.2 mmHg. Aortic valve area, by VTI measures 3.40 cm. Pulmonic Valve: The pulmonic valve was not well visualized. Pulmonic valve regurgitation is not visualized. No evidence of pulmonic stenosis. Aorta: Aortic dilatation noted. There is borderline dilatation of the aortic root, measuring 37 mm. Venous: The inferior vena cava is normal in size with greater than 50% respiratory variability, suggesting right atrial pressure of 3 mmHg. IAS/Shunts: No atrial level shunt detected by color flow Doppler.  LEFT VENTRICLE PLAX 2D LVIDd:         4.50 cm   Diastology LVIDs:         3.10 cm   LV e' medial:    7.18 cm/s LV PW:         1.10 cm   LV E/e' medial:  8.3 LV IVS:        1.10 cm   LV e' lateral:   15.60 cm/s LVOT diam:     2.10 cm   LV E/e' lateral: 3.8 LV SV:         55 LV SV Index:   29 LVOT Area:      3.46 cm  RIGHT VENTRICLE             IVC RV S prime:     14.00 cm/s  IVC diam: 1.30 cm TAPSE (M-mode): 1.6 cm LEFT ATRIUM         Index       RIGHT ATRIUM           Index LA diam:    2.40 cm 1.28 cm/m  RA Area:     10.60 cm                                 RA Volume:   21.70 ml  11.61 ml/m  AORTIC VALVE                    PULMONIC VALVE AV Area (Vmax):    3.57 cm     PV Vmax:       0.89 m/s AV Area (Vmean):   3.44 cm     PV Peak grad:  3.2 mmHg AV Area (VTI):     3.40 cm AV Vmax:           102.00 cm/s AV Vmean:          64.400 cm/s AV VTI:            0.162 m AV Peak Grad:      4.2 mmHg AV Mean Grad:      2.0 mmHg LVOT Vmax:         105.00 cm/s LVOT Vmean:        63.900 cm/s LVOT VTI:          0.159 m LVOT/AV VTI ratio: 0.98  AORTA Ao Root diam: 3.70 cm MITRAL VALVE MV Area (PHT): 4.06 cm    SHUNTS MV Decel Time: 187 msec    Systemic VTI:  0.16 m MV E velocity: 59.70 cm/s  Systemic Diam: 2.10 cm MV A velocity: 55.50 cm/s MV E/A ratio:  1.08 Vishnu Priya Mallipeddi Electronically signed by Winfield Rast Mallipeddi Signature Date/Time: 04/28/2023/3:09:07 PM    Final    DG CHEST PORT 1 VIEW  Result Date: 04/28/2023 CLINICAL DATA:  Leukocytosis EXAM: PORTABLE CHEST 1 VIEW COMPARISON:  04/17/2023 FINDINGS: The heart size and mediastinal  contours are within normal limits. Both lungs are clear. The visualized skeletal structures are unremarkable. IMPRESSION: No active disease. Electronically Signed   By: Paulina Fusi M.D.   On: 04/28/2023 10:07   CT ANGIO HEAD NECK W WO CM  Result Date: 04/28/2023 CLINICAL DATA:  Follow-up exam for stroke. EXAM: CT ANGIOGRAPHY HEAD AND NECK WITH AND WITHOUT CONTRAST TECHNIQUE: Multidetector CT imaging of the head and neck was performed using the standard protocol during bolus administration of intravenous contrast. Multiplanar CT image reconstructions and MIPs were obtained to evaluate the vascular anatomy. Carotid stenosis measurements (when applicable) are obtained utilizing  NASCET criteria, using the distal internal carotid diameter as the denominator. RADIATION DOSE REDUCTION: This exam was performed according to the departmental dose-optimization program which includes automated exposure control, adjustment of the mA and/or kV according to patient size and/or use of iterative reconstruction technique. CONTRAST:  75mL OMNIPAQUE IOHEXOL 350 MG/ML SOLN COMPARISON:  Prior study from earlier the same day. FINDINGS: CT HEAD FINDINGS Brain: Cerebral volume within normal limits. 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 Previously identified punctate infarcts not visible by CT. No other acute large vessel territory infarct. No intracranial hemorrhage. No mass lesion or midline shift. No hydrocephalus or extra-axial fluid collection. Vascular: No hyperdense vessel. Skull: Small lipoma noted at the scalp vertex. No acute abnormality. Calvarium intact. Sinuses/Orbits: Globes and orbital soft tissues demonstrate no acute abnormality. Paranasal sinuses are largely clear. No mastoid effusion. Other: None. Review of the MIP images confirms the above findings CTA NECK FINDINGS Aortic arch: Visualized arch within normal limits for caliber. Origin of the great vessels incompletely visualized. Right carotid system: Right common and internal carotid arteries are patent without dissection. Mild atheromatous irregularity about the right carotid bulb without stenosis. Left carotid system: Left common and internal carotid arteries are patent without dissection. Mild atheromatous irregularity about the left carotid bulb without stenosis. Vertebral arteries: Both vertebral arteries arise from the subclavian arteries. No proximal subclavian artery stenosis. Right vertebral artery dominant. Atheromatous plaque at the origin of the left vertebral artery with severe ostial stenosis. Vertebral arteries otherwise patent without stenosis or  dissection. Skeleton: No discrete or worrisome osseous lesions. Moderate spondylosis present at C6-7. Other neck: No other acute finding. Upper chest: Mild emphysema. 3 mm left upper lobe pulmonary nodule noted (series 9, image 12). Review of the MIP images confirms the above findings CTA HEAD FINDINGS Anterior circulation: Both internal carotid arteries are patent to the termini without stenosis. A1 segments, anterior to artery complex common anterior cerebral arteries widely patent. No M1 stenosis or occlusion. No proximal MCA branch occlusion or high-grade stenosis. Distal MCA branches perfused and symmetric. Posterior circulation: Both V4 segments widely patent. Both PICA patent. Basilar widely patent without stenosis. Superior cerebellar arteries patent bilaterally. Right PCA supplied via the basilar as well as a prominent right posterior communicating artery. Fetal type origin of the left PCA. Both PCAs patent without stenosis. Venous sinuses: Patent allowing for timing the contrast bolus. Anatomic variants: As above.  No aneurysm. Review of the MIP images confirms the above findings IMPRESSION: CT HEAD IMPRESSION: 1. Previously identified punctate infarcts not visible by CT. No other acute intracranial abnormality. 2. 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 CTA HEAD AND NECK IMPRESSION: 1. Negative CTA for large vessel occlusion or other emergent finding. 2. Atheromatous plaque at the origin of the left vertebral  artery with severe stenosis. 3. Otherwise wide patency of the major arterial vasculature of the head and neck. No other hemodynamically significant or correctable stenosis. 4. 3 mm left upper lobe pulmonary nodule, indeterminate. Per Fleischner Society Guidelines, a non-contrast Chest CT at 12 months is optional. If performed and the nodule is stable at 12 months, no further follow-up is recommended. These guidelines do not  apply to immunocompromised patients and patients with cancer. Follow up in patients with significant comorbidities as clinically warranted. For lung cancer screening, adhere to Lung-RADS guidelines. Reference: Radiology. 2017; 284(1):228-43. Aortic Atherosclerosis (ICD10-I70.0) and Emphysema (ICD10-J43.9). Electronically Signed   By: Rise Mu M.D.   On: 04/28/2023 03:40   MR BRAIN WO CONTRAST  Result Date: 04/27/2023 CLINICAL DATA:  Tremor.  Memory impairment. EXAM: MRI HEAD WITHOUT CONTRAST TECHNIQUE: Multiplanar, multiecho pulse sequences of the brain and surrounding structures were obtained without intravenous contrast. COMPARISON:  None Available. FINDINGS: Brain: There are 2 punctate acute infarctions. One is present along the anterolateral margin of the temporal horn of the right lateral ventricle within the temporal lobe. The other is within the left frontoparietal vertex subcortical white matter. Small acute infarctions in these locations suggest embolic disease from the heart or ascending aorta. There chronic small-vessel ischemic changes affecting the pons. No focal cerebellar insult. Cerebral hemispheres otherwise show chronic small-vessel ischemic change of the white matter with a few old small vessel infarctions in the thalami and basal ganglia. No large vessel territory stroke. No mass, hemorrhage, hydrocephalus or extra-axial collection. Vascular: Major vessels at the base of the brain show flow. Skull and upper cervical spine: Negative Sinuses/Orbits: Clear/normal.  Some dysconjugate gaze. Other: None IMPRESSION: 1. Two punctate acute infarctions, in the right temporal lobe and the left frontoparietal vertex subcortical white matter. 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 2. Chronic small-vessel ischemic changes elsewhere throughout the brain as outlined above. Electronically Signed   By: Paulina Fusi M.D.   On: 04/27/2023 17:27   DG Abd Portable  1V  Result Date: 04/27/2023 CLINICAL DATA:  Foreign body in digestive tract EXAM: PORTABLE ABDOMEN - 1 VIEW SKULL - 2 VIEW COMPARISON:  None Available. FINDINGS: No radiopaque foreign body in the imaged portions of the skull and upper cervical spine. The bowel gas pattern is normal. No radio-opaque calculi or other significant radiographic abnormality are seen. IMPRESSION: No metallic foreign body Electronically Signed   By: Lorenza Cambridge M.D.   On: 04/27/2023 15:18   DG Skull 1-3 Views  Result Date: 04/27/2023 CLINICAL DATA:  Foreign body in digestive tract EXAM: PORTABLE ABDOMEN - 1 VIEW SKULL - 2 VIEW COMPARISON:  None Available. FINDINGS: No radiopaque foreign body in the imaged portions of the skull and upper cervical spine. The bowel gas pattern is normal. No radio-opaque calculi or other significant radiographic abnormality are seen. IMPRESSION: No metallic foreign body Electronically Signed   By: Lorenza Cambridge M.D.   On: 04/27/2023 15:18   DG FEMUR, MIN 2 VIEWS RIGHT  Result Date: 04/18/2023 CLINICAL DATA:  RIGHT femur fracture EXAM: RIGHT FEMUR 2 VIEWS COMPARISON:  Radiograph 04/17/2023 FINDINGS: Intramedullary nail fixation of the femur. Dynamic hip screw spans the femoral neck. IMPRESSION: No complication following intramedullary nail fixation femoral neck fracture Electronically Signed   By: Genevive Bi M.D.   On: 04/18/2023 17:39   CT Hip Right Wo Contrast  Result Date: 04/18/2023 CLINICAL DATA:  Trauma the right hip. EXAM: CT OF THE RIGHT HIP WITHOUT CONTRAST  TECHNIQUE: Multidetector CT imaging of the right hip was performed according to the standard protocol. Multiplanar CT image reconstructions were also generated. RADIATION DOSE REDUCTION: This exam was performed according to the departmental dose-optimization program which includes automated exposure control, adjustment of the mA and/or kV according to patient size and/or use of iterative reconstruction technique. COMPARISON:   Right knee radiograph dated 04/17/2023. FINDINGS: Bones/Joint/Cartilage There is a comminuted minimally displaced inter trochanteric fracture of the right femur. No other acute fracture. The bones are osteopenic. There is no dislocation. No joint effusion. Ligaments Suboptimally assessed by CT. Muscles and Tendons No acute findings.  No intramuscular fluid collection or hematoma. Soft tissues Aortoiliac atherosclerotic disease. The soft tissues are unremarkable. IMPRESSION: 1. Comminuted intertrochanteric fracture of the right femur. 2.  Aortic Atherosclerosis (ICD10-I70.0). Electronically Signed   By: Elgie Collard M.D.   On: 04/18/2023 01:58   DG Hip Unilat W or Wo Pelvis 2-3 Views Right  Addendum Date: 04/18/2023   ADDENDUM REPORT: 04/18/2023 01:54 ADDENDUM: The impression should read: Right intratrochanteric femoral fracture is noted. Electronically Signed   By: Alcide Clever M.D.   On: 04/18/2023 01:54   Result Date: 04/18/2023 CLINICAL DATA:  Recent fall with right hip pain, initial encounter EXAM: DG HIP (WITH OR WITHOUT PELVIS) 3V RIGHT COMPARISON:  None Available. FINDINGS: Right femoral head is well seated. Intratrochanteric fracture is noted with only mild displacement. Pelvic ring as visualized is within normal limits. IMPRESSION: Intratrochanteric Electronically Signed: By: Alcide Clever M.D. On: 04/17/2023 21:06   DG Chest Portable 1 View  Result Date: 04/17/2023 CLINICAL DATA:  fall EXAM: PORTABLE CHEST 1 VIEW COMPARISON:  CXR 09/12/18 FINDINGS: The heart size and mediastinal contours are within normal limits. Both lungs are clear. Chronic fracture of the lateral left fifth rib. IMPRESSION: 1.   No focal airspace opacity. 2.  Chronic left fifth rib fracture. Electronically Signed   By: Lorenza Cambridge M.D.   On: 04/17/2023 21:09      Subjective: Patient seen and examined at bedside today.  Hemodynamically stable.  Comfortable lying in bed.  Denies new complaints.  Alert and  oriented.  Discharge Exam: Vitals:   05/01/23 1140 05/01/23 1255  BP: 116/77 122/75  Pulse: 83 85  Resp: 18 18  Temp: 97.9 F (36.6 C) 98.3 F (36.8 C)  SpO2: 97% 97%   Vitals:   05/01/23 0500 05/01/23 0747 05/01/23 1140 05/01/23 1255  BP:  118/73 116/77 122/75  Pulse:  69 83 85  Resp:  15 18 18   Temp:  (!) 97.5 F (36.4 C) 97.9 F (36.6 C) 98.3 F (36.8 C)  TempSrc:  Oral Oral   SpO2:  97% 97% 97%  Weight: 74.4 kg     Height:        General: Pt is alert, awake, not in acute distress Cardiovascular: RRR, S1/S2 +, no rubs, no gallops Respiratory: CTA bilaterally, no wheezing, no rhonchi Abdominal: Soft, NT, ND, bowel sounds + Extremities: no edema, no cyanosis    The results of significant diagnostics from this hospitalization (including imaging, microbiology, ancillary and laboratory) are listed below for reference.     Microbiology: No results found for this or any previous visit (from the past 240 hour(s)).   Labs: BNP (last 3 results) No results for input(s): "BNP" in the last 8760 hours. Basic Metabolic Panel: Recent Labs  Lab 04/26/23 0515 04/28/23 0325 04/29/23 0656 04/30/23 0202 05/01/23 0204  NA 133* 129* 129* 131* 130*  K 4.3 4.0  3.9 3.8 3.8  CL 93* 95* 96* 99 98  CO2 30 25 26 22 25   GLUCOSE 99 94 96 89 113*  BUN 18 22 20 18 18   CREATININE 0.77 0.86 1.05 0.79 0.91  CALCIUM 8.7* 8.5* 8.2* 8.2* 8.0*  MG  --   --  1.9 2.0 1.9  PHOS  --   --  3.6  --   --    Liver Function Tests: No results for input(s): "AST", "ALT", "ALKPHOS", "BILITOT", "PROT", "ALBUMIN" in the last 168 hours. No results for input(s): "LIPASE", "AMYLASE" in the last 168 hours. No results for input(s): "AMMONIA" in the last 168 hours. CBC: Recent Labs  Lab 04/26/23 0515 04/28/23 0325 04/29/23 0656 04/30/23 0202 05/01/23 0204  WBC 10.0 12.3* 10.3 9.0 8.6  HGB 10.8* 9.8* 9.5* 9.3* 8.7*  HCT 33.1* 29.6* 28.6* 28.2* 27.2*  MCV 88.5 89.4 87.5 87.3 90.1  PLT 335 339 382  385 431*   Cardiac Enzymes: No results for input(s): "CKTOTAL", "CKMB", "CKMBINDEX", "TROPONINI" in the last 168 hours. BNP: Invalid input(s): "POCBNP" CBG: No results for input(s): "GLUCAP" in the last 168 hours. D-Dimer No results for input(s): "DDIMER" in the last 72 hours. Hgb A1c No results for input(s): "HGBA1C" in the last 72 hours. Lipid Profile No results for input(s): "CHOL", "HDL", "LDLCALC", "TRIG", "CHOLHDL", "LDLDIRECT" in the last 72 hours. Thyroid function studies No results for input(s): "TSH", "T4TOTAL", "T3FREE", "THYROIDAB" in the last 72 hours.  Invalid input(s): "FREET3" Anemia work up No results for input(s): "VITAMINB12", "FOLATE", "FERRITIN", "TIBC", "IRON", "RETICCTPCT" in the last 72 hours. Urinalysis    Component Value Date/Time   COLORURINE YELLOW 02/21/2023 1023   APPEARANCEUR CLEAR 02/21/2023 1023   LABSPEC 1.009 02/21/2023 1023   PHURINE 5.0 02/21/2023 1023   GLUCOSEU NEGATIVE 02/21/2023 1023   HGBUR NEGATIVE 02/21/2023 1023   BILIRUBINUR NEGATIVE 02/21/2023 1023   BILIRUBINUR negative 09/12/2018 1121   BILIRUBINUR neg 12/03/2014 1521   KETONESUR NEGATIVE 02/21/2023 1023   PROTEINUR NEGATIVE 02/21/2023 1023   UROBILINOGEN 1.0 09/12/2018 1121   NITRITE NEGATIVE 02/21/2023 1023   LEUKOCYTESUR NEGATIVE 02/21/2023 1023   Sepsis Labs Recent Labs  Lab 04/28/23 0325 04/29/23 0656 04/30/23 0202 05/01/23 0204  WBC 12.3* 10.3 9.0 8.6   Microbiology No results found for this or any previous visit (from the past 240 hour(s)).  Please note: You were cared for by a hospitalist during your hospital stay. Once you are discharged, your primary care physician will handle any further medical issues. Please note that NO REFILLS for any discharge medications will be authorized once you are discharged, as it is imperative that you return to your primary care physician (or establish a relationship with a primary care physician if you do not have one) for  your post hospital discharge needs so that they can reassess your need for medications and monitor your lab values.    Time coordinating discharge: 40 minutes  SIGNED:   Burnadette Pop, MD  [facility] 05/01/2023, 2:24 PM Pager [phone]  If 7PM-7AM, please contact night-coverage [email or web address] Password [email or web address]

## 2023-05-01 NOTE — Progress Notes (Signed)
Occupational Therapy Treatment Patient Details Name: Scott Pruitt MRN: [id number] DOB: 1960-09-26 Today's Date: 05/01/2023   History of present illness 62 y.o. male admitted to [facility] on 04/17/2023 with acute right hip fracture after ground-level mechanical fall after presenting from home to [facility] ED complaining of right hip pain. He is s/p nailing of R hip fx 04/18/23. Past medical history significant for essential hypertension, hyperlipidemia. 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 MRI also notable for chronic small vessel disease.   OT comments  Pt with slow, steady progress this session. Due for goal update, but goals downgraded due to pt with slow progress. Pt requiring min guard A this session given increased time and achievement of optimal position (feet on floor). Pt able to wash face at EOB and engage in BUE exercises challenging strength and balance by reaching outside of BOS. Needing constant cueing for posture. STS x2 with up to max A of 2 with therapist facilitation of proper hand placement, anterior weight shift, and initiation of rise. Pt with good effort, but needing max assist for upright position/posture in standing. Patient will benefit from continued inpatient follow up therapy, <3 hours/day.       If plan is discharge home, recommend the following:  A lot of help with walking and/or transfers;A lot of help with bathing/dressing/bathroom;Assistance with cooking/housework;Assist for transportation;Help with stairs or ramp for entrance;Direct supervision/assist for financial management;Direct supervision/assist for medications management   Equipment Recommendations  Other (comment) (defer)    Recommendations for Other Services      Precautions / Restrictions Precautions Precautions: Fall Restrictions RLE Weight  Bearing: Weight bearing as tolerated       Mobility Bed Mobility Overal bed mobility: Needs Assistance Bed Mobility: Supine to Sit, Sit to Supine     Supine to sit: Mod assist, HOB elevated Sit to supine: Mod assist   General bed mobility comments: Patient able to initiate movement in and out of bed. Requires assist to raise trunk and position at edge of bed using bed pad to assist with scooting. Needs assist to move R LE in bed.    Transfers Overall transfer level: Needs assistance Equipment used: Rolling walker (2 wheels) Transfers: Sit to/from Stand Sit to Stand: Max assist, +2 safety/equipment           General transfer comment: Max A for STS due to need for heavy cueing for hand placement, facilitation of anterior weightshift, and rise. Max multimodal cues for facilitation of upright posture in standing; L lateral lean, forward rounded shoulders, and flexion at bil hips in standing and unable to achieve optimal posture.     Balance Overall balance assessment: Needs assistance Sitting-balance support: Feet supported Sitting balance-Leahy Scale: Poor Sitting balance - Comments: L lateral lean in sititng and requriing min A progressing to CGA statically. Cues for anterior weight shift and midline positionig. Postural control: Left lateral lean Standing balance support: Bilateral upper extremity supported Standing balance-Leahy Scale: Poor Standing balance comment: reliant on therapist                           ADL either performed or assessed with clinical judgement   ADL Overall ADL's : Needs assistance/impaired     Grooming: Wash/dry hands;Wash/dry face;Contact guard assist;Sitting Grooming Details (indicate cue type and reason): L  lateral lean noted and initially requiring min A progressing to CGA                 Toilet Transfer: +2 for physical assistance;+2 for safety/equipment;Maximal assistance             General ADL Comments: Focused  session on sititng balance, participation in ADL, bed mobility, and posture in sitting and standing. Needing external facilitation to tuck bottom and achieve upright posture in standing. L lateral lean in sititng; able to correct with multimodal cuesn    Extremity/Trunk Assessment Upper Extremity Assessment Upper Extremity Assessment: Generalized weakness   Lower Extremity Assessment Lower Extremity Assessment: Defer to PT evaluation        Vision       Perception     Praxis      Cognition Arousal: Alert Behavior During Therapy: Flat affect Overall Cognitive Status: No family/caregiver present to determine baseline cognitive functioning                                 General Comments: Pt following one step commands, able to report when OT asking to perform visual motor task that his eyes have been a certain way for several years. Following all one step commands with increeased time. Intermittent cues needed for safety, initiation, and sequencing of mobility.        Exercises Exercises: Other exercises Other Exercises Other Exercises: sitting EOB, reaching outside BOS with BUE 10x ea with increased time and encouragement. Not observed to FF at shoulder >110 degrees. Other Exercises: Facilitation of upright posture in sitting and standing with max multimodal facilitation from therapist to achieve upright. Continues with L lateral lean deapite external support in standing.    Shoulder Instructions       General Comments VSS    Pertinent Vitals/ Pain       Pain Assessment Pain Assessment: Faces Faces Pain Scale: Hurts little more Pain Location: R hip/leg Pain Descriptors / Indicators: Discomfort, Grimacing, Guarding Pain Intervention(s): Limited activity within patient's tolerance, Monitored during session  Home Living                                          Prior Functioning/Environment              Frequency  Min 1X/week         Progress Toward Goals  OT Goals(current goals can now be found in the care plan section)  Progress towards OT goals: Progressing toward goals  Acute Rehab OT Goals Patient Stated Goal: sit EOB OT Goal Formulation: With patient Time For Goal Achievement: 05/03/23 Potential to Achieve Goals: Good  Plan Discharge plan remains appropriate;Frequency remains appropriate    Co-evaluation                 AM-PAC OT "6 Clicks" Daily Activity     Outcome Measure   Help from another person eating meals?: A Little Help from another person taking care of personal grooming?: A Little Help from another person toileting, which includes using toliet, bedpan, or urinal?: A Lot Help from another person bathing (including washing, rinsing, drying)?: A Lot Help from another person to put on and taking off regular upper body clothing?: A Little Help from another person to put on and taking off regular lower body clothing?: A  Lot 6 Click Score: 15    End of Session Equipment Utilized During Treatment: Gait belt;Rolling walker (2 wheels)  OT Visit Diagnosis: Unsteadiness on feet (R26.81);Other abnormalities of gait and mobility (R26.89);Muscle weakness (generalized) (M62.81);Pain Pain - Right/Left: Right Pain - part of body: Hip;Leg   Activity Tolerance Patient tolerated treatment well   Patient Left in bed;with call bell/phone within reach;with bed alarm set (bed on RN request as anticipated to move from 3W to [facility])   Nurse Communication Mobility status        Time: 1030-1047 OT Time Calculation (min): 17 min  Charges: OT General Charges $OT Visit: 1 Visit OT Treatments $Therapeutic Activity: 8-22 mins  Myrla Halsted, OTD, OTR/L [facility] Acute [facility] Office: [phone]   Myrla Halsted 05/01/2023, 1:33 PM

## 2023-05-02 MED ORDER — SENNA 8.6 MG PO TABS
1.0000 | ORAL_TABLET | Freq: Two times a day (BID) | ORAL | Status: DC
  Administered 2023-05-02 – 2023-05-11 (×16): 8.6 mg via ORAL
  Filled 2023-05-02 (×17): qty 1

## 2023-05-02 MED ORDER — POLYETHYLENE GLYCOL 3350 17 G PO PACK
17.0000 g | PACK | Freq: Every day | ORAL | Status: DC
  Administered 2023-05-02 – 2023-05-04 (×3): 17 g via ORAL
  Filled 2023-05-02 (×4): qty 1

## 2023-05-02 MED ORDER — POLYETHYLENE GLYCOL 3350 17 G PO PACK: 17.0000 g | PACK | Freq: Every day | ORAL | Status: AC

## 2023-05-02 MED ORDER — BISACODYL 10 MG RE SUPP
10.0000 mg | Freq: Once | RECTAL | Status: AC
  Administered 2023-05-02: 10 mg via RECTAL
  Filled 2023-05-02: qty 1

## 2023-05-02 MED ORDER — SENNA 8.6 MG PO TABS: 1.0000 | ORAL_TABLET | Freq: Two times a day (BID) | ORAL | Status: AC

## 2023-05-02 NOTE — Progress Notes (Signed)
Patient seen and examined the bedside today.  Hemodynamically stable.  Comfortably lying on bed.  No bowel movement for last few days, ordered MiraLAX, Senokot and Dulcolax suppository.  Abdomen is soft, nondistended, nontender.  He is medically stable for discharge to SNF whenever possible.  Discharge orders and summary are already in place.  No change in the medical management

## 2023-05-02 NOTE — Discharge Instructions (Signed)
Information on my medicine - ELIQUIS (apixaban)  Why was Eliquis prescribed for you? Eliquis was prescribed to treat blood clots that may have been found in the veins of your legs (deep vein thrombosis) or in your lungs (pulmonary embolism) and to reduce the risk of them occurring again.  What do You need to know about Eliquis ? The dose is 5 mg (one 5 mg tablet) taken TWICE daily.  Eliquis may be taken with or without food.   Try to take the dose about the same time in the morning and in the evening. If you have difficulty swallowing the tablet whole please discuss with your pharmacist how to take the medication safely.  Take Eliquis exactly as prescribed and DO NOT stop taking Eliquis without talking to the doctor who prescribed the medication.  Stopping may increase your risk of developing a new blood clot.  Refill your prescription before you run out.  After discharge, you should have regular check-up appointments with your healthcare provider that is prescribing your Eliquis.    What do you do if you miss a dose? If a dose of ELIQUIS is not taken at the scheduled time, take it as soon as possible on the same day and twice-daily administration should be resumed. The dose should not be doubled to make up for a missed dose.  Important Safety Information A possible side effect of Eliquis is bleeding. You should call your healthcare provider right away if you experience any of the following: Bleeding from an injury or your nose that does not stop. Unusual colored urine (red or dark brown) or unusual colored stools (red or black). Unusual bruising for unknown reasons. A serious fall or if you hit your head (even if there is no bleeding).  Some medicines may interact with Eliquis and might increase your risk of bleeding or clotting while on Eliquis. To help avoid this, consult your healthcare provider or pharmacist prior to using any new prescription or non-prescription medications,  including herbals, vitamins, non-steroidal anti-inflammatory drugs (NSAIDs) and supplements.  This website has more information on Eliquis (apixaban): http://www.eliquis.com/eliquis/home

## 2023-05-02 NOTE — Plan of Care (Signed)
  Problem: Clinical Measurements: Goal: Ability to maintain clinical measurements within normal limits will improve Outcome: Progressing Goal: Will remain free from infection Outcome: Progressing Goal: Diagnostic test results will improve Outcome: Progressing Goal: Respiratory complications will improve Outcome: Progressing Goal: Cardiovascular complication will be avoided Outcome: Progressing   Problem: Nutrition: Goal: Adequate nutrition will be maintained Outcome: Progressing   Problem: Elimination: Goal: Will not experience complications related to urinary retention Outcome: Progressing   Problem: Pain Managment: Goal: General experience of comfort will improve Outcome: Progressing   Problem: Safety: Goal: Ability to remain free from injury will improve Outcome: Progressing   Problem: Skin Integrity: Goal: Risk for impaired skin integrity will decrease Outcome: Progressing   Problem: Ischemic Stroke/TIA Tissue Perfusion: Goal: Complications of ischemic stroke/TIA will be minimized Outcome: Progressing   Problem: Coping: Goal: Will identify appropriate support needs Outcome: Progressing   Problem: Nutrition: Goal: Risk of aspiration will decrease Outcome: Progressing Goal: Dietary intake will improve Outcome: Progressing

## 2023-05-02 NOTE — TOC Progression Note (Addendum)
Transition of Care [facility]) - Progression Note    Patient Details  Name: Scott Pruitt MRN: [id number] Date of Birth: May 24, 1961  [facility]) CM/SW Contact  Erin Sons, [place] Phone Number: 05/02/2023, 10:33 AM  Clinical Narrative:      LVM w  Becky/admissions [facility] , cell: [phone], requesting return call.   Referral faxed to [facility](SNF) Fax# [phone] Phone# [phone]  1345: CSW spoke with Darl Pikes at [facility] and is informed that they do not have a bed currently. She is willing to check into it nextweek to see if one would available then.   CSW called Kriste Basque with [facility] who informs CSW that they do not currently have a bed. She stated she TOC can continue calling to check about bed availability in case one opens up.   1520: Referral faxed to [facility] at [phone]  1630:  Called and left voicemail with [facility]  Expected Discharge Plan: Skilled Nursing Facility Barriers to Discharge: No SNF bed    Social Determinants of Health (SDOH) Interventions SDOH Screenings   Food Insecurity: Food Insecurity Present (04/18/2023)  Housing: Low Risk  (04/18/2023)  Transportation Needs: No Transportation Needs (04/18/2023)  Utilities: Not At Risk (04/28/2023)  Depression (PHQ2-9): Medium Risk (03/11/2020)  Social Connections: Unknown (02/02/2022)   Received from [facility]  Tobacco Use: Medium Risk (04/18/2023)    Readmission Risk Interventions     No data to display

## 2023-05-02 NOTE — Plan of Care (Signed)

## 2023-05-03 DIAGNOSIS — S72001A Fracture of unspecified part of neck of right femur, initial encounter for closed fracture: Secondary | ICD-10-CM | POA: Diagnosis not present

## 2023-05-03 NOTE — Progress Notes (Signed)
PROGRESS NOTE  Scott Pruitt  EXB:[id number] DOB: [birth year] DOA: 04/17/2023 PCP: Pcp, No   Brief Narrative: Patient is a 62 year old with history of HTN, HLD who presented to the hospital after a fall sustaining a right hip fracture.  Patient underwent IM nail of the right hip by Dr. Susa Simmonds on 7/25 thereafter PT OT recommended SNF.  While awaiting placement patient did have some cognitive decline prompting to obtain MRI of the brain which ended up showing acute CVA.  Neurology team was consulted.  Upon further workup patient was noted to have right lower extremity extensive DVT therefore started on anticoagulation.  Also had transcranial Dopplers with bubble study which was negative.  Patient was eventually transitioned to oral Eliquis.  Currently awaiting SNF placement.  Medically stable for discharge whenever possible.   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  Neurology team was consulted.  CTA head and neck does show some vertebral stenosis. - A1c 5.4, LDL 61 - Now on Eliquis - PT/OT-SNF - Started high-dose thiamine here, continue oral thiamine on discharge -Echo with preserved ejection fraction of 55 to 60%   Right lower extremity acute DVT Echocardiogram overall appears to be stable with preserved EF.    Now on Eliquis  Closed right hip fracture status post IM nail 7/25 ORIF by Ortho Dr. Susa Simmonds 7/25.  Follow-up outpatient recommended in around 2 weeks Initial recommendations was to be on aspirin full dose twice daily for DVT prophylaxis.  Now continue  Eliquis   Hypokalemia/hyponatremia Stable now.  Check BMP in a week   Normocytic anemia Hemoglobin overall stable around 9.8 Received a dose of IV iron, continue oral iron supplements on discharge   Vitamin D deficiency Continue high-dose regimen the supplementation    Transaminitis Resolved.  Denies alcohol use.  Viral serologies are negative   Hyperlipidemia Not on statin at present   Essential hypertension Blood pressure stable without any medication at present   Vitamin B12 deficiency Started on supplementation   3 mm pulmonary nodule - Follow-up outpatient  Constipation Continue bowel regimen     Pressure Injury 04/22/23 Sacrum Mid Stage 2 -  Partial thickness loss of dermis presenting as a shallow open injury with a red, pink wound bed without slough. blister (Active)  04/22/23 0230  Location: Sacrum  Location Orientation: Mid  Staging: Stage 2 -  Partial thickness loss of dermis presenting as a shallow open injury with a red, pink wound bed without slough.  Wound Description (Comments): blister  Present on Admission: No  Dressing Type Foam - Lift dressing to assess site every shift 05/02/23 0800    DVT prophylaxis:SCDs Start: 04/18/23 1513 SCDs Start: 04/17/23 2249 apixaban (ELIQUIS) tablet 5 mg     Code Status: Full Code  Family Communication: None at bedside  Patient status:Inpatient  Patient is from :Home  Anticipated discharge to:SNF  Estimated DC date:whenever possible   Consultants: Neurology  Procedures:None  Antimicrobials:  Anti-infectives (From admission, onward)    Start     Dose/Rate Route Frequency Ordered Stop   04/18/23 1800  ceFAZolin (ANCEF) IVPB 1 g/50 mL premix        1 g 100 mL/hr over 30 Minutes Intravenous Every 6 hours 04/18/23 1512 04/19/23 0715       Subjective: Patient seen and examined at bedside today.  Hemodynamically stable.  Comfortable.  Lying on bed.  No new change from yesterday.  Had a large bowel  movement yesterday.  Objective: Vitals:   05/02/23 1636 05/02/23 2016 05/03/23 0500 05/03/23 0725  BP: 115/81 119/74  132/80  Pulse: 81 86  75  Resp: 18 17  18   Temp: 98.1 F (36.7 C) 98.3 F (36.8 C)  97.8 F (36.6 C)  TempSrc: Oral Oral  Oral  SpO2: 98% 98%  95%  Weight:   71.3 kg   Height:        Intake/Output Summary (Last 24 hours) at 05/03/2023 1044 Last data filed at 05/03/2023 0725 Gross per 24 hour  Intake --  Output 500 ml  Net -500 ml   Filed Weights   04/30/23 0438 05/01/23 0500 05/03/23 0500  Weight: 74.9 kg 74.4 kg 71.3 kg    Examination:  General exam: Overall comfortable, not in distress HEENT: PERRL Respiratory system:  no wheezes or crackles  Cardiovascular system: S1 & S2 heard, RRR.  Gastrointestinal system: Abdomen is nondistended, soft and nontender. Central nervous system: Alert and oriented Extremities: No edema, no clubbing ,no cyanosis Skin: No rashes, no ulcers,no icterus     Data Reviewed: I have personally reviewed following labs and imaging studies  CBC: Recent Labs  Lab 04/29/23 0656 04/30/23 0202 05/01/23 0204 05/02/23 0057 05/03/23 0013  WBC 10.3 9.0 8.6 8.4 8.6  HGB 9.5* 9.3* 8.7* 9.8* 9.7*  HCT 28.6* 28.2* 27.2* 30.3* 30.5*  MCV 87.5 87.3 90.1 92.9 92.1  PLT 382 385 431* 411* 477*   Basic Metabolic Panel: Recent Labs  Lab 04/29/23 0656 04/30/23 0202 05/01/23 0204 05/02/23 0057 05/03/23 0013  NA 129* 131* 130* 136 133*  K 3.9 3.8 3.8 4.1 4.1  CL 96* 99 98 99 98  CO2 26 22 25 23 27   GLUCOSE 96 89 113* 85 105*  BUN 20 18 18 17 19   CREATININE 1.05 0.79 0.91 0.86 1.03  CALCIUM 8.2* 8.2* 8.0* 8.6* 8.4*  MG 1.9 2.0 1.9 1.9 2.0  PHOS 3.6  --   --   --   --      No results found for this or any previous visit (from the past 240 hour(s)).   Radiology Studies: No results found.  Scheduled Meds:  apixaban  5 mg Oral BID   calcium carbonate  1 tablet Oral Q breakfast   cholecalciferol  1,000 Units Oral Daily    vitamin B-12  1,000 mcg Oral Daily   docusate sodium  100 mg Oral BID   Ferrous Fumarate  1 tablet Oral Daily   multivitamin with minerals  1 tablet Oral Daily   polyethylene glycol  17 g Oral Daily   senna  1 tablet Oral BID   [START ON 05/06/2023] thiamine (VITAMIN B1) injection  100 mg Intravenous Daily   Vitamin D (Ergocalciferol)  50,000 Units Oral Q7 days   Continuous Infusions:  thiamine (VITAMIN B1) injection 250 mg (05/02/23 0937)     LOS: 16 days   Burnadette Pop, MD [facility] P8/05/2023, 10:44 AM

## 2023-05-03 NOTE — Plan of Care (Signed)
  Problem: Clinical Measurements: Goal: Ability to maintain clinical measurements within normal limits will improve Outcome: Progressing Goal: Will remain free from infection Outcome: Progressing Goal: Diagnostic test results will improve Outcome: Progressing Goal: Respiratory complications will improve Outcome: Progressing Goal: Cardiovascular complication will be avoided Outcome: Progressing   Problem: Nutrition: Goal: Adequate nutrition will be maintained Outcome: Progressing   Problem: Coping: Goal: Level of anxiety will decrease Outcome: Progressing   Problem: Elimination: Goal: Will not experience complications related to bowel motility Outcome: Progressing Goal: Will not experience complications related to urinary retention Outcome: Progressing   Problem: Safety: Goal: Ability to remain free from injury will improve Outcome: Progressing   Problem: Nutrition: Goal: Dietary intake will improve Outcome: Progressing

## 2023-05-03 NOTE — Progress Notes (Signed)
Physical Therapy Treatment Patient Details Name: Scott Pruitt MRN: [id number] DOB: 1961-04-06 Today's Date: 05/03/2023   History of Present Illness 62 y.o. male admitted to [facility] on 04/17/2023 with acute right hip fracture after ground-level mechanical fall after presenting from home to [facility] ED complaining of right hip pain. He is s/p nailing of R hip fx 04/18/23. Past medical history significant for essential hypertension, hyperlipidemia. 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 MRI also notable for chronic small vessel disease.    PT Comments  Pt received in supine, initially reluctant to participate in OOB mobility but after RN premedication and with heavy encouragement, pt able to participate in transfer training. Pt needing multimodal cues for supine BLE exercises and slow processing throughout. Pt performed sit<>stand and pivot to chair on his R side with up to maxA +2 for safety. Pt bed soiled but had not alerted staff, bed linens changed with NT assist who also assisted with hygiene during session. Pt up in chair with alarm on for safety and PTA assisted him to set up his dinner per patient request. Pt continues to benefit from PT services to progress toward functional mobility goals.     If plan is discharge home, recommend the following: Two people to help with walking and/or transfers;A lot of help with bathing/dressing/bathroom;Assistance with cooking/housework;Direct supervision/assist for medications management;Assist for transportation;Help with stairs or ramp for entrance   Can travel by private vehicle     No  Equipment Recommendations  Other (comment)    Recommendations for Other Services       Precautions / Restrictions Precautions Precautions: Fall Precaution Comments: bowel/bladder  incontinence Restrictions Weight Bearing Restrictions: Yes RLE Weight Bearing: Weight bearing as tolerated     Mobility  Bed Mobility Overal bed mobility: Needs Assistance Bed Mobility: Supine to Sit Rolling: Min assist Sidelying to sit: Mod assist, HOB elevated, Used rails       General bed mobility comments: Patient able to initiate movement in and out of bed. Requires assist to raise trunk and position at edge of bed using bed pad to assist with scooting. Needs assist to move R LE in bed.    Transfers Overall transfer level: Needs assistance Equipment used: Rolling walker (2 wheels) Transfers: Sit to/from Stand Sit to Stand: Max assist, +2 safety/equipment, From elevated surface   Step pivot transfers: +2 physical assistance, Max assist, From elevated surface       General transfer comment: Max A for STS due to need for heavy cueing for hand placement, facilitation of anterior weight shift, and rise. Max multimodal cues for facilitation of upright posture in standing as pt with flexed posture throughout. Pt responds well to cues "use your arms more" to push up trunk. Pt needs manual assist to slide each leg but able to weight shift with cues and needs manual assist to manage RW. NT present providing peri-care in stance due to soiled bed.    Ambulation/Gait               General Gait Details: pt unable, he took ~4 small shuffled steps toward his R side with RW to pivot to chair   Stairs             Wheelchair Mobility     Tilt Bed    Modified Rankin (Stroke Patients Only)  Balance Overall balance assessment: Needs assistance Sitting-balance support: Feet supported Sitting balance-Leahy Scale: Poor     Standing balance support: Bilateral upper extremity supported Standing balance-Leahy Scale: Poor Standing balance comment: reliant on therapist and RW support                            Cognition Arousal: Alert Behavior During  Therapy: Flat affect Overall Cognitive Status: No family/caregiver present to determine baseline cognitive functioning                                 General Comments: Pt following one step commands with increeased time. Intermittent cues needed for safety, initiation, and sequencing of mobility. Pt initially self-limiting but when PTA explained rationale for OOB mobility and RN gave IV pain meds, pt agreeable to attempt OOB transfers. Pt with increased difficulty sequencing BLE movements in standing, unsure if due to CVA or distraction with pain.        Exercises Other Exercises Other Exercises: seated BLE AAROM: ankle pumps, heel slides, hip abduction, SAQ x10 reps ea    General Comments General comments (skin integrity, edema, etc.): no acute s/sx distress; NT present to assist with hygiene and bed linen change during session; bed alarm placed and turned on      Pertinent Vitals/Pain Pain Assessment Pain Assessment: Faces Faces Pain Scale: Hurts even more Pain Location: R hip/leg Pain Descriptors / Indicators: Discomfort, Grimacing, Guarding, Moaning Pain Intervention(s): Limited activity within patient's tolerance, Monitored during session, Repositioned, Ice applied, RN gave pain meds during session    Home Living                          Prior Function            PT Goals (current goals can now be found in the care plan section) Acute Rehab PT Goals PT Goal Formulation: Patient unable to participate in goal setting Time For Goal Achievement: 05/03/23 Progress towards PT goals: Progressing toward goals    Frequency    Min 1X/week      PT Plan Current plan remains appropriate    Co-evaluation              AM-PAC PT "6 Clicks" Mobility   Outcome Measure  Help needed turning from your back to your side while in a flat bed without using bedrails?: A Lot Help needed moving from lying on your back to sitting on the side of a flat bed  without using bedrails?: A Lot Help needed moving to and from a bed to a chair (including a wheelchair)?: Total Help needed standing up from a chair using your arms (e.g., wheelchair or bedside chair)?: A Lot Help needed to walk in hospital room?: Total Help needed climbing 3-5 steps with a railing? : Total 6 Click Score: 9    End of Session Equipment Utilized During Treatment: Gait belt Activity Tolerance: Patient limited by fatigue;Patient limited by pain Patient left: in chair;with call bell/phone within reach;with chair alarm set;Other (comment) (heels floated; pt set up to eat his dinner) Nurse Communication: Mobility status;Patient requests pain meds;Need for lift equipment;Other (comment) (recommend use [employer] (may need to borrow from 5N or 52M unit)) PT Visit Diagnosis: Other abnormalities of gait and mobility (R26.89);Muscle weakness (generalized) (M62.81);Pain Pain - Right/Left: Right Pain - part of body: Hip;Leg  Time: 1610-9604 PT Time Calculation (min) (ACUTE ONLY): 43 min  Charges:    $Therapeutic Exercise: 8-22 mins $Therapeutic Activity: 23-37 mins PT General Charges $$ ACUTE PT VISIT: 1 Visit                      P., PTA Acute Rehabilitation Services Secure Chat Preferred 9a-5:30pm Office: [phone]    Dorathy Kinsman [address] [facility] 05/03/2023, 6:51 PM

## 2023-05-03 NOTE — Plan of Care (Signed)
Problem: Education: Goal: Knowledge of General Education information will improve Description: Including pain rating scale, medication(s)/side effects and non-pharmacologic comfort measures 05/03/2023 0627 by Gladis Riffle, RN Outcome: Progressing 05/02/2023 2228 by Gladis Riffle, RN Outcome: Progressing   Problem: Health Behavior/Discharge Planning: Goal: Ability to manage health-related needs will improve 05/03/2023 0627 by Gladis Riffle, RN Outcome: Progressing 05/02/2023 2228 by Gladis Riffle, RN Outcome: Progressing   Problem: Clinical Measurements: Goal: Ability to maintain clinical measurements within normal limits will improve 05/03/2023 0627 by Gladis Riffle, RN Outcome: Progressing 05/02/2023 2228 by Gladis Riffle, RN Outcome: Progressing Goal: Will remain free from infection 05/03/2023 0627 by Gladis Riffle, RN Outcome: Progressing 05/02/2023 2228 by Gladis Riffle, RN Outcome: Progressing Goal: Diagnostic test results will improve 05/03/2023 0627 by Gladis Riffle, RN Outcome: Progressing 05/02/2023 2228 by Gladis Riffle, RN Outcome: Progressing Goal: Respiratory complications will improve 05/03/2023 0627 by Gladis Riffle, RN Outcome: Progressing 05/02/2023 2228 by Gladis Riffle, RN Outcome: Progressing Goal: Cardiovascular complication will be avoided 05/03/2023 5784 by Gladis Riffle, RN Outcome: Progressing 05/02/2023 2228 by Gladis Riffle, RN Outcome: Progressing   Problem: Activity: Goal: Risk for activity intolerance will decrease 05/03/2023 0627 by Gladis Riffle, RN Outcome: Progressing 05/02/2023 2228 by Gladis Riffle, RN Outcome: Progressing   Problem: Nutrition: Goal: Adequate nutrition will be maintained 05/03/2023 0627 by Gladis Riffle, RN Outcome: Progressing 05/02/2023 2228 by Gladis Riffle, RN Outcome: Progressing   Problem: Coping: Goal: Level of anxiety will decrease 05/03/2023 0627 by Gladis Riffle, RN Outcome:  Progressing 05/02/2023 2228 by Gladis Riffle, RN Outcome: Progressing   Problem: Elimination: Goal: Will not experience complications related to bowel motility 05/03/2023 0627 by Gladis Riffle, RN Outcome: Progressing 05/02/2023 2228 by Gladis Riffle, RN Outcome: Progressing Goal: Will not experience complications related to urinary retention 05/03/2023 0627 by Gladis Riffle, RN Outcome: Progressing 05/02/2023 2228 by Gladis Riffle, RN Outcome: Progressing   Problem: Pain Managment: Goal: General experience of comfort will improve 05/03/2023 0627 by Gladis Riffle, RN Outcome: Progressing 05/02/2023 2228 by Gladis Riffle, RN Outcome: Progressing   Problem: Safety: Goal: Ability to remain free from injury will improve 05/03/2023 0627 by Gladis Riffle, RN Outcome: Progressing 05/02/2023 2228 by Gladis Riffle, RN Outcome: Progressing   Problem: Skin Integrity: Goal: Risk for impaired skin integrity will decrease 05/03/2023 0627 by Gladis Riffle, RN Outcome: Progressing 05/02/2023 2228 by Gladis Riffle, RN Outcome: Progressing   Problem: Education: Goal: Knowledge of disease or condition will improve 05/03/2023 0627 by Gladis Riffle, RN Outcome: Progressing 05/02/2023 2228 by Gladis Riffle, RN Outcome: Progressing Goal: Knowledge of secondary prevention will improve (MUST DOCUMENT ALL) 05/03/2023 0627 by Gladis Riffle, RN Outcome: Progressing 05/02/2023 2228 by Gladis Riffle, RN Outcome: Progressing Goal: Knowledge of patient specific risk factors will improve Loraine Leriche N/A or DELETE if not current risk factor) 05/03/2023 0627 by Gladis Riffle, RN Outcome: Progressing 05/02/2023 2228 by Gladis Riffle, RN Outcome: Progressing   Problem: Ischemic Stroke/TIA Tissue Perfusion: Goal: Complications of ischemic stroke/TIA will be minimized 05/03/2023 0627 by Gladis Riffle, RN Outcome: Progressing 05/02/2023 2228 by Gladis Riffle, RN Outcome: Progressing   Problem:  Coping: Goal: Will verbalize positive feelings about self 05/03/2023 0627 by Gladis Riffle, RN Outcome: Progressing 05/02/2023 2228 by Gladis Riffle, RN Outcome: Progressing Goal: Will identify appropriate support needs 05/03/2023  0454 by Gladis Riffle, RN Outcome: Progressing 05/02/2023 2228 by Gladis Riffle, RN Outcome: Progressing   Problem: Health Behavior/Discharge Planning: Goal: Ability to manage health-related needs will improve 05/03/2023 0627 by Gladis Riffle, RN Outcome: Progressing 05/02/2023 2228 by Gladis Riffle, RN Outcome: Progressing Goal: Goals will be collaboratively established with patient/family 05/03/2023 [phone] by Gladis Riffle, RN Outcome: Progressing 05/02/2023 2228 by Gladis Riffle, RN Outcome: Progressing   Problem: Self-Care: Goal: Ability to participate in self-care as condition permits will improve 05/03/2023 0627 by Gladis Riffle, RN Outcome: Progressing 05/02/2023 2228 by Gladis Riffle, RN Outcome: Progressing Goal: Verbalization of feelings and concerns over difficulty with self-care will improve 05/03/2023 0627 by Gladis Riffle, RN Outcome: Progressing 05/02/2023 2228 by Gladis Riffle, RN Outcome: Progressing Goal: Ability to communicate needs accurately will improve 05/03/2023 0627 by Gladis Riffle, RN Outcome: Progressing 05/02/2023 2228 by Gladis Riffle, RN Outcome: Progressing   Problem: Nutrition: Goal: Risk of aspiration will decrease 05/03/2023 0627 by Gladis Riffle, RN Outcome: Progressing 05/02/2023 2228 by Gladis Riffle, RN Outcome: Progressing Goal: Dietary intake will improve 05/03/2023 0627 by Gladis Riffle, RN Outcome: Progressing 05/02/2023 2228 by Gladis Riffle, RN Outcome: Progressing

## 2023-05-03 NOTE — TOC Progression Note (Incomplete)
Transition of Care [facility]) - Progression Note    Patient Details  Name: Scott Pruitt MRN: [id number] Date of Birth: 1961/09/02  Transition of [facility]) CM/SW Contact   Aris Lot, [place] Phone Number: 05/03/2023, 12:56 PM  Clinical Narrative:     CSW called [facility]  Expected Discharge Plan: Skilled Nursing Facility Barriers to Discharge: No SNF bed  Expected Discharge Plan and Services         Expected Discharge Date: 05/01/23                                     Social Determinants of Health (SDOH) Interventions SDOH Screenings   Food Insecurity: Food Insecurity Present (04/18/2023)  Housing: Low Risk  (04/18/2023)  Transportation Needs: No Transportation Needs (04/18/2023)  Utilities: Not At Risk (04/28/2023)  Depression (PHQ2-9): Medium Risk (03/11/2020)  Social Connections: Unknown (02/02/2022)   Received from [facility]  Tobacco Use: Medium Risk (04/18/2023)    Readmission Risk Interventions     No data to display

## 2023-05-04 DIAGNOSIS — S72001A Fracture of unspecified part of neck of right femur, initial encounter for closed fracture: Secondary | ICD-10-CM | POA: Diagnosis not present

## 2023-05-04 NOTE — Progress Notes (Signed)
PROGRESS NOTE  Scott Pruitt  ZOX:[id number] DOB: [birth year] DOA: 04/17/2023 PCP: Pcp, No   Brief Narrative: Patient is a 62 year old with history of HTN, HLD who presented to the hospital after a fall sustaining a right hip fracture.  Patient underwent IM nail of the right hip by Dr. Susa Simmonds on 7/25 thereafter PT OT recommended SNF.  While awaiting placement patient did have some cognitive decline prompting to obtain MRI of the brain which ended up showing acute CVA.  Neurology team was consulted.  Upon further workup patient was noted to have right lower extremity extensive DVT therefore started on anticoagulation.  Also had transcranial Dopplers with bubble study which was negative.  Patient was eventually transitioned to oral Eliquis.  Currently awaiting SNF placement.  Medically stable for discharge whenever possible.   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  Neurology team was consulted.  CTA head and neck does show some vertebral stenosis. - A1c 5.4, LDL 61 - Now on Eliquis - PT/OT-SNF - Started high-dose thiamine here, continue oral thiamine on discharge -Echo with preserved ejection fraction of 55 to 60%   Right lower extremity acute DVT Echocardiogram overall appears to be stable with preserved EF.    Now on Eliquis  Closed right hip fracture status post IM nail 7/25 ORIF by Ortho Dr. Susa Simmonds 7/25.  Follow-up outpatient recommended in around 2 weeks Initial recommendations was to be on aspirin full dose twice daily for DVT prophylaxis.  Now continue  Eliquis   Hypokalemia/hyponatremia Stable now.  Check BMP in a week   Normocytic anemia Hemoglobin overall stable around 9.8 Received a dose of IV iron, continue oral iron supplements on discharge   Vitamin D deficiency Continue high-dose regimen the supplementation    Transaminitis Resolved.  Denies alcohol use.  Viral serologies are negative   Hyperlipidemia Not on statin at present   Essential hypertension Blood pressure stable without any medication at present   Vitamin B12 deficiency Started on supplementation   3 mm pulmonary nodule - Follow-up outpatient  Constipation Continue bowel regimen     Pressure Injury 04/22/23 Sacrum Mid Stage 2 -  Partial thickness loss of dermis presenting as a shallow open injury with a red, pink wound bed without slough. blister (Active)  04/22/23 0230  Location: Sacrum  Location Orientation: Mid  Staging: Stage 2 -  Partial thickness loss of dermis presenting as a shallow open injury with a red, pink wound bed without slough.  Wound Description (Comments): blister  Present on Admission: No  Dressing Type Foam - Lift dressing to assess site every shift 05/03/23 2207    DVT prophylaxis:SCDs Start: 04/18/23 1513 SCDs Start: 04/17/23 2249 apixaban (ELIQUIS) tablet 5 mg     Code Status: Full Code  Family Communication: None at bedside  Patient status:Inpatient  Patient is from :Home  Anticipated discharge to:SNF  Estimated DC date:whenever possible   Consultants: Neurology  Procedures:None  Antimicrobials:  Anti-infectives (From admission, onward)    Start     Dose/Rate Route Frequency Ordered Stop   04/18/23 1800  ceFAZolin (ANCEF) IVPB 1 g/50 mL premix        1 g 100 mL/hr over 30 Minutes Intravenous Every 6 hours 04/18/23 1512 04/19/23 0715       Subjective: Patient seen and examined at bedside today.  Hemodynamically stable. lying on bed, looks comfortable.  No new complaints Objective: Vitals:    05/03/23 0725 05/03/23 2124 05/04/23 0513 05/04/23 0722  BP: 132/80 138/87 133/87 119/77  Pulse: 75 75 79 72  Resp: 18 18 18 16   Temp: 97.8 F (36.6 C) 98.8 F (37.1 C) 98 F (36.7 C) 98.5 F (36.9 C)  TempSrc: Oral Oral Oral   SpO2: 95% 99% 96% 98%  Weight:      Height:        Intake/Output Summary (Last 24 hours) at 05/04/2023 1150 Last data filed at 05/04/2023 0604 Gross per 24 hour  Intake --  Output 1000 ml  Net -1000 ml   Filed Weights   04/30/23 0438 05/01/23 0500 05/03/23 0500  Weight: 74.9 kg 74.4 kg 71.3 kg    Examination:  General exam: Overall comfortable, not in distress Respiratory system:  no wheezes or crackles  Cardiovascular system: S1 & S2 heard, RRR.  Gastrointestinal system: Abdomen is nondistended, soft and nontender. Central nervous system: Alert and mostly oriented Extremities: No edema, no clubbing ,no cyanosis Skin: No rashes, no ulcers,no icterus     Data Reviewed: I have personally reviewed following labs and imaging studies  CBC: Recent Labs  Lab 04/29/23 0656 04/30/23 0202 05/01/23 0204 05/02/23 0057 05/03/23 0013  WBC 10.3 9.0 8.6 8.4 8.6  HGB 9.5* 9.3* 8.7* 9.8* 9.7*  HCT 28.6* 28.2* 27.2* 30.3* 30.5*  MCV 87.5 87.3 90.1 92.9 92.1  PLT 382 385 431* 411* 477*   Basic Metabolic Panel: Recent Labs  Lab 04/29/23 0656 04/30/23 0202 05/01/23 0204 05/02/23 0057 05/03/23 0013  NA 129* 131* 130* 136 133*  K 3.9 3.8 3.8 4.1 4.1  CL 96* 99 98 99 98  CO2 26 22 25 23 27   GLUCOSE 96 89 113* 85 105*  BUN 20 18 18 17 19   CREATININE 1.05 0.79 0.91 0.86 1.03  CALCIUM 8.2* 8.2* 8.0* 8.6* 8.4*  MG 1.9 2.0 1.9 1.9 2.0  PHOS 3.6  --   --   --   --      No results found for this or any previous visit (from the past 240 hour(s)).   Radiology Studies: No results found.  Scheduled Meds:  apixaban  5 mg Oral BID   calcium carbonate  1 tablet Oral Q breakfast   cholecalciferol  1,000 Units Oral Daily   vitamin B-12  1,000 mcg Oral  Daily   docusate sodium  100 mg Oral BID   Ferrous Fumarate  1 tablet Oral Daily   multivitamin with minerals  1 tablet Oral Daily   polyethylene glycol  17 g Oral Daily   senna  1 tablet Oral BID   [START ON 05/06/2023] thiamine (VITAMIN B1) injection  100 mg Intravenous Daily   Vitamin D (Ergocalciferol)  50,000 Units Oral Q7 days   Continuous Infusions:  thiamine (VITAMIN B1) injection 250 mg (05/04/23 1133)     LOS: 17 days   Burnadette Pop, MD [facility] P8/06/2023, 11:50 AM

## 2023-05-05 DIAGNOSIS — S72001A Fracture of unspecified part of neck of right femur, initial encounter for closed fracture: Secondary | ICD-10-CM | POA: Diagnosis not present

## 2023-05-05 MED ORDER — BISACODYL 10 MG RE SUPP: 10.0000 mg | Freq: Once | RECTAL | Status: DC

## 2023-05-05 MED ORDER — POLYETHYLENE GLYCOL 3350 17 G PO PACK
17.0000 g | PACK | Freq: Two times a day (BID) | ORAL | Status: DC
  Administered 2023-05-06 – 2023-05-11 (×9): 17 g via ORAL
  Filled 2023-05-05 (×9): qty 1

## 2023-05-05 MED ORDER — LORAZEPAM 0.5 MG PO TABS
0.5000 mg | ORAL_TABLET | Freq: Four times a day (QID) | ORAL | Status: DC | PRN
  Administered 2023-05-05: 0.5 mg via ORAL
  Filled 2023-05-05: qty 1

## 2023-05-05 NOTE — Plan of Care (Signed)
Patient alert/oriented X4. Patient compliant with medication administration and showed signs of increased agitation/irritability. PRN ativan administered. Patient was calm/cooperative afterwards. Patient turned Q2 hours in bed and new PIV placed in Right forearm. VSS. Patient complains of minimal pain in R hip when moved.   Problem: Education: Goal: Knowledge of General Education information will improve Description: Including pain rating scale, medication(s)/side effects and non-pharmacologic comfort measures Outcome: Progressing   Problem: Health Behavior/Discharge Planning: Goal: Ability to manage health-related needs will improve Outcome: Progressing   Problem: Clinical Measurements: Goal: Ability to maintain clinical measurements within normal limits will improve Outcome: Progressing   Problem: Clinical Measurements: Goal: Will remain free from infection Outcome: Progressing   Problem: Clinical Measurements: Goal: Diagnostic test results will improve Outcome: Progressing   Problem: Clinical Measurements: Goal: Respiratory complications will improve Outcome: Progressing   Problem: Clinical Measurements: Goal: Cardiovascular complication will be avoided Outcome: Progressing   Problem: Activity: Goal: Risk for activity intolerance will decrease Outcome: Progressing   Problem: Nutrition: Goal: Adequate nutrition will be maintained Outcome: Progressing   Problem: Coping: Goal: Level of anxiety will decrease Outcome: Progressing   Problem: Elimination: Goal: Will not experience complications related to bowel motility Outcome: Progressing   Problem: Elimination: Goal: Will not experience complications related to urinary retention Outcome: Progressing   Problem: Pain Managment: Goal: General experience of comfort will improve Outcome: Progressing   Problem: Safety: Goal: Ability to remain free from injury will improve Outcome: Progressing   Problem: Skin  Integrity: Goal: Risk for impaired skin integrity will decrease Outcome: Progressing   Problem: Education: Goal: Knowledge of disease or condition will improve Outcome: Progressing   Problem: Education: Goal: Knowledge of secondary prevention will improve (MUST DOCUMENT ALL) Outcome: Progressing   Problem: Education: Goal: Knowledge of patient specific risk factors will improve Loraine Leriche N/A or DELETE if not current risk factor) Outcome: Progressing   Problem: Ischemic Stroke/TIA Tissue Perfusion: Goal: Complications of ischemic stroke/TIA will be minimized Outcome: Progressing   Problem: Coping: Goal: Will verbalize positive feelings about self Outcome: Progressing   Problem: Coping: Goal: Will identify appropriate support needs Outcome: Progressing   Problem: Health Behavior/Discharge Planning: Goal: Ability to manage health-related needs will improve Outcome: Progressing   Problem: Health Behavior/Discharge Planning: Goal: Goals will be collaboratively established with patient/family Outcome: Progressing   Problem: Self-Care: Goal: Ability to participate in self-care as condition permits will improve Outcome: Progressing   Problem: Self-Care: Goal: Verbalization of feelings and concerns over difficulty with self-care will improve Outcome: Progressing   Problem: Self-Care: Goal: Ability to communicate needs accurately will improve Outcome: Progressing   Problem: Nutrition: Goal: Risk of aspiration will decrease Outcome: Progressing   Problem: Nutrition: Goal: Dietary intake will improve Outcome: Progressing

## 2023-05-05 NOTE — Progress Notes (Signed)
PROGRESS NOTE  Scott Pruitt  NWG:[id number] DOB: 05-28-1961 DOA: 04/17/2023 PCP: Pcp, No   Brief Narrative: Patient is a 62 year old with history of HTN, HLD who presented to the hospital after a fall sustaining a right hip fracture.  Patient underwent IM nail of the right hip by Dr. Susa Simmonds on 7/25 thereafter PT OT recommended SNF.  While awaiting placement patient did have some cognitive decline prompting to obtain MRI of the brain which ended up showing acute CVA.  Neurology team was consulted.  Upon further workup patient was noted to have right lower extremity extensive DVT therefore started on anticoagulation.  Also had transcranial Dopplers with bubble study which was negative.  Patient was eventually transitioned to oral Eliquis.  Currently awaiting SNF placement.  Medically stable for discharge whenever possible.   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  Neurology team was consulted.  CTA head and neck does show some vertebral stenosis. - A1c 5.4, LDL 61 - Now on Eliquis - PT/OT-SNF - Started high-dose thiamine here, continue oral thiamine on discharge -Echo with preserved ejection fraction of 55 to 60%   Right lower extremity acute DVT Echocardiogram overall appears to be stable with preserved EF.    Now on Eliquis  Closed right hip fracture status post IM nail 7/25 ORIF by Ortho Dr. Susa Simmonds 7/25.  Follow-up outpatient recommended in around 2 weeks Initial recommendations was to be on aspirin full dose twice daily for DVT prophylaxis.  Now continue  Eliquis   Hypokalemia/hyponatremia Stable now.  Check BMP in a week   Normocytic anemia Hemoglobin overall stable around 9.8 Received a dose of IV iron, continue oral iron supplements on discharge   Vitamin D deficiency Continue high-dose regimen the supplementation    Transaminitis Resolved.  Denies alcohol use.  Viral serologies are negative   Hyperlipidemia Not on statin at present   Essential hypertension Blood pressure stable without any medication at present   Vitamin B12 deficiency Started on supplementation   3 mm pulmonary nodule - Follow-up outpatient  Constipation Continue bowel regimen     Pressure Injury 04/22/23 Sacrum Mid Stage 2 -  Partial thickness loss of dermis presenting as a shallow open injury with a red, pink wound bed without slough. blister (Active)  04/22/23 0230  Location: Sacrum  Location Orientation: Mid  Staging: Stage 2 -  Partial thickness loss of dermis presenting as a shallow open injury with a red, pink wound bed without slough.  Wound Description (Comments): blister  Present on Admission: No  Dressing Type Foam - Lift dressing to assess site every shift 05/04/23 2143    DVT prophylaxis:SCDs Start: 04/18/23 1513 SCDs Start: 04/17/23 2249 apixaban (ELIQUIS) tablet 5 mg     Code Status: Full Code  Family Communication: None at bedside  Patient status:Inpatient  Patient is from :Home  Anticipated discharge to:SNF  Estimated DC date:whenever possible   Consultants: Neurology  Procedures:None  Antimicrobials:  Anti-infectives (From admission, onward)    Start     Dose/Rate Route Frequency Ordered Stop   04/18/23 1800  ceFAZolin (ANCEF) IVPB 1 g/50 mL premix        1 g 100 mL/hr over 30 Minutes Intravenous Every 6 hours 04/18/23 1512 04/19/23 0715       Subjective: Patient seen and examined at bedside today.  Hemodynamically stable.  Comfortable.  Lying on bed like yesterday.  No new complaints    objective: Vitals:   05/05/23 0445 05/05/23 0500 05/05/23 0740 05/05/23 0740  BP: (!) 134/94  122/76 122/76  Pulse: 74  81 79  Resp: 18  16 16   Temp: 98.4 F (36.9 C)  97.9 F (36.6 C) 97.9 F (36.6 C)  TempSrc: Oral  Oral Oral  SpO2: 99%  98% 98%  Weight:  70.5 kg    Height:        Intake/Output Summary (Last 24 hours) at 05/05/2023 1156 Last data filed at 05/05/2023 0800 Gross per 24 hour  Intake 240 ml  Output 450 ml  Net -210 ml   Filed Weights   05/03/23 0500 05/04/23 0500 05/05/23 0500  Weight: 71.3 kg 70.5 kg 70.5 kg    Examination:  General exam: Overall comfortable, not in distress, appears weak and deconditioned Respiratory system:  no wheezes or crackles  Cardiovascular system: S1 & S2 heard, RRR.  Gastrointestinal system: Abdomen is nondistended, soft and nontender. Central nervous system: Alert and oriented Extremities: No edema, no clubbing ,no cyanosis Skin: No rashes, no ulcers,no icterus     Data Reviewed: I have personally reviewed following labs and imaging studies  CBC: Recent Labs  Lab 04/29/23 0656 04/30/23 0202 05/01/23 0204 05/02/23 0057 05/03/23 0013  WBC 10.3 9.0 8.6 8.4 8.6  HGB 9.5* 9.3* 8.7* 9.8* 9.7*  HCT 28.6* 28.2* 27.2* 30.3* 30.5*  MCV 87.5 87.3 90.1 92.9 92.1  PLT 382 385 431* 411* 477*   Basic Metabolic Panel: Recent Labs  Lab 04/29/23 0656 04/30/23 0202 05/01/23 0204 05/02/23 0057 05/03/23 0013  NA 129* 131* 130* 136 133*  K 3.9 3.8 3.8 4.1 4.1  CL 96* 99 98 99 98  CO2 26 22 25 23 27   GLUCOSE 96 89 113* 85 105*  BUN 20 18 18 17 19   CREATININE 1.05 0.79 0.91 0.86 1.03  CALCIUM 8.2* 8.2* 8.0* 8.6* 8.4*  MG 1.9 2.0 1.9 1.9 2.0  PHOS 3.6  --   --   --   --      No results found for this or any previous visit (from the past 240 hour(s)).   Radiology Studies: No results found.  Scheduled Meds:  apixaban  5 mg Oral BID   bisacodyl  10 mg Rectal Once   calcium carbonate  1 tablet Oral Q breakfast    cholecalciferol  1,000 Units Oral Daily   vitamin B-12  1,000 mcg Oral Daily   docusate sodium  100 mg Oral BID   Ferrous Fumarate  1 tablet Oral Daily   multivitamin with minerals  1 tablet Oral Daily   polyethylene glycol  17 g Oral BID   senna  1 tablet Oral BID   [START ON 05/06/2023] thiamine (VITAMIN B1) injection  100 mg Intravenous Daily   Vitamin D (Ergocalciferol)  50,000 Units Oral Q7 days   Continuous Infusions:     LOS: 18 days   Burnadette Pop, MD [facility] P8/07/2023, 11:56 AM

## 2023-05-06 DIAGNOSIS — S72001A Fracture of unspecified part of neck of right femur, initial encounter for closed fracture: Secondary | ICD-10-CM | POA: Diagnosis not present

## 2023-05-06 MED ORDER — LORAZEPAM 0.5 MG PO TABS
0.5000 mg | ORAL_TABLET | Freq: Once | ORAL | Status: AC
  Administered 2023-05-06: 0.5 mg via ORAL
  Filled 2023-05-06: qty 1

## 2023-05-06 NOTE — TOC Progression Note (Addendum)
Transition of Care [facility]) - Progression Note    Patient Details  Name: Scott Pruitt MRN: [id number] Date of Birth: [birth year]  Transition of Care [facility]) CM/SW Contact  Erin Sons, [place] Phone Number: 05/06/2023, 1:33 PM  Clinical Narrative:     CSW called Becky in admissions at [facility] , cell: [phone]. They have a bed available tomorrow for pt pending insurance approval. Kriste Basque will initiate SNF authorization once she receives updated clinicals. CSW faxed updated clinicals to [phone]. CSW called and updated pt's S/O.  CSW contacted [facility]; they are not in network with [facility] and cannot transport pt at DC.   CSW called [facility]; they are willing to transport with pt's insurance but would require LOG from [facility]. [facility] faxed CSW a LOG to be completed and returned.    Expected Discharge Plan: Skilled Nursing Facility Barriers to Discharge: Insurance Authorization  Expected Discharge Plan and Services         Expected Discharge Date: 05/01/23                                     Social Determinants of Health (SDOH) Interventions SDOH Screenings   Food Insecurity: Food Insecurity Present (04/18/2023)  Housing: Low Risk  (04/18/2023)  Transportation Needs: No Transportation Needs (04/18/2023)  Utilities: Not At Risk (04/28/2023)  Depression (PHQ2-9): Medium Risk (03/11/2020)  Social Connections: Unknown (02/02/2022)   Received from [facility]  Tobacco Use: Medium Risk (04/18/2023)    Readmission Risk Interventions     No data to display

## 2023-05-06 NOTE — Progress Notes (Signed)
Occupational Therapy Treatment Patient Details Name: Scott Pruitt MRN: [id number] DOB: Aug 23, 1961 Today's Date: 05/06/2023   History of present illness 62 y.o. male [facility] with acute right hip fracture after ground-level mechanical fall after presenting from home to [facility] ED complaining of right hip pain. He is s/p nailing of R hip fx 04/18/23. Past medical history significant for essential hypertension, hyperlipidemia. 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 MRI also notable for chronic small vessel disease.   OT comments  Pt continues to be limited by RLE pain and weakness. During session pt not able to offload RLE and also struggles with his LLE. He is able to plantar flex his RLE while sitting but not able to get it off ground. He needs Max A for close stand pivots and becomes restless/irritated with attempts to progress with mobility. OT to continue to progress pt as able, DC plans remain appropriate for SNF      If plan is discharge home, recommend the following:  A lot of help with walking and/or transfers;A lot of help with bathing/dressing/bathroom;Assistance with cooking/housework;Assist for transportation;Help with stairs or ramp for entrance;Direct supervision/assist for financial management;Direct supervision/assist for medications management   Equipment Recommendations  Other (comment) (defer)    Recommendations for Other Services      Precautions / Restrictions Precautions Precautions: Fall Precaution Comments: bowel/bladder incontinence Restrictions Weight Bearing Restrictions: Yes RLE Weight Bearing: Weight bearing as tolerated       Mobility Bed Mobility Overal bed mobility: Needs Assistance Bed Mobility: Supine to Sit     Supine to sit: +2 for physical assistance, Mod assist      General bed mobility comments: Pt needing assist with RLE movement, he guards it. Min A to help scoot anteriorly    Transfers Overall transfer level: Needs assistance Equipment used: Rolling walker (2 wheels) Transfers: Bed to chair/wheelchair/BSC, Sit to/from Stand Sit to Stand: Mod assist     Step pivot transfers: Max assist     General transfer comment: Pt needing cues for hand placement, while standing OT continued to provide hip repositioning and cues to slide pt heels back to gather balance. Pt refusing to initiate any steps stating "I can't" despite prolonged stanind and multiple cues. Pt then pivoted to recliner Max A     Balance Overall balance assessment: Needs assistance Sitting-balance support: Feet supported Sitting balance-Leahy Scale: Poor     Standing balance support: Bilateral upper extremity supported Standing balance-Leahy Scale: Poor Standing balance comment: Pt reliant on therapist and RW support                           ADL either performed or assessed with clinical judgement   ADL Overall ADL's : Needs assistance/impaired                         Toilet Transfer: Maximal assistance;Stand-pivot;BSC/3in1 Toilet Transfer Details (indicate cue type and reason): cues and OT facilitation to reposition RLE to avoid dinging it against bed           General ADL Comments: Focused session on progressig OOB mobility, pt very pain limited and not producing steps at this time    Extremity/Trunk Assessment  Vision       Perception     Praxis      Cognition Arousal: Alert Behavior During Therapy: Flat affect Overall Cognitive Status: No family/caregiver present to determine baseline cognitive functioning                                 General Comments: Follows simple step commands with increased time        Exercises      Shoulder Instructions       General Comments pt bed pad damp and  gown down, NT notified he needs full body bath; pt had just transferred to chair from bed at start of session and linens on bed damp so these were removed and RN arriving to room to change linens during session.    Pertinent Vitals/ Pain       Pain Assessment Pain Assessment: Faces Faces Pain Scale: Hurts even more Pain Location: R hip/leg and bottom Pain Descriptors / Indicators: Discomfort, Grimacing, Guarding Pain Intervention(s): Repositioned, Monitored during session, Limited activity within patient's tolerance  Home Living                                          Prior Functioning/Environment              Frequency  Min 1X/week        Progress Toward Goals  OT Goals(current goals can now be found in the care plan section)  Progress towards OT goals: Progressing toward goals  Acute Rehab OT Goals Patient Stated Goal: Get R leg better OT Goal Formulation: With patient Time For Goal Achievement: 05/10/23 Potential to Achieve Goals: Good  Plan Discharge plan remains appropriate;Frequency remains appropriate    Co-evaluation                 AM-PAC OT "6 Clicks" Daily Activity     Outcome Measure   Help from another person eating meals?: A Little Help from another person taking care of personal grooming?: A Little Help from another person toileting, which includes using toliet, bedpan, or urinal?: A Lot Help from another person bathing (including washing, rinsing, drying)?: A Lot Help from another person to put on and taking off regular upper body clothing?: A Little Help from another person to put on and taking off regular lower body clothing?: A Lot 6 Click Score: 15    End of Session Equipment Utilized During Treatment: Gait belt;Rolling walker (2 wheels)  OT Visit Diagnosis: Unsteadiness on feet (R26.81);Other abnormalities of gait and mobility (R26.89);Muscle weakness (generalized) (M62.81);Pain Pain - Right/Left: Right Pain -  part of body: Hip;Leg   Activity Tolerance Patient limited by pain   Patient Left with call bell/phone within reach;in chair;with chair alarm set   Nurse Communication Mobility status        Time: 1610-9604 OT Time Calculation (min): 30 min  Charges: OT General Charges $OT Visit: 1 Visit OT Treatments $Therapeutic Activity: 23-37 mins  05/06/2023  AB, OTR/L  Acute Rehabilitation Services  Office: [phone]   Tristan Schroeder 05/06/2023, 2:25 PM

## 2023-05-06 NOTE — Plan of Care (Signed)
Patient alert/oriented X4. Patient compliant with medication administration and was up in the chair for a few hours. Patient turned Q2 hours and wound dressings remain intact. Patient VSS and has no complaints at this time.  Problem: Education: Goal: Knowledge of General Education information will improve Description: Including pain rating scale, medication(s)/side effects and non-pharmacologic comfort measures Outcome: Progressing   Problem: Health Behavior/Discharge Planning: Goal: Ability to manage health-related needs will improve Outcome: Progressing   Problem: Clinical Measurements: Goal: Ability to maintain clinical measurements within normal limits will improve Outcome: Progressing   Problem: Clinical Measurements: Goal: Will remain free from infection Outcome: Progressing   Problem: Clinical Measurements: Goal: Diagnostic test results will improve Outcome: Progressing   Problem: Clinical Measurements: Goal: Respiratory complications will improve Outcome: Progressing   Problem: Clinical Measurements: Goal: Cardiovascular complication will be avoided Outcome: Progressing   Problem: Activity: Goal: Risk for activity intolerance will decrease Outcome: Progressing   Problem: Nutrition: Goal: Adequate nutrition will be maintained Outcome: Progressing   Problem: Coping: Goal: Level of anxiety will decrease Outcome: Progressing   Problem: Elimination: Goal: Will not experience complications related to bowel motility Outcome: Progressing   Problem: Elimination: Goal: Will not experience complications related to urinary retention Outcome: Progressing   Problem: Pain Managment: Goal: General experience of comfort will improve Outcome: Progressing   Problem: Safety: Goal: Ability to remain free from injury will improve Outcome: Progressing   Problem: Skin Integrity: Goal: Risk for impaired skin integrity will decrease Outcome: Progressing   Problem:  Education: Goal: Knowledge of disease or condition will improve Outcome: Progressing   Problem: Education: Goal: Knowledge of secondary prevention will improve (MUST DOCUMENT ALL) Outcome: Progressing   Problem: Education: Goal: Knowledge of patient specific risk factors will improve Loraine Leriche N/A or DELETE if not current risk factor) Outcome: Progressing   Problem: Ischemic Stroke/TIA Tissue Perfusion: Goal: Complications of ischemic stroke/TIA will be minimized Outcome: Progressing   Problem: Coping: Goal: Will verbalize positive feelings about self Outcome: Progressing   Problem: Coping: Goal: Will identify appropriate support needs Outcome: Progressing   Problem: Health Behavior/Discharge Planning: Goal: Ability to manage health-related needs will improve Outcome: Progressing   Problem: Health Behavior/Discharge Planning: Goal: Goals will be collaboratively established with patient/family Outcome: Progressing   Problem: Self-Care: Goal: Ability to participate in self-care as condition permits will improve Outcome: Progressing   Problem: Self-Care: Goal: Verbalization of feelings and concerns over difficulty with self-care will improve Outcome: Progressing   Problem: Self-Care: Goal: Ability to communicate needs accurately will improve Outcome: Progressing   Problem: Nutrition: Goal: Risk of aspiration will decrease Outcome: Progressing   Problem: Nutrition: Goal: Dietary intake will improve Outcome: Progressing

## 2023-05-06 NOTE — Progress Notes (Signed)
Physical Therapy Treatment Patient Details Name: Scott Pruitt MRN: [id number] DOB: [birth year] Today's Date: 05/06/2023   History of Present Illness 62 y.o. male admitted to [facility] on 04/17/2023 with acute right hip fracture after ground-level mechanical fall after presenting from home to [facility] ED complaining of right hip pain. He is s/p nailing of R hip fx 04/18/23. Past medical history significant for essential hypertension, hyperlipidemia. 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 MRI also notable for chronic small vessel disease.    PT Comments  Pt received in recliner after recently working with OT, pt agreeable to therapy session and with good participation in supine/seated LE strengthening/ROM exercises and fair tolerance for transfer training. Pt limited due to pain in stance in R hip, RN notified. Pt needing up to maxA for sit<>stand but did perform with modA on second trial to RW. Pt with difficulty with weight shifting in stance due to c/o R hip pain. Pt agreeable to sit up in recliner at end of session. Recommend Stedy vs +2 HHA dependent pivot using draw sheet for return transfers. Supervising PT notified pt due for goal update next session.     If plan is discharge home, recommend the following: Two people to help with walking and/or transfers;A lot of help with bathing/dressing/bathroom;Assistance with cooking/housework;Direct supervision/assist for medications management;Assist for transportation;Help with stairs or ramp for entrance   Can travel by private vehicle     No  Equipment Recommendations  Other (comment)    Recommendations for Other Services       Precautions / Restrictions Precautions Precautions: Fall Precaution Comments: bowel/bladder incontinence Restrictions Weight Bearing Restrictions: Yes RLE Weight  Bearing: Weight bearing as tolerated     Mobility  Bed Mobility Overal bed mobility: Needs Assistance             General bed mobility comments: pt received in recliner after working with OT    Transfers Overall transfer level: Needs assistance Equipment used: Rolling walker (2 wheels) Transfers: Sit to/from Stand Sit to Stand: Max assist, Mod assist   Step pivot transfers: +2 physical assistance, Max assist, From elevated surface       General transfer comment: Max A for STS due to need for heavy cueing for hand placement, facilitation of anterior weight shift, and rise. Max multimodal cues for facilitation of upright posture in standing as pt with flexed posture throughout. Pt c/o increased pain in stance and unable to progress gait away from chair, did practice some pre-gait tasks, see below    Ambulation/Gait Ambulation/Gait assistance: Max assist   Assistive device: Rolling walker (2 wheels)       Pre-gait activities: cues for LLE hip flexion, pt able to take ~2 shuffled side step toward his L side but needs manual assist to step with RLE via slide assist. Pt unable to perform RLE hip flexion/lift foot off floor due to c/o pain and fatigue.     Stairs             Wheelchair Mobility     Tilt Bed    Modified Rankin (Stroke Patients Only)       Balance Overall balance assessment: Needs assistance Sitting-balance support: Feet supported Sitting balance-Leahy Scale: Poor Sitting balance - Comments: tending to lean back on chair support Postural control: Posterior lean Standing balance support: Bilateral upper extremity  supported Standing balance-Leahy Scale: Poor Standing balance comment: Pt reliant on therapist and RW support                            Cognition Arousal: Alert Behavior During Therapy: Flat affect Overall Cognitive Status: No family/caregiver present to determine baseline cognitive functioning                                  General Comments: Pt following one step commands with increased time. Pt needs multimodal cues for unfamiliar/2-step technique (advancing hips in chair with lateral leans, STS with R leg pain).        Exercises Other Exercises Other Exercises: seated BLE AROM: ankle pumps, heel slides, hip abduction (AA) x10 reps ea Other Exercises: standing LLE AROM: hip flexion x5 reps (ROM limited due to RLE pain in SLS) at [facility] Other Exercises: static standing for strengthening    General Comments General comments (skin integrity, edema, etc.): pt bed pad damp and gown down, NT notified he needs full body bath; pt had just transferred to chair from bed at start of session and linens on bed damp so these were removed and RN arriving to room to change linens during session.      Pertinent Vitals/Pain Pain Assessment Pain Assessment: 0-10 Pain Score: 7  Pain Location: R hip/leg and bottom Pain Descriptors / Indicators: Discomfort, Grimacing, Guarding Pain Intervention(s): Limited activity within patient's tolerance, Monitored during session, Repositioned, Patient requesting pain meds-RN notified, Ice applied    Home Living                          Prior Function            PT Goals (current goals can now be found in the care plan section) Acute Rehab PT Goals Patient Stated Goal: none stated PT Goal Formulation: Patient unable to participate in goal setting Time For Goal Achievement: 05/03/23 (PT notified pt progress toward goals) Progress towards PT goals: Progressing toward goals    Frequency    Min 1X/week      PT Plan Current plan remains appropriate    Co-evaluation              AM-PAC PT "6 Clicks" Mobility   Outcome Measure  Help needed turning from your back to your side while in a flat bed without using bedrails?: A Lot Help needed moving from lying on your back to sitting on the side of a flat bed without using bedrails?: A  Lot Help needed moving to and from a bed to a chair (including a wheelchair)?: Total Help needed standing up from a chair using your arms (e.g., wheelchair or bedside chair)?: A Lot Help needed to walk in hospital room?: Total Help needed climbing 3-5 steps with a railing? : Total 6 Click Score: 9    End of Session Equipment Utilized During Treatment: Gait belt Activity Tolerance: Patient limited by fatigue;Patient limited by pain Patient left: in chair;with call bell/phone within reach;with chair alarm set;Other (comment) (heels floated) Nurse Communication: Mobility status;Patient requests pain meds;Need for lift equipment;Other (comment) (recommend use Stedy (may need to borrow from 5N or 37M units) vs +2 pivot with HHA/RW) PT Visit Diagnosis: Other abnormalities of gait and mobility (R26.89);Muscle weakness (generalized) (M62.81);Pain Pain - Right/Left: Right Pain - part of body: Hip;Leg  Time: 8469-6295 PT Time Calculation (min) (ACUTE ONLY): 23 min  Charges:    $Therapeutic Exercise: 8-22 mins $Therapeutic Activity: 8-22 mins PT General Charges $$ ACUTE PT VISIT: 1 Visit                      P., PTA Acute Rehabilitation Services Secure Chat Preferred 9a-5:30pm Office: [phone]    Dorathy Kinsman [facility] 05/06/2023, 12:13 PM

## 2023-05-06 NOTE — Progress Notes (Signed)
PROGRESS NOTE  Scott Pruitt  ZOX:[id number] DOB: 1961/02/03 DOA: 04/17/2023 PCP: Pcp, No   Brief Narrative: Patient is a 62 year old with history of HTN, HLD who presented to the hospital after a fall sustaining a right hip fracture.  Patient underwent IM nail of the right hip by Dr. Susa Simmonds on 7/25 thereafter PT OT recommended SNF.  While awaiting placement patient did have some cognitive decline prompting to obtain MRI of the brain which ended up showing acute CVA.  Neurology team was consulted.  Upon further workup patient was noted to have right lower extremity extensive DVT therefore started on anticoagulation.  Also had transcranial Dopplers with bubble study which was negative.  Patient was eventually transitioned to oral Eliquis.  Currently awaiting SNF placement.  Medically stable for discharge whenever possible.   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  Neurology team was consulted.  CTA head and neck does show some vertebral stenosis. - A1c 5.4, LDL 61 - Now on Eliquis - PT/OT-SNF - Started high-dose thiamine here, continue oral thiamine on discharge -Echo with preserved ejection fraction of 55 to 60%   Right lower extremity acute DVT Echocardiogram overall appears to be stable with preserved EF.    Now on Eliquis  Closed right hip fracture status post IM nail 7/25 ORIF by Ortho Dr. Susa Simmonds 7/25.  Follow-up outpatient recommended in around 2 weeks Initial recommendations was to be on aspirin full dose twice daily for DVT prophylaxis.  Now continue  Eliquis   Hypokalemia/hyponatremia Stable now.     Normocytic anemia Hemoglobin overall stable around 9.8 Received a dose of IV iron, continue oral iron supplements on discharge   Vitamin D deficiency Continue high-dose regimen the supplementation    Transaminitis Resolved.  Denies alcohol use.  Viral serologies are negative   Hyperlipidemia Not on statin at present   Essential hypertension Blood pressure stable without any medication at present   Vitamin B12 deficiency Started on supplementation   3 mm pulmonary nodule - Follow-up outpatient  Constipation Continue bowel regimen     Pressure Injury 04/22/23 Sacrum Mid Stage 2 -  Partial thickness loss of dermis presenting as a shallow open injury with a red, pink wound bed without slough. blister (Active)  04/22/23 0230  Location: Sacrum  Location Orientation: Mid  Staging: Stage 2 -  Partial thickness loss of dermis presenting as a shallow open injury with a red, pink wound bed without slough.  Wound Description (Comments): blister  Present on Admission: No  Dressing Type Foam - Lift dressing to assess site every shift 05/05/23 0800    DVT prophylaxis:SCDs Start: 04/18/23 1513 SCDs Start: 04/17/23 2249 apixaban (ELIQUIS) tablet 5 mg     Code Status: Full Code  Family Communication: None at bedside  Patient status:Inpatient  Patient is from :Home  Anticipated discharge to:SNF  Estimated DC date:whenever possible   Consultants: Neurology  Procedures:None  Antimicrobials:  Anti-infectives (From admission, onward)    Start     Dose/Rate Route Frequency Ordered Stop   04/18/23 1800  ceFAZolin (ANCEF) IVPB 1  g/50 mL premix        1 g 100 mL/hr over 30 Minutes Intravenous Every 6 hours 04/18/23 1512 04/19/23 0715       Subjective: Patient seen and examined at bedside today.  Hemodynamically stable.  Overall comfortable, lying on bed.  He was reported to be agitated yesterday afternoon but this  morning was, comfortable.  objective: Vitals:   05/05/23 1551 05/05/23 1933 05/06/23 0315 05/06/23 0733  BP: 129/78 129/80 (!) 140/88 (!) 146/86  Pulse: 71 73 73 85  Resp: 16 18 18 18   Temp: 98.1 F (36.7 C) 98.9 F (37.2 C) 98 F (36.7 C) 98 F (36.7 C)  TempSrc: Oral Oral    SpO2: 99% 98% 99% 99%  Weight:      Height:        Intake/Output Summary (Last 24 hours) at 05/06/2023 1126 Last data filed at 05/06/2023 1059 Gross per 24 hour  Intake 120 ml  Output 1450 ml  Net -1330 ml   Filed Weights   05/03/23 0500 05/04/23 0500 05/05/23 0500  Weight: 71.3 kg 70.5 kg 70.5 kg    Examination:  General exam: Overall comfortable, not in distress,appears weak and deconditioned Respiratory system:  no wheezes or crackles  Cardiovascular system: S1 & S2 heard, RRR.  Gastrointestinal system: Abdomen is nondistended, soft and nontender. Central nervous system: Alert and oriented Extremities: No edema, no clubbing ,no cyanosis Skin: No rashes, no ulcers,no icterus      Data Reviewed: I have personally reviewed following labs and imaging studies  CBC: Recent Labs  Lab 04/30/23 0202 05/01/23 0204 05/02/23 0057 05/03/23 0013  WBC 9.0 8.6 8.4 8.6  HGB 9.3* 8.7* 9.8* 9.7*  HCT 28.2* 27.2* 30.3* 30.5*  MCV 87.3 90.1 92.9 92.1  PLT 385 431* 411* 477*   Basic Metabolic Panel: Recent Labs  Lab 04/30/23 0202 05/01/23 0204 05/02/23 0057 05/03/23 0013  NA 131* 130* 136 133*  K 3.8 3.8 4.1 4.1  CL 99 98 99 98  CO2 22 25 23 27   GLUCOSE 89 113* 85 105*  BUN 18 18 17 19   CREATININE 0.79 0.91 0.86 1.03  CALCIUM 8.2* 8.0* 8.6* 8.4*  MG 2.0 1.9 1.9 2.0     No results found for this or any previous visit (from the past 240 hour(s)).   Radiology Studies: No results found.  Scheduled Meds:  apixaban  5 mg Oral BID   bisacodyl  10 mg Rectal Once   calcium carbonate  1 tablet Oral Q breakfast   cholecalciferol  1,000 Units Oral Daily   vitamin B-12  1,000 mcg Oral Daily    docusate sodium  100 mg Oral BID   Ferrous Fumarate  1 tablet Oral Daily   multivitamin with minerals  1 tablet Oral Daily   polyethylene glycol  17 g Oral BID   senna  1 tablet Oral BID   thiamine (VITAMIN B1) injection  100 mg Intravenous Daily   Vitamin D (Ergocalciferol)  50,000 Units Oral Q7 days   Continuous Infusions:     LOS: 19 days   Burnadette Pop, MD [facility] P8/08/2023, 11:26 AM

## 2023-05-07 DIAGNOSIS — S72001A Fracture of unspecified part of neck of right femur, initial encounter for closed fracture: Secondary | ICD-10-CM | POA: Diagnosis not present

## 2023-05-07 MED ORDER — THIAMINE MONONITRATE 100 MG PO TABS
100.0000 mg | ORAL_TABLET | Freq: Every day | ORAL | Status: DC
  Administered 2023-05-08 – 2023-05-11 (×4): 100 mg via ORAL
  Filled 2023-05-07 (×4): qty 1

## 2023-05-07 NOTE — Plan of Care (Signed)
Patient alert/oriented X4. Patient compliant with medication administration and oxycodone administered for R hip pain. Patient was turned Q2 hours in bed. VSS. Dressings remain intact.  Problem: Education: Goal: Knowledge of General Education information will improve Description: Including pain rating scale, medication(s)/side effects and non-pharmacologic comfort measures Outcome: Progressing   Problem: Health Behavior/Discharge Planning: Goal: Ability to manage health-related needs will improve Outcome: Progressing   Problem: Clinical Measurements: Goal: Ability to maintain clinical measurements within normal limits will improve Outcome: Progressing   Problem: Clinical Measurements: Goal: Will remain free from infection Outcome: Progressing   Problem: Clinical Measurements: Goal: Diagnostic test results will improve Outcome: Progressing   Problem: Clinical Measurements: Goal: Respiratory complications will improve Outcome: Progressing   Problem: Clinical Measurements: Goal: Cardiovascular complication will be avoided Outcome: Progressing   Problem: Activity: Goal: Risk for activity intolerance will decrease Outcome: Progressing   Problem: Nutrition: Goal: Adequate nutrition will be maintained Outcome: Progressing   Problem: Coping: Goal: Level of anxiety will decrease Outcome: Progressing   Problem: Elimination: Goal: Will not experience complications related to bowel motility Outcome: Progressing   Problem: Pain Managment: Goal: General experience of comfort will improve Outcome: Progressing   Problem: Safety: Goal: Ability to remain free from injury will improve Outcome: Progressing   Problem: Skin Integrity: Goal: Risk for impaired skin integrity will decrease Outcome: Progressing   Problem: Education: Goal: Knowledge of disease or condition will improve Outcome: Progressing   Problem: Education: Goal: Knowledge of secondary prevention will improve  (MUST DOCUMENT ALL) Outcome: Progressing   Problem: Education: Goal: Knowledge of patient specific risk factors will improve Loraine Leriche N/A or DELETE if not current risk factor) Outcome: Progressing   Problem: Ischemic Stroke/TIA Tissue Perfusion: Goal: Complications of ischemic stroke/TIA will be minimized Outcome: Progressing   Problem: Coping: Goal: Will verbalize positive feelings about self Outcome: Progressing   Problem: Coping: Goal: Will identify appropriate support needs Outcome: Progressing   Problem: Health Behavior/Discharge Planning: Goal: Ability to manage health-related needs will improve Outcome: Progressing   Problem: Health Behavior/Discharge Planning: Goal: Goals will be collaboratively established with patient/family Outcome: Progressing   Problem: Self-Care: Goal: Ability to participate in self-care as condition permits will improve Outcome: Progressing   Problem: Self-Care: Goal: Verbalization of feelings and concerns over difficulty with self-care will improve Outcome: Progressing   Problem: Self-Care: Goal: Ability to communicate needs accurately will improve Outcome: Progressing   Problem: Nutrition: Goal: Risk of aspiration will decrease Outcome: Progressing   Problem: Nutrition: Goal: Dietary intake will improve Outcome: Progressing

## 2023-05-07 NOTE — Progress Notes (Signed)
PROGRESS NOTE  Scott Pruitt  GEX:[id number] DOB: 16-Dec-1960 DOA: 04/17/2023 PCP: Pcp, No   Brief Narrative: Patient is a 62 year old with history of HTN, HLD who presented to the hospital after a fall sustaining a right hip fracture.  Patient underwent IM nail of the right hip by Dr. Susa Simmonds on 7/25 thereafter PT OT recommended SNF.  While awaiting placement patient did have some cognitive decline prompting to obtain MRI of the brain which ended up showing acute CVA.  Neurology team was consulted.  Upon further workup patient was noted to have right lower extremity extensive DVT therefore started on anticoagulation.  Also had transcranial Dopplers with bubble study which was negative.  Patient was eventually transitioned to oral Eliquis.  Currently awaiting SNF placement.  Medically stable for discharge whenever possible.   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  Neurology team was consulted.  CTA head and neck does show some vertebral stenosis. - A1c 5.4, LDL 61 - Now on Eliquis - PT/OT-SNF - Started high-dose thiamine here, continue oral thiamine on discharge -Echo with preserved ejection fraction of 55 to 60%   Right lower extremity acute DVT Echocardiogram overall appears to be stable with preserved EF.    Now on Eliquis  Closed right hip fracture status post IM nail 7/25 ORIF by Ortho Dr. Susa Simmonds 7/25.  Follow-up outpatient recommended in around 2 weeks Initial recommendations was to be on aspirin full dose twice daily for DVT prophylaxis.  Now continue  Eliquis   Hypokalemia/hyponatremia Stable now.     Normocytic anemia Hemoglobin overall stable around 9.8 Received a dose of IV iron, continue oral iron supplements on discharge   Vitamin D deficiency Continue high-dose regimen the supplementation    Transaminitis Resolved.  Denies alcohol use.  Viral serologies are negative   Hyperlipidemia Not on statin at present   Essential hypertension Blood pressure stable without any medication at present   Vitamin B12 deficiency Started on supplementation   3 mm pulmonary nodule - Follow-up outpatient  Constipation Continue bowel regimen     Pressure Injury 04/22/23 Sacrum Mid Stage 2 -  Partial thickness loss of dermis presenting as a shallow open injury with a red, pink wound bed without slough. blister (Active)  04/22/23 0230  Location: Sacrum  Location Orientation: Mid  Staging: Stage 2 -  Partial thickness loss of dermis presenting as a shallow open injury with a red, pink wound bed without slough.  Wound Description (Comments): blister  Present on Admission: No  Dressing Type Foam - Lift dressing to assess site every shift 05/07/23 0800    DVT prophylaxis:SCDs Start: 04/18/23 1513 SCDs Start: 04/17/23 2249 apixaban (ELIQUIS) tablet 5 mg     Code Status: Full Code  Family Communication: Called and discussed with significant other [name] Penn on 8/13  Patient status:Inpatient  Patient is from :Home  Anticipated discharge to:SNF  Estimated DC date:whenever possible   Consultants: Neurology  Procedures:None  Antimicrobials:  Anti-infectives (From admission, onward)    Start     Dose/Rate Route Frequency Ordered Stop  04/18/23 1800  ceFAZolin (ANCEF) IVPB 1 g/50 mL premix        1 g 100 mL/hr over 30 Minutes Intravenous Every 6 hours 04/18/23 1512 04/19/23 0715       Subjective: Patient seen and examined at bedside today.  Hemodynamically stable.  Lying comfortably in bed.  No acute issues.   Medically stable for discharge whenever possible   objective: Vitals:   05/06/23 1524 05/06/23 1956 05/07/23 0352 05/07/23 0720  BP: 131/80 138/82 (!) 147/88 125/87  Pulse: 81 73 70 73  Resp: 20 18 18 18   Temp: 98.6 F (37 C) 98.4 F (36.9 C) 98.3 F (36.8 C) 97.9 F (36.6 C)  TempSrc:  Oral Oral Oral  SpO2: 98% 99% 98% 96%  Weight:      Height:        Intake/Output Summary (Last 24 hours) at 05/07/2023 1327 Last data filed at 05/07/2023 0353 Gross per 24 hour  Intake --  Output 1100 ml  Net -1100 ml   Filed Weights   05/03/23 0500 05/04/23 0500 05/05/23 0500  Weight: 71.3 kg 70.5 kg 70.5 kg    Examination:   General exam: Overall comfortable, not in distress, appears deconditioned/ debilitated Respiratory system:  no wheezes or crackles  Cardiovascular system: S1 & S2 heard, RRR.  Gastrointestinal system: Abdomen is nondistended, soft and nontender. Central nervous system: Alert and oriented Extremities: No edema, no clubbing ,no cyanosis Skin: No rashes, no ulcers,no icterus        Data Reviewed: I have personally reviewed following labs and imaging studies  CBC: Recent Labs  Lab 05/01/23 0204 05/02/23 0057 05/03/23 0013  WBC 8.6 8.4 8.6  HGB 8.7* 9.8* 9.7*  HCT 27.2* 30.3* 30.5*  MCV 90.1 92.9 92.1  PLT 431* 411* 477*   Basic Metabolic Panel: Recent Labs  Lab 05/01/23 0204 05/02/23 0057 05/03/23 0013  NA 130* 136 133*  K 3.8 4.1 4.1  CL 98 99 98  CO2 25 23 27   GLUCOSE 113* 85 105*  BUN 18 17 19   CREATININE 0.91 0.86 1.03  CALCIUM 8.0* 8.6* 8.4*  MG 1.9 1.9 2.0     No results found for this or any previous visit (from the past 240 hour(s)).   Radiology Studies: No results found.  Scheduled Meds:  apixaban  5 mg Oral BID   bisacodyl  10 mg Rectal Once   calcium carbonate  1 tablet Oral Q breakfast   cholecalciferol  1,000 Units Oral Daily   vitamin B-12  1,000 mcg Oral Daily   docusate sodium  100 mg Oral BID   Ferrous Fumarate  1  tablet Oral Daily   multivitamin with minerals  1 tablet Oral Daily   polyethylene glycol  17 g Oral BID   senna  1 tablet Oral BID   [START ON 05/08/2023] thiamine  100 mg Oral Daily   Vitamin D (Ergocalciferol)  50,000 Units Oral Q7 days   Continuous Infusions:     LOS: 20 days   Burnadette Pop, MD [facility] P8/13/2024, 1:27 PM

## 2023-05-07 NOTE — TOC Progression Note (Signed)
Transition of Care [facility]) - Progression Note    Patient Details  Name: Scott Pruitt MRN: [id number] Date of Birth: Jun 30, 1961  Transition of Care [facility]) CM/SW Contact  Erin Sons, [place] Phone Number: 05/07/2023, 3:47 PM  Clinical Narrative:     [facility] still pending at this time. TOC will continue to follow.  Expected Discharge Plan: Skilled Nursing Facility Barriers to Discharge: Insurance Authorization  Expected Discharge Plan and Services         Expected Discharge Date: 05/01/23                                     Social Determinants of Health (SDOH) Interventions SDOH Screenings   Food Insecurity: Food Insecurity Present (04/18/2023)  Housing: Low Risk  (04/18/2023)  Transportation Needs: No Transportation Needs (04/18/2023)  Utilities: Not At Risk (04/28/2023)  Depression (PHQ2-9): Medium Risk (03/11/2020)  Social Connections: Unknown (02/02/2022)   Received from [facility]  Tobacco Use: Medium Risk (04/18/2023)    Readmission Risk Interventions     No data to display

## 2023-05-08 DIAGNOSIS — E559 Vitamin D deficiency, unspecified: Secondary | ICD-10-CM

## 2023-05-08 DIAGNOSIS — I639 Cerebral infarction, unspecified: Secondary | ICD-10-CM

## 2023-05-08 DIAGNOSIS — L8990 Pressure ulcer of unspecified site, unspecified stage: Secondary | ICD-10-CM | POA: Insufficient documentation

## 2023-05-08 DIAGNOSIS — I10 Essential (primary) hypertension: Secondary | ICD-10-CM | POA: Diagnosis not present

## 2023-05-08 DIAGNOSIS — E876 Hypokalemia: Secondary | ICD-10-CM

## 2023-05-08 DIAGNOSIS — R7401 Elevation of levels of liver transaminase levels: Secondary | ICD-10-CM | POA: Diagnosis not present

## 2023-05-08 DIAGNOSIS — S72001P Fracture of unspecified part of neck of right femur, subsequent encounter for closed fracture with malunion: Secondary | ICD-10-CM | POA: Diagnosis not present

## 2023-05-08 DIAGNOSIS — E871 Hypo-osmolality and hyponatremia: Secondary | ICD-10-CM | POA: Diagnosis not present

## 2023-05-08 DIAGNOSIS — R4189 Other symptoms and signs involving cognitive functions and awareness: Secondary | ICD-10-CM

## 2023-05-08 LAB — GLUCOSE, CAPILLARY: Glucose-Capillary: 118 mg/dL — ABNORMAL HIGH (ref 70–99)

## 2023-05-08 NOTE — Progress Notes (Signed)
Physical Therapy Treatment Patient Details Name: Scott Pruitt MRN: [id number] DOB: 23-Feb-1961 Today's Date: 05/08/2023   History of Present Illness 62 y.o. male admitted to [facility] on 04/17/2023 with acute right hip fracture after ground-level mechanical fall after presenting from home to [facility] ED complaining of right hip pain. He is s/p nailing of R hip fx 04/18/23. 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    PT Comments  Pt tolerates treatment well but continues to require physical assistance for all mobility. PT emphasizing cues to increase trunk flexion during transfers. Pt requires max encouragement to initiate gait training this session. PT provides significant education on the need for mobilization in an effort to improve function and comfort. PT continues to recommend short term inpatient PT services.    If plan is discharge home, recommend the following: Two people to help with walking and/or transfers;A lot of help with bathing/dressing/bathroom;Assistance with cooking/housework;Direct supervision/assist for medications management;Assist for transportation;Help with stairs or ramp for entrance   Can travel by private vehicle     No  Equipment Recommendations  Wheelchair (measurements PT);Wheelchair cushion (measurements PT);Hospital bed    Recommendations for Other Services       Precautions / Restrictions Precautions Precautions: Fall Precaution Comments: bowel/bladder incontinence Restrictions Weight Bearing Restrictions: Yes RLE Weight Bearing: Weight bearing as tolerated     Mobility  Bed Mobility Overal bed mobility: Needs Assistance Bed Mobility: Rolling, Sidelying to Sit, Sit to Supine Rolling: Min assist Sidelying to sit: Mod assist   Sit to supine: Mod assist        Transfers Overall transfer level: Needs  assistance Equipment used: Rolling walker (2 wheels) Transfers: Sit to/from Stand Sit to Stand: Min assist, From elevated surface           General transfer comment: verbal cues for hand placement and verbal and tactile cues to facilitate increased trunk flexion    Ambulation/Gait Ambulation/Gait assistance: Mod assist Gait Distance (Feet): 2 Feet Assistive device: Rolling walker (2 wheels) Gait Pattern/deviations: Step-to pattern Gait velocity: reduced Gait velocity interpretation: <1.31 ft/sec, indicative of household ambulator   General Gait Details: PT assists in facilitating weight shift to initiate stepping   Stairs             Wheelchair Mobility     Tilt Bed    Modified Rankin (Stroke Patients Only)       Balance Overall balance assessment: Needs assistance Sitting-balance support: Single extremity supported, Feet supported Sitting balance-Leahy Scale: Poor Sitting balance - Comments: posterior lean initially   Standing balance support: Bilateral upper extremity supported Standing balance-Leahy Scale: Poor Standing balance comment: minA                            Cognition Arousal: Alert Behavior During Therapy: Flat affect Overall Cognitive Status: No family/caregiver present to determine baseline cognitive functioning                                 General Comments: pt is alert, oriented to person and place. Pt with slowed processing.        Exercises      General Comments General comments (skin integrity, edema, etc.): VSS on RA      Pertinent Vitals/Pain Pain Assessment Pain Assessment: Faces Faces Pain Scale:  Hurts little more Pain Location: RLE Pain Descriptors / Indicators: Sore Pain Intervention(s): Monitored during session    Home Living                          Prior Function            PT Goals (current goals can now be found in the care plan section) Acute Rehab PT  Goals Patient Stated Goal: none stated PT Goal Formulation: With patient Time For Goal Achievement: 05/22/23 Potential to Achieve Goals: Fair Progress towards PT goals: Progressing toward goals    Frequency    Min 1X/week      PT Plan Current plan remains appropriate    Co-evaluation              AM-PAC PT "6 Clicks" Mobility   Outcome Measure  Help needed turning from your back to your side while in a flat bed without using bedrails?: A Little Help needed moving from lying on your back to sitting on the side of a flat bed without using bedrails?: A Lot Help needed moving to and from a bed to a chair (including a wheelchair)?: A Lot Help needed standing up from a chair using your arms (e.g., wheelchair or bedside chair)?: A Lot Help needed to walk in hospital room?: Total Help needed climbing 3-5 steps with a railing? : Total 6 Click Score: 11    End of Session Equipment Utilized During Treatment: Gait belt Activity Tolerance: Patient tolerated treatment well Patient left: in bed;with call bell/phone within reach;with bed alarm set Nurse Communication: Need for lift equipment;Mobility status PT Visit Diagnosis: Other abnormalities of gait and mobility (R26.89);Muscle weakness (generalized) (M62.81);Pain Pain - Right/Left: Right Pain - part of body: Hip;Leg     Time: 2952-8413 PT Time Calculation (min) (ACUTE ONLY): 23 min  Charges:    $Therapeutic Activity: 23-37 mins PT General Charges $$ ACUTE PT VISIT: 1 Visit                     Arlyss Gandy, PT, DPT Acute Rehabilitation Office [phone]    Arlyss Gandy 05/08/2023, 12:40 PM

## 2023-05-08 NOTE — Progress Notes (Signed)
Occupational Therapy Treatment Patient Details Name: Scott Pruitt MRN: [id number] DOB: Jun 30, 1961 Today's Date: 05/08/2023   History of present illness 62 y.o. male admitted to [facility] on 04/17/2023 with acute right hip fracture after ground-level mechanical fall after presenting from home to [facility] ED complaining of right hip pain. He is s/p nailing of R hip fx 04/18/23. 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   OT comments  Pt progressing toward established OT goals. Performing bed mobility with min-mod A this session and continues to need assist for management of RLE. Max cues during all stand trials for safety with hand placement and requiring mod A for STS; max A for side steps toward R (HOB), and pt constantly asking to sit down reporting he is in pain. Performing oral care seated with up to min A for balance. Will continue to follow. Patient will benefit from continued inpatient follow up therapy, <3 hours/day.       If plan is discharge home, recommend the following:  A lot of help with walking and/or transfers;A lot of help with bathing/dressing/bathroom;Assistance with cooking/housework;Assist for transportation;Help with stairs or ramp for entrance;Direct supervision/assist for financial management;Direct supervision/assist for medications management   Equipment Recommendations  Other (comment) (defer)    Recommendations for Other Services      Precautions / Restrictions Precautions Precautions: Fall Precaution Comments: bowel/bladder incontinence Restrictions Weight Bearing Restrictions: Yes RLE Weight Bearing: Weight bearing as tolerated       Mobility Bed Mobility Overal bed mobility: Needs Assistance Bed Mobility: Rolling, Sidelying to Sit, Sit to Supine Rolling: Min assist Sidelying to sit: Mod assist   Sit to supine: Mod  assist        Transfers Overall transfer level: Needs assistance Equipment used: Rolling walker (2 wheels) Transfers: Sit to/from Stand Sit to Stand: From elevated surface, Mod assist           General transfer comment: multimoda cues for safety and sequencing during STS and steps toward [facility]     Balance Overall balance assessment: Needs assistance Sitting-balance support: Single extremity supported, Feet supported Sitting balance-Leahy Scale: Poor Sitting balance - Comments: L lateral and posterior lean initially Postural control: Posterior lean Standing balance support: Bilateral upper extremity supported Standing balance-Leahy Scale: Poor Standing balance comment: minA                           ADL either performed or assessed with clinical judgement   ADL Overall ADL's : Needs assistance/impaired     Grooming: Oral care;Contact guard assist;Minimal assistance;Sitting Grooming Details (indicate cue type and reason): L lateral lean noted. Once provided set-up no cueing needed for squencing                 Toilet Transfer: Maximal assistance;Stand-pivot;BSC/3in1 Toilet Transfer Details (indicate cue type and reason): cues and OT facilitation of RLE movement                Extremity/Trunk Assessment Upper Extremity Assessment Upper Extremity Assessment: Generalized weakness   Lower Extremity Assessment Lower Extremity Assessment: Defer to PT evaluation        Vision   Additional Comments: navigating TV remote with min cues   Perception     Praxis      Cognition Arousal: Alert Behavior During Therapy: Flat affect Overall Cognitive Status: No family/caregiver present to determine baseline  cognitive functioning                                 General Comments: pt is alert, oriented to person and place. Pt with slowed processing. Reporting he has a 31 year old daughter 2x during session.        Exercises Exercises:  Other exercises Other Exercises Other Exercises: seated BUE AROM at shoulders and elbows.    Shoulder Instructions       General Comments VSS on RA    Pertinent Vitals/ Pain       Pain Assessment Pain Assessment: Faces Faces Pain Scale: Hurts little more Pain Location: RLE Pain Descriptors / Indicators: Sore Pain Intervention(s): Limited activity within patient's tolerance, Monitored during session  Home Living                                          Prior Functioning/Environment              Frequency  Min 1X/week        Progress Toward Goals  OT Goals(current goals can now be found in the care plan section)  Progress towards OT goals: Progressing toward goals  Acute Rehab OT Goals Patient Stated Goal: get better OT Goal Formulation: With patient Time For Goal Achievement: 05/10/23 Potential to Achieve Goals: Good ADL Goals Pt Will Perform Lower Body Bathing: with min assist;sit to/from stand;sitting/lateral leans Pt Will Perform Lower Body Dressing: with min assist;sitting/lateral leans Pt Will Transfer to Toilet: with min assist;stand pivot transfer  Plan Discharge plan remains appropriate;Frequency remains appropriate    Co-evaluation                 AM-PAC OT "6 Clicks" Daily Activity     Outcome Measure   Help from another person eating meals?: A Little Help from another person taking care of personal grooming?: A Little Help from another person toileting, which includes using toliet, bedpan, or urinal?: A Lot Help from another person bathing (including washing, rinsing, drying)?: A Lot Help from another person to put on and taking off regular upper body clothing?: A Little Help from another person to put on and taking off regular lower body clothing?: A Lot 6 Click Score: 15    End of Session Equipment Utilized During Treatment: Gait belt;Rolling walker (2 wheels)  OT Visit Diagnosis: Unsteadiness on feet  (R26.81);Other abnormalities of gait and mobility (R26.89);Muscle weakness (generalized) (M62.81);Pain Pain - Right/Left: Right Pain - part of body: Hip;Leg   Activity Tolerance Patient limited by pain   Patient Left in bed;with call bell/phone within reach;with bed alarm set   Nurse Communication Mobility status        Time: 1411-1445 OT Time Calculation (min): 34 min  Charges: OT General Charges $OT Visit: 1 Visit OT Treatments $Self Care/Home Management : 8-22 mins $Therapeutic Activity: 8-22 mins  Myrla Halsted, OTD, OTR/L [facility] Acute Rehabilitation Office: [phone]   Myrla Halsted 05/08/2023, 5:06 PM

## 2023-05-08 NOTE — Progress Notes (Signed)
PROGRESS NOTE    Scott Pruitt  ZOX:[id number] DOB: [birth year] DOA: 04/17/2023 PCP: Pcp, No   Chief Complaint  Patient presents with   Fall    Brief Narrative: Patient is a 62 year old with history of HTN, HLD who presented to the hospital after a fall sustaining a right hip fracture.  Patient underwent IM nail of the right hip by Dr. Susa Simmonds on 7/25 thereafter PT OT recommended SNF.  While awaiting placement patient did have some cognitive decline prompting to obtain MRI of the brain which ended up showing acute CVA.  Neurology team was consulted.  Upon further workup patient was noted to have right lower extremity extensive DVT therefore started on anticoagulation.  Also had transcranial Dopplers with bubble study which was negative.  Patient was eventually transitioned to oral Eliquis.  Currently awaiting SNF placement.  Medically stable for discharge whenever possible.    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 -MRI brain obtained concerning for acute CVA. -CT angiogram head and neck done with some vertebral stenosis otherwise no LVO. -Hemoglobin A1c now at 5.4.  LDL of 61. -2D echo done with normal EF of 55 to 60%, no interatrial shunt noted. -Transcranial Dopplers negative. -Lower extremity Dopplers positive for acute right DVT. -Patient noted to have been started on high-dose thiamine and currently on we will continue oral thiamine on discharge. -Currently on Eliquis for secondary stroke prophylaxis. -Patient seen by PT/OT recommending SNF placement. -Will need outpatient follow-up  with neurology.  2.  Right lower extremity DVT -Noted on lower extremity Dopplers. -Eliquis.  3.  Close right hip fracture status post IM nail 7/25 -Patient seen in consultation by orthopedics and patient underwent ORIF by orthopedics: Dr. Susa Simmonds 04/18/2023. -Initial recommendations was for full dose aspirin twice daily for DVT prophylaxis however patient now on Eliquis due to acute CVA and as such full dose aspirin discontinued. -Outpatient follow-up with orthopedics.  4.  Hypokalemia/hyponatremia -Stable.  5.  Normocytic anemia -H&H stable. -Status post IV iron. -Continue oral iron supplementation on discharge.  6.  Vitamin D deficiency -Continue vitamin D supplementation.  7.  Transaminitis -Acute hepatitis panel negative. -Patient noted to have denied alcohol use. -Resolved.  8.  Hyperlipidemia -LDL noted of 61. -Not on a statin at present.  9.  Hypertension -BP stable.  10.  Vitamin B12 deficiency -Continue B12 supplementation.  11.  Constipation -Continue current bowel regimen  12.  3 mm pulmonary nodule -Outpatient follow-up.  13.  Pressure injury Pressure Injury 04/22/23 Sacrum Mid Stage 2 -  Partial thickness loss of dermis presenting as a shallow open injury with a red, pink wound bed without slough. blister (Active)  04/22/23 0230  Location: Sacrum  Location Orientation: Mid  Staging: Stage 2 -  Partial thickness loss of dermis presenting as a shallow open injury with a red, pink wound bed without slough.  Wound Description (Comments): blister  Present on Admission: No      DVT prophylaxis: Eliquis Code Status: Full Family Communication: Updated patient.  No family at bedside. Disposition: Patient medically stable.  SNF when bed available.  Status is: Inpatient Remains inpatient appropriate because: Unsafe disposition.   Consultants:  Orthopedics: Dr. Susa Simmonds 04/18/2023 Wound care RN: Ria Bush 04/21/2023 Neurology: Dr.Khaliqdina  04/28/2023  Procedures:  CT right hip 04/18/2023 CT angiogram head and neck 04/27/2023 Chest x-ray 04/17/2023, 04/28/2023 Abdominal films 04/27/2023 Plain films of the skull 04/27/2023 Plain films of the right femur 04/18/2023 MRI brain 04/27/2023 2D echo 04/28/2023 Bilateral lower extremity Dopplers 04/28/2023 Transcranial Dopplers 04/28/2023 Plain films of the right hip and pelvis 04/17/2023 Cephalomedullary nail of the right hip 04/18/2023 per orthopedics: Dr. Susa Simmonds  Antimicrobials:  Anti-infectives (From admission, onward)    Start     Dose/Rate Route Frequency Ordered Stop   04/18/23 1800  ceFAZolin (ANCEF) IVPB 1 g/50 mL premix        1 g 100 mL/hr over 30 Minutes Intravenous Every 6 hours 04/18/23 1512 04/19/23 0715         Subjective: Patient laying in bed.  Sleeping.  Denies any chest pain.  No shortness of breath.  No abdominal pain.  No significant complaints of right hip pain.  Objective: Vitals:   05/07/23 2009 05/08/23 0404 05/08/23 0500 05/08/23 0726  BP: (!) 143/80 (!) 141/84  (!) 142/92  Pulse: 76 68  74  Resp: 18 18  18   Temp: 97.9 F (36.6 C) 98 F (36.7 C)  98.1 F (36.7 C)  TempSrc: Oral Oral    SpO2: 98% 100%  99%  Weight:   71.8 kg   Height:        Intake/Output Summary (Last 24 hours) at 05/08/2023 1023 Last data filed at 05/08/2023 0626 Gross per 24 hour  Intake --  Output 1700 ml  Net -1700 ml   Filed Weights   05/04/23 0500 05/05/23 0500 05/08/23 0500  Weight: 70.5 kg 70.5 kg 71.8 kg    Examination:  General exam: Appears calm and comfortable  Respiratory system: Clear to auscultation.  No wheezes, no crackles, no rhonchi.  Fair air movement.  Speaking in full sentences.  Respiratory effort normal. Cardiovascular system: S1 & S2 heard, RRR. No JVD, murmurs, rubs, gallops or clicks. No pedal edema. Gastrointestinal system: Abdomen is nondistended, soft and nontender. No organomegaly or masses felt. Normal bowel sounds heard. Central nervous system:  Alert and oriented. No focal neurological deficits. Extremities: Right hip with postop bandage in place.  Nontender to palpation.  Skin: No rashes, lesions or ulcers Psychiatry: Judgement and insight appear fair. Mood & affect appropriate.     Data Reviewed: I have personally reviewed following labs and imaging studies  CBC: Recent Labs  Lab 05/02/23 0057 05/03/23 0013  WBC 8.4 8.6  HGB 9.8* 9.7*  HCT 30.3* 30.5*  MCV 92.9 92.1  PLT 411* 477*    Basic Metabolic Panel: Recent Labs  Lab 05/02/23 0057 05/03/23 0013  NA 136 133*  K 4.1 4.1  CL 99 98  CO2 23 27  GLUCOSE 85 105*  BUN 17 19  CREATININE 0.86 1.03  CALCIUM 8.6* 8.4*  MG 1.9 2.0    GFR: Estimated Creatinine Clearance: 73.4 mL/min (by C-G formula based on SCr of 1.03 mg/dL).  Liver Function Tests: No results for input(s): "AST", "ALT", "ALKPHOS", "BILITOT", "PROT", "ALBUMIN" in the last 168 hours.  CBG: No results for input(s): "GLUCAP" in the last 168 hours.   No results found for this or any previous visit (from the past 240 hour(s)).       Radiology Studies: No results found.      Scheduled Meds:  apixaban  5 mg Oral BID   bisacodyl  10 mg Rectal Once   calcium carbonate  1 tablet Oral Q breakfast   cholecalciferol  1,000 Units Oral Daily   vitamin B-12  1,000 mcg Oral Daily   docusate sodium  100 mg Oral BID   Ferrous Fumarate  1 tablet Oral Daily   multivitamin with minerals  1 tablet Oral Daily   polyethylene glycol  17 g Oral BID   senna  1 tablet Oral BID   thiamine  100 mg Oral Daily   Vitamin D (Ergocalciferol)  50,000 Units Oral Q7 days   Continuous Infusions:   LOS: 21 days    Time spent: 35 minutes    Ramiro Harvest, MD [facility]   To contact the attending provider between 7A-7P or the covering provider during after hours 7P-7A, please log into the web site [email or web address] and access using universal [place] password for that web site. If you do not  have the password, please call the hospital operator.  05/08/2023, 10:23 AM

## 2023-05-08 NOTE — TOC Progression Note (Signed)
Transition of Care [facility]) - Progression Note    Patient Details  Name: Scott Pruitt MRN: [id number] Date of Birth: May 15, 1961  Transition of Care [facility]) CM/SW Contact  Erin Sons, [place] Phone Number: 05/08/2023, 2:05 PM  Clinical Narrative:      [facility] still pending at this time. TOC will continue to follow.   Expected Discharge Plan: Skilled Nursing Facility Barriers to Discharge: Insurance Authorization  Expected Discharge Plan and Services         Expected Discharge Date: 05/01/23                                     Social Determinants of Health (SDOH) Interventions SDOH Screenings   Food Insecurity: Food Insecurity Present (04/18/2023)  Housing: Low Risk  (04/18/2023)  Transportation Needs: No Transportation Needs (04/18/2023)  Utilities: Not At Risk (04/28/2023)  Depression (PHQ2-9): Medium Risk (03/11/2020)  Social Connections: Unknown (02/02/2022)   Received from [facility], [facility]  Tobacco Use: Medium Risk (04/18/2023)    Readmission Risk Interventions     No data to display

## 2023-05-08 NOTE — Plan of Care (Signed)
  Problem: Clinical Measurements: Goal: Ability to maintain clinical measurements within normal limits will improve Outcome: Progressing Goal: Will remain free from infection Outcome: Progressing   Problem: Ischemic Stroke/TIA Tissue Perfusion: Goal: Complications of ischemic stroke/TIA will be minimized Outcome: Progressing   Problem: Coping: Goal: Will verbalize positive feelings about self Outcome: Progressing   Problem: Health Behavior/Discharge Planning: Goal: Ability to manage health-related needs will improve Outcome: Progressing   Problem: Nutrition: Goal: Risk of aspiration will decrease Outcome: Progressing

## 2023-05-08 NOTE — Plan of Care (Signed)
  Problem: Clinical Measurements: Goal: Ability to maintain clinical measurements within normal limits will improve Outcome: Progressing Goal: Will remain free from infection Outcome: Progressing   Problem: Health Behavior/Discharge Planning: Goal: Ability to manage health-related needs will improve Outcome: Progressing   Problem: Nutrition: Goal: Risk of aspiration will decrease Outcome: Progressing

## 2023-05-09 DIAGNOSIS — I10 Essential (primary) hypertension: Secondary | ICD-10-CM | POA: Diagnosis not present

## 2023-05-09 DIAGNOSIS — S72001P Fracture of unspecified part of neck of right femur, subsequent encounter for closed fracture with malunion: Secondary | ICD-10-CM | POA: Diagnosis not present

## 2023-05-09 DIAGNOSIS — E871 Hypo-osmolality and hyponatremia: Secondary | ICD-10-CM | POA: Diagnosis not present

## 2023-05-09 DIAGNOSIS — R7401 Elevation of levels of liver transaminase levels: Secondary | ICD-10-CM | POA: Diagnosis not present

## 2023-05-09 NOTE — Progress Notes (Signed)
PROGRESS NOTE    Scott Pruitt  NWG:[id number] DOB: 08-23-1961 DOA: 04/17/2023 PCP: Pcp, No   Chief Complaint  Patient presents with   Fall    Brief Narrative: Patient is a 62 year old with history of HTN, HLD who presented to the hospital after a fall sustaining a right hip fracture.  Patient underwent IM nail of the right hip by Dr. Susa Simmonds on 7/25 thereafter PT OT recommended SNF.  While awaiting placement patient did have some cognitive decline prompting to obtain MRI of the brain which ended up showing acute CVA.  Neurology team was consulted.  Upon further workup patient was noted to have right lower extremity extensive DVT therefore started on anticoagulation.  Also had transcranial Dopplers with bubble study which was negative.  Patient was eventually transitioned to oral Eliquis.  Currently awaiting SNF placement.  Medically stable for discharge whenever possible.    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 -MRI brain obtained concerning for acute CVA. -CT angiogram head and neck done with some vertebral stenosis otherwise no LVO. -Hemoglobin A1c now at 5.4.  LDL of 61. -2D echo done with normal EF of 55 to 60%, no interatrial shunt noted. -Transcranial Dopplers negative. -Lower extremity Dopplers positive for acute right DVT. -Patient noted to have been started on high-dose thiamine and currently on we will continue oral thiamine on discharge. -Currently on Eliquis for secondary stroke  prophylaxis. -Patient seen by PT/OT recommending SNF placement. -Will need outpatient follow-up with neurology.  2.  Right lower extremity DVT -Noted on lower extremity Dopplers. -Eliquis.  3.  Close right hip fracture status post IM nail 7/25 -Patient seen in consultation by orthopedics and patient underwent ORIF by orthopedics: Dr. Susa Simmonds 04/18/2023. -Initial recommendations was for full dose aspirin twice daily for DVT prophylaxis however patient now on Eliquis due to acute CVA and as such full dose aspirin discontinued. -Outpatient follow-up with orthopedics.  4.  Hypokalemia/hyponatremia -Potassium repleted.    5.  Normocytic anemia -H&H stable. -Status post IV iron. -Continue oral iron supplementation on discharge.  6.  Vitamin D deficiency -Vitamin D supplementation.   7.  Transaminitis -Acute hepatitis panel negative. -Patient noted to have denied alcohol use. -Resolved.  8.  Hyperlipidemia -LDL noted of 61. -Not on a statin at present.  9.  Hypertension -BP stable.  10.  Vitamin B12 deficiency -Vitamin B12 supplementation.   11.  Constipation -Continue current bowel regimen.   12.  3 mm pulmonary nodule -Outpatient follow-up.  13.  Pressure injury Pressure Injury 04/22/23 Sacrum Mid Stage 2 -  Partial thickness loss of dermis presenting as a shallow open injury with a red, pink wound bed without slough. blister (Active)  04/22/23 0230  Location: Sacrum  Location Orientation: Mid  Staging: Stage 2 -  Partial thickness loss of dermis presenting as a shallow open injury with a red, pink wound bed without slough.  Wound Description (Comments): blister  Present on Admission: No      DVT prophylaxis: Eliquis  Code Status: Full Family Communication: Updated patient.  No family at bedside. Disposition: Patient medically stable.  SNF when bed available.  Status is: Inpatient Remains inpatient appropriate because: Unsafe disposition.   Consultants:   Orthopedics: Dr. Susa Simmonds 04/18/2023 Wound care RN: Ria Bush 04/21/2023 Neurology: Dr.Khaliqdina 04/28/2023  Procedures:  CT right hip 04/18/2023 CT angiogram head and neck 04/27/2023 Chest x-ray 04/17/2023, 04/28/2023 Abdominal films 04/27/2023 Plain films of the skull 04/27/2023 Plain films of the right femur 04/18/2023 MRI brain 04/27/2023 2D echo 04/28/2023 Bilateral lower extremity Dopplers 04/28/2023 Transcranial Dopplers 04/28/2023 Plain films of the right hip and pelvis 04/17/2023 Cephalomedullary nail of the right hip 04/18/2023 per orthopedics: Dr. Susa Simmonds  Antimicrobials:  Anti-infectives (From admission, onward)    Start     Dose/Rate Route Frequency Ordered Stop   04/18/23 1800  ceFAZolin (ANCEF) IVPB 1 g/50 mL premix        1 g 100 mL/hr over 30 Minutes Intravenous Every 6 hours 04/18/23 1512 04/19/23 0715         Subjective: Sitting up in bed, just finished eating his lunch.  Denies any chest pain or shortness of breath.  Some complaints of right hip pain with movement per patient.  Asking which SNF facility will he be going to.   Objective: Vitals:   05/08/23 1620 05/08/23 2017 05/09/23 0503 05/09/23 0744  BP: 127/89 126/82 132/85 139/83  Pulse: 74 74 84 82  Resp: 18 18 18 18   Temp: 98.5 F (36.9 C) 98.5 F (36.9 C) 98.8 F (37.1 C) 99.3 F (37.4 C)  TempSrc: Oral Oral Oral Oral  SpO2: 98% 97% 98% 100%  Weight:      Height:        Intake/Output Summary (Last 24 hours) at 05/09/2023 1253 Last data filed at 05/09/2023 0500 Gross per 24 hour  Intake --  Output 700 ml  Net -700 ml   Filed Weights   05/04/23 0500 05/05/23 0500 05/08/23 0500  Weight: 70.5 kg 70.5 kg 71.8 kg    Examination:  General exam: NAD. Respiratory system: Lungs clear to auscultation bilaterally.  No wheezes, no crackles, no rhonchi.  Fair air movement.  Speaking in full sentences.  Normal respiratory effort.  Cardiovascular system: Regular rate rhythm no murmurs rubs or gallops.  No JVD.  No  lower extremity edema.  Gastrointestinal system: Abdomen is soft, nontender, nondistended, positive bowel sounds.  No rebound.  No guarding.  Central nervous system: Alert and oriented. No focal neurological deficits. Extremities: Right hip with postop bandage in place.  Nontender to palpation.  Skin: No rashes, lesions or ulcers Psychiatry: Judgement and insight appear fair. Mood & affect appropriate.     Data Reviewed: I have personally reviewed following labs and imaging studies  CBC: Recent Labs  Lab 05/03/23 0013  WBC 8.6  HGB 9.7*  HCT 30.5*  MCV 92.1  PLT 477*    Basic Metabolic Panel: Recent Labs  Lab 05/03/23 0013  NA 133*  K 4.1  CL 98  CO2 27  GLUCOSE 105*  BUN 19  CREATININE 1.03  CALCIUM 8.4*  MG 2.0    GFR: Estimated Creatinine Clearance: 73.4 mL/min (by C-G formula based on SCr of 1.03 mg/dL).  Liver Function Tests: No results for input(s): "AST", "ALT", "ALKPHOS", "BILITOT", "PROT", "ALBUMIN" in the last 168 hours.  CBG: Recent Labs  Lab 05/08/23 2023  GLUCAP 118*     No results found for this or any previous visit (from the past 240 hour(s)).  Radiology Studies: No results found.      Scheduled Meds:  apixaban  5 mg Oral BID   bisacodyl  10 mg Rectal Once   calcium carbonate  1 tablet Oral Q breakfast   cholecalciferol  1,000 Units Oral Daily   vitamin B-12  1,000 mcg Oral Daily   docusate sodium  100 mg Oral BID   Ferrous Fumarate  1 tablet Oral Daily   multivitamin with minerals  1 tablet Oral Daily   polyethylene glycol  17 g Oral BID   senna  1 tablet Oral BID   thiamine  100 mg Oral Daily   Vitamin D (Ergocalciferol)  50,000 Units Oral Q7 days   Continuous Infusions:   LOS: 22 days    Time spent: 35 minutes    Ramiro Harvest, MD [facility]   To contact the attending provider between 7A-7P or the covering provider during after hours 7P-7A, please log into the web site [email or web address] and access  using universal [place] password for that web site. If you do not have the password, please call the hospital operator.  05/09/2023, 12:53 PM

## 2023-05-09 NOTE — Progress Notes (Signed)
Physical Therapy Treatment Patient Details Name: Scott Pruitt MRN: [id number] DOB: [birth year] Today's Date: 05/09/2023   History of Present Illness 62 y.o. male admitted to [facility] on 04/17/2023 with acute right hip fracture after ground-level mechanical fall after presenting from home to [facility] ED complaining of right hip pain. He is s/p nailing of R hip fx 04/18/23. 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    PT Comments  Pt received in supine, calling out for help, unable to find call bell although it was on his lap. Pt needs multimodal cues for instruction on all tasks including bed mobility and transfers and was able to demo back call bell use after dense instruction. Pt requesting pain meds, RN notified and had decreased standing tolerance today due to c/o R hip pain. Pt with bowel incontinence noted upon therapist arrival and needed totalA for hygiene assist while pt standing EOB. Pt stood 4 reps from EOB and needed maxA for stand pivot from bed>chair with pt unable to take side steps today due to pain/poor sequencing and sitting well before reaching proximity to chair, nearly totalA for stand>sit. Recommend Stedy/+2 dependent squat pivot for chair>bed return, discussed with staff. Pt continues to benefit from PT services to progress toward functional mobility goals.     If plan is discharge home, recommend the following: Two people to help with walking and/or transfers;A lot of help with bathing/dressing/bathroom;Assistance with cooking/housework;Direct supervision/assist for medications management;Assist for transportation;Help with stairs or ramp for entrance   Can travel by private vehicle     No  Equipment Recommendations  Wheelchair (measurements PT);Wheelchair cushion (measurements PT);Hospital bed    Recommendations for Other Services        Precautions / Restrictions Precautions Precautions: Fall Precaution Comments: bowel/bladder incontinence Restrictions Weight Bearing Restrictions: Yes RLE Weight Bearing: Weight bearing as tolerated     Mobility  Bed Mobility Overal bed mobility: Needs Assistance Bed Mobility: Rolling, Sidelying to Sit Rolling: Min assist Sidelying to sit: Mod assist       General bed mobility comments: Pt needing assist with RLE movement, he guards it. Min A to help scoot anteriorly    Transfers Overall transfer level: Needs assistance Equipment used: Rolling walker (2 wheels) Transfers: Sit to/from Stand Sit to Stand: From elevated surface, Max assist, +2 safety/equipment Stand pivot transfers: Max assist, From elevated surface         General transfer comment: multimoda cues for safety and sequencing during STS and attempted steps, pt guarding and unable to follow instructions for weight shifting/safe BLE placement and despite chair pulled very close to EOB and assist for weight shifting, pt unable to take steps even with maxA, pt sits prematurely with stand pivot needing nearly totalA for stand>sit safety.    Ambulation/Gait                   Stairs             Wheelchair Mobility     Tilt Bed    Modified Rankin (Stroke Patients Only)       Balance Overall balance assessment: Needs assistance Sitting-balance support: Feet supported, Bilateral upper extremity supported Sitting balance-Leahy Scale: Fair Sitting balance - Comments: slight L lean; fair with BUE support, poor when unsupported Postural control: Posterior lean Standing balance support: Bilateral upper extremity supported Standing balance-Leahy Scale: Poor Standing balance comment: mod progressing to maxA for  static standing, very crouched posture in RW                            Cognition Arousal: Alert Behavior During Therapy: Flat affect Overall Cognitive Status: No  family/caregiver present to determine baseline cognitive functioning                                 General Comments: pt is alert, oriented to person and place, but screaming out into hallway "I need help!" every few mins prior to and after session. During session while PTA and NT assisting him with peri-care at bedside, pt also calls out "help!" although he is actively being assisted. Pt with slowed processing and poor insight into deficit/need to mobilize, mostly perseverating on R hip pain. Reporting he has a 64 year old daughter and a 62 year old son, photo of his daughter in the room. Pt seems to be more confused with more difficulty following instructions than in previous PT session.        Exercises Other Exercises Other Exercises: reviewed supine BLE AROM: pt with poor carryover and understanding today    General Comments General comments (skin integrity, edema, etc.): pt soiled upon PTA arrival to room, calling "help" then unable to state what he needs help with. PTA encouraged him to roll to side so linens can be checked and pt observed to have had BM, needed max encouragement to participate in transfer OOB so that his bed linens can be changed.      Pertinent Vitals/Pain Pain Assessment Pain Assessment: Faces Faces Pain Scale: Hurts whole lot Pain Location: RLE Pain Descriptors / Indicators: Sore, Grimacing, Moaning Pain Intervention(s): Limited activity within patient's tolerance, Monitored during session, Repositioned, Patient requesting pain meds-RN notified, Ice applied    Home Living                          Prior Function            PT Goals (current goals can now be found in the care plan section) Acute Rehab PT Goals Patient Stated Goal: none stated other than less pain PT Goal Formulation: With patient Time For Goal Achievement: 05/22/23 Progress towards PT goals: Progressing toward goals    Frequency    Min 1X/week      PT  Plan Current plan remains appropriate    Co-evaluation              AM-PAC PT "6 Clicks" Mobility   Outcome Measure  Help needed turning from your back to your side while in a flat bed without using bedrails?: A Little Help needed moving from lying on your back to sitting on the side of a flat bed without using bedrails?: A Lot Help needed moving to and from a bed to a chair (including a wheelchair)?: Total Help needed standing up from a chair using your arms (e.g., wheelchair or bedside chair)?: A Lot Help needed to walk in hospital room?: Total Help needed climbing 3-5 steps with a railing? : Total 6 Click Score: 10    End of Session Equipment Utilized During Treatment: Gait belt Activity Tolerance: Patient limited by pain Patient left: in chair;with call bell/phone within reach;with chair alarm set;with nursing/sitter in room (NT in room to change his bed linens) Nurse Communication: Mobility status;Need for lift equipment (+2 dependent  pivot with draw pads vs Stedy given pt confusion/poor command follow getting up OOB) PT Visit Diagnosis: Other abnormalities of gait and mobility (R26.89);Muscle weakness (generalized) (M62.81);Pain Pain - Right/Left: Right Pain - part of body: Hip;Leg     Time: 1610-9604 PT Time Calculation (min) (ACUTE ONLY): 24 min  Charges:    $Therapeutic Activity: 23-37 mins PT General Charges $$ ACUTE PT VISIT: 1 Visit                      P., PTA Acute Rehabilitation Services Secure Chat Preferred 9a-5:30pm Office: [phone]    Dorathy Kinsman [facility] 05/09/2023, 3:08 PM

## 2023-05-10 DIAGNOSIS — R7401 Elevation of levels of liver transaminase levels: Secondary | ICD-10-CM | POA: Diagnosis not present

## 2023-05-10 DIAGNOSIS — I10 Essential (primary) hypertension: Secondary | ICD-10-CM | POA: Diagnosis not present

## 2023-05-10 DIAGNOSIS — E871 Hypo-osmolality and hyponatremia: Secondary | ICD-10-CM | POA: Diagnosis not present

## 2023-05-10 DIAGNOSIS — S72001P Fracture of unspecified part of neck of right femur, subsequent encounter for closed fracture with malunion: Secondary | ICD-10-CM | POA: Diagnosis not present

## 2023-05-10 NOTE — TOC Progression Note (Signed)
Transition of Care [facility]) - Progression Note    Patient Details  Name: Scott Pruitt MRN: [id number] Date of Birth: 10-06-1960  Transition [facility] CM/SW Contact  Arsh Feutz [place]is [place], [place] Phone Number: 05/10/2023, 1:05 PM  Clinical Narrative:      CSW spoke with Becky([phone]) at [facility] and is informed Berkley Harvey is approved. CSW called Retail banker and is informed that LOG they received had an error. CSW request TOC admin assistant to resend corrected LOG.   CSW callled [facility] back and transport is scheduled for 8pm tomorrow(8/17). CSW updated pt's s/o  [facility] will admit pt Saturday evening. 3rd Shift Nurse at the SNF will be Bradd Canary [phone].   Becky requests DC summary be faxed to 2 fax#'s:  [phone]  Transport papers on chart. Attending notified of anticipated DC via secure chat.    Expected Discharge Plan: Skilled Nursing Facility Barriers to Discharge: Insurance Authorization                                  Social Determinants of Health (SDOH) Interventions SDOH Screenings   Food Insecurity: Food Insecurity Present (04/18/2023)  Housing: Low Risk  (04/18/2023)  Transportation Needs: No Transportation Needs (04/18/2023)  Utilities: Not At Risk (04/28/2023)  Depression (PHQ2-9): Medium Risk (03/11/2020)  Social Connections: Unknown (02/02/2022)   Received from [facility]  Tobacco Use: Medium Risk (04/18/2023)    Readmission Risk Interventions     No data to display

## 2023-05-10 NOTE — Progress Notes (Signed)
Occupational Therapy Treatment Patient Details Name: Scott Pruitt MRN: [id number] DOB: 07-Apr-1961 Today's Date: 05/10/2023   History of present illness 62 y.o. male admitted to [facility] on 04/17/2023 with acute right hip fracture after ground-level mechanical fall after presenting from home to [facility] ED complaining of right hip pain. He is s/p nailing of R hip fx 04/18/23. 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   OT comments  Patient progressing slowly with his mobility as well as ADL's and edge of bed position due to continued right lower extremity pain, cognitive issues, and poor sitting balance with pronounced left lateral lean and inability to maintain midline sitting despite several corrections. Patient needs assistance to recognize that pain medicine prior to mobilizing will help as patient has only emergent awareness of pain once moving.  This was discussed with nurse, Fleet Contras to formulate a plan to try and optimize patient's success with therapy. Patient stated 1 time from elevated edge of bed to steady with max assist and poor standing tolerance.  Patient sat elevated on studies flaps but was quick to fatigue, and became tremulous, requesting return to supine and declining recliner.  Patient also required max assist for simply combing hair due to fatigue at edge of bed.  Patient's goals have been updated to reflect patient's current needs and plan of care extended and the care plan.  Pt continues to demonstrate fair rehab potential and would benefit from continued skilled OT to increase safety and independence with ADLs and functional transfers to allow pt to return home safely and reduce caregiver burden and fall risk.       If plan is discharge home, recommend the following:  A lot of help with walking and/or transfers;A lot of help with  bathing/dressing/bathroom;Assistance with cooking/housework;Assist for transportation;Help with stairs or ramp for entrance;Direct supervision/assist for financial management;Direct supervision/assist for medications management   Equipment Recommendations   (defer)    Recommendations for Other Services      Precautions / Restrictions Precautions Precautions: Fall Precaution Comments: bowel/bladder incontinence Restrictions Weight Bearing Restrictions: Yes RLE Weight Bearing: Weight bearing as tolerated       Mobility Bed Mobility Overal bed mobility: Needs Assistance Bed Mobility: Rolling, Sidelying to Sit Rolling: Min assist Sidelying to sit: Max assist, Used rails, HOB elevated   Sit to supine: Mod assist, Used rails, HOB elevated   General bed mobility comments: Pt needing assist with RLE movement: very stiff. Pt reported "I can't scoot. Help me" and required Max As with draw pad    Transfers                         Balance Overall balance assessment: Needs assistance Sitting-balance support: Feet supported, Bilateral upper extremity supported Sitting balance-Leahy Scale: Poor Sitting balance - Comments: Pronounced L lean. Unable to maintain midline with corrections and BUE support. Postural control: Left lateral lean   Standing balance-Leahy Scale: Poor                             ADL either performed or assessed with clinical judgement   ADL Overall ADL's : Needs assistance/impaired     Grooming: Brushing hair;Sitting;Maximal assistance Grooming Details (indicate cue type and reason): L lateral lean noted at EOB. Attmpted midline sitting several time with pt returning to LT forearm. Once [employer]  in place, pt with improved midline sit due to stedy bracing and pt able to use RT hand to comb front of hair but refused to do more due to fatigue.             Lower Body Dressing: Total assistance;Bed level Lower Body Dressing Details (indicate  cue type and reason): Socks Toilet Transfer: Maximal assistance;Cueing for sequencing;Cueing for safety Toilet Transfer Details (indicate cue type and reason): Pt stood from elevated EOB to [place] with Max As of 1 person, cues. Attempted first stand from lower position but pt unable. Higher bed position required. Pt unable to tuck hips well and flaps lowered as pt began to quickly sit. Pt tolerated sitting high on Stedy flaps for 3 min while working on balance with cues.  Pt became very tremulous adn requested return to supine. Refused recliner. Pt stood from elevated flaps with Mod As o1, cues and further assist to tuck hips to allow flaps to open. Pt lowered to EOB with tactile/verbal cues for hand reach back to bed rail and Mod As to control descent. Toileting- Clothing Manipulation and Hygiene: Total assistance;Bed level       Functional mobility during ADLs: Maximal assistance      Extremity/Trunk Assessment Upper Extremity Assessment Upper Extremity Assessment: Generalized weakness   Lower Extremity Assessment Lower Extremity Assessment: Generalized weakness RLE Deficits / Details: R hip fracture   Cervical / Trunk Assessment Cervical / Trunk Assessment: Other exceptions Cervical / Trunk Exceptions: Tremulous    Vision   Additional Comments: Bilateral eye estropia. Pt denied vision impairment. Unknown if this is post CVA or baseline.   Perception     Praxis      Cognition Arousal: Alert Behavior During Therapy: Flat affect Overall Cognitive Status: No family/caregiver present to determine baseline cognitive functioning Area of Impairment: Orientation, Awareness, Memory, Problem solving, Safety/judgement                 Orientation Level: Disoriented to, Time (Fairly oriented stating "September")   Memory: Decreased short-term memory   Safety/Judgement: Decreased awareness of safety Awareness: Emergent Problem Solving: Requires verbal cues, Requires tactile cues,  Slow processing          Exercises Other Exercises Other Exercises: Bed rail pulls ups for BUE strengthening. Pt tolerated 5 reps then asked to rest.    Shoulder Instructions       General Comments      Pertinent Vitals/ Pain       Pain Assessment Pain Assessment: Faces Faces Pain Scale: Hurts whole lot Pain Location: RLE- only with mobility. None at rest Pain Descriptors / Indicators: Sore, Grimacing, Moaning Pain Intervention(s): Limited activity within patient's tolerance, Monitored during session, Repositioned, Patient requesting pain meds-RN notified, Other (comment) (Rn to discuss pain meds and agreed to bring. At end of session pt denied pain.)  Home Living                                          Prior Functioning/Environment              Frequency  Min 1X/week        Progress Toward Goals  OT Goals(current goals can now be found in the care plan section)  Progress towards OT goals: Goals drowngraded-see care plan  Acute Rehab OT Goals Patient Stated Goal: Not hurt OT Goal Formulation: With patient Time For  Goal Achievement: 05/24/23 Potential to Achieve Goals: Fair ADL Goals Pt Will Perform Lower Body Bathing: with mod assist;sitting/lateral leans;bed level Pt Will Perform Lower Body Dressing: sitting/lateral leans;with min assist  Plan      Co-evaluation                 AM-PAC OT "6 Clicks" Daily Activity     Outcome Measure   Help from another person eating meals?: A Little Help from another person taking care of personal grooming?: A Lot Help from another person toileting, which includes using toliet, bedpan, or urinal?: Total Help from another person bathing (including washing, rinsing, drying)?: A Lot Help from another person to put on and taking off regular upper body clothing?: A Little Help from another person to put on and taking off regular lower body clothing?: Total 6 Click Score: 12    End of Session  Equipment Utilized During Treatment: Gait belt;Other (comment)  OT Visit Diagnosis: Unsteadiness on feet (R26.81);Other abnormalities of gait and mobility (R26.89);Muscle weakness (generalized) (M62.81);Pain;Other symptoms and signs involving cognitive function;History of falling (Z91.81) Pain - Right/Left: Right Pain - part of body: Hip;Leg   Activity Tolerance Patient limited by pain   Patient Left in bed;with call bell/phone within reach;with bed alarm set   Nurse Communication Mobility status;Patient requests pain meds        Time: 1258-1330 OT Time Calculation (min): 32 min  Charges: OT General Charges $OT Visit: 1 Visit OT Treatments $Self Care/Home Management : 8-22 mins $Therapeutic Activity: 8-22 mins  Victorino Dike, OT Acute Rehab Services Office: [phone] 05/10/2023  Theodoro Clock 05/10/2023, 1:48 PM

## 2023-05-10 NOTE — Progress Notes (Signed)
PROGRESS NOTE    Scott Pruitt  NWG:[id number] DOB: [birth year] DOA: 04/17/2023 PCP: Pcp, No   Chief Complaint  Patient presents with   Fall    Brief Narrative: Patient is a 62 year old with history of HTN, HLD who presented to the hospital after a fall sustaining a right hip fracture.  Patient underwent IM nail of the right hip by Dr. Susa Simmonds on 7/25 thereafter PT OT recommended SNF.  While awaiting placement patient did have some cognitive decline prompting to obtain MRI of the brain which ended up showing acute CVA.  Neurology team was consulted.  Upon further workup patient was noted to have right lower extremity extensive DVT therefore started on anticoagulation.  Also had transcranial Dopplers with bubble study which was negative.  Patient was eventually transitioned to oral Eliquis.  Currently awaiting SNF placement.  Medically stable for discharge whenever possible.    Assessment & Plan:   Principal Problem:   Closed right hip fracture [facility]) Active Problems:   Essential hypertension   Hyperlipidemia   Fall   Transaminitis   Hyponatremia   Vitamin D deficiency   Hypokalemia   Cognitive impairment   Acute CVA (cerebrovascular accident) (HCC)   Pressure injury of skin  #1 acute CVA of right temporal and left frontoparietal region/cognitive decline concerns for dementia -It is noted that overall patient had a mental decline over the past several months but due to persistent concerns of decline. -MRI brain obtained concerning for acute CVA. -CT angiogram head and neck done with some vertebral stenosis otherwise no LVO. -Hemoglobin A1c now at 5.4.  LDL of 61. -2D echo done with normal EF of 55 to 60%, no interatrial shunt noted. -Transcranial Dopplers negative. -Lower extremity Dopplers positive for acute right DVT. -Patient noted to have been started on high-dose thiamine and currently on we will continue oral thiamine on discharge. -Currently on Eliquis for secondary stroke  prophylaxis. -Patient seen by PT/OT recommending SNF placement. -Will need outpatient follow-up with neurology.  2.  Right lower extremity DVT -Noted on lower extremity Dopplers. -Continue Eliquis.  3.  Close right hip fracture status post IM nail 7/25 -Patient seen in consultation by orthopedics and patient underwent ORIF by orthopedics: Dr. Susa Simmonds 04/18/2023. -Initial recommendations was for full dose aspirin twice daily for DVT prophylaxis however patient now on Eliquis due to acute CVA and as such full dose aspirin discontinued. -Outpatient follow-up with orthopedics.  4.  Hypokalemia/hyponatremia -Potassium repleted.    5.  Normocytic anemia -H&H stable. -Status post IV iron. -Continue oral iron supplementation on discharge.  6.  Vitamin D deficiency -Continue vitamin D supplementation.    7.  Transaminitis -Acute hepatitis panel negative. -Patient noted to have denied alcohol use. -Resolved.  8.  Hyperlipidemia -LDL noted of 61. -Not on a statin at present.  9.  Hypertension -BP stable.  10.  Vitamin B12 deficiency -Continue vitamin B12 supplementation.    11.  Constipation -Continue current bowel regimen.    12.  3 mm pulmonary nodule -Outpatient follow-up.  13.  Pressure injury Pressure Injury 04/22/23 Sacrum Mid Stage 2 -  Partial thickness loss of dermis presenting as a shallow open injury with a red, pink wound bed without slough. blister (Active)  04/22/23 0230  Location: Sacrum  Location Orientation: Mid  Staging: Stage 2 -  Partial thickness loss of dermis presenting as a shallow open injury with a red, pink wound bed without slough.  Wound Description (Comments): blister  Present on Admission: No  DVT prophylaxis: Eliquis Code Status: Full Family Communication: Updated patient.  No family at bedside. Disposition: Patient medically stable.  SNF when bed available.  Status is: Inpatient Remains inpatient appropriate because: Unsafe  disposition.   Consultants:  Orthopedics: Dr. Susa Simmonds 04/18/2023 Wound care RN: Ria Bush 04/21/2023 Neurology: Dr.Khaliqdina 04/28/2023  Procedures:  CT right hip 04/18/2023 CT angiogram head and neck 04/27/2023 Chest x-ray 04/17/2023, 04/28/2023 Abdominal films 04/27/2023 Plain films of the skull 04/27/2023 Plain films of the right femur 04/18/2023 MRI brain 04/27/2023 2D echo 04/28/2023 Bilateral lower extremity Dopplers 04/28/2023 Transcranial Dopplers 04/28/2023 Plain films of the right hip and pelvis 04/17/2023 Cephalomedullary nail of the right hip 04/18/2023 per orthopedics: Dr. Susa Simmonds  Antimicrobials:  Anti-infectives (From admission, onward)    Start     Dose/Rate Route Frequency Ordered Stop   04/18/23 1800  ceFAZolin (ANCEF) IVPB 1 g/50 mL premix        1 g 100 mL/hr over 30 Minutes Intravenous Every 6 hours 04/18/23 1512 04/19/23 0715         Subjective: Patient sitting up at the side of the bed getting ready to work with therapy.  Denies any chest pain or shortness of breath.  No abdominal pain.    Objective: Vitals:   05/09/23 2007 05/10/23 0436 05/10/23 0729 05/10/23 1621  BP: (!) 142/78 128/84 131/73 130/75  Pulse: 88 87 87 78  Resp: 18 18 16 16   Temp: (!) 100.6 F (38.1 C)  99.9 F (37.7 C) 99.3 F (37.4 C)  TempSrc: Oral  Oral Oral  SpO2: 99% 99% 99% 99%  Weight:      Height:        Intake/Output Summary (Last 24 hours) at 05/10/2023 1823 Last data filed at 05/10/2023 1617 Gross per 24 hour  Intake 360 ml  Output 950 ml  Net -590 ml   Filed Weights   05/04/23 0500 05/05/23 0500 05/08/23 0500  Weight: 70.5 kg 70.5 kg 71.8 kg    Examination:  General exam: NAD. Respiratory system: CTAB.  No wheezes, no crackles, no rhonchi.  Fair air movement.  Speaking in full sentences.  Normal respiratory effort.  Cardiovascular system: RRR no murmurs rubs or gallops.  No JVD.  No lower extremity edema.  Gastrointestinal system: Abdomen is soft, nontender, nondistended,  positive bowel sounds.  No rebound.  No guarding.  Central nervous system: Alert and oriented. No focal neurological deficits. Extremities: Right hip with postop bandage in place.  Nontender to palpation.  Skin: No rashes, lesions or ulcers Psychiatry: Judgement and insight appear fair. Mood & affect appropriate.     Data Reviewed: I have personally reviewed following labs and imaging studies  CBC: No results for input(s): "WBC", "NEUTROABS", "HGB", "HCT", "MCV", "PLT" in the last 168 hours.   Basic Metabolic Panel: No results for input(s): "NA", "K", "CL", "CO2", "GLUCOSE", "BUN", "CREATININE", "CALCIUM", "MG", "PHOS" in the last 168 hours.   GFR: Estimated Creatinine Clearance: 73.4 mL/min (by C-G formula based on SCr of 1.03 mg/dL).  Liver Function Tests: No results for input(s): "AST", "ALT", "ALKPHOS", "BILITOT", "PROT", "ALBUMIN" in the last 168 hours.  CBG: Recent Labs  Lab 05/08/23 2023  GLUCAP 118*     No results found for this or any previous visit (from the past 240 hour(s)).       Radiology Studies: No results found.      Scheduled Meds:  apixaban  5 mg Oral BID   bisacodyl  10 mg Rectal Once   calcium  carbonate  1 tablet Oral Q breakfast   cholecalciferol  1,000 Units Oral Daily   vitamin B-12  1,000 mcg Oral Daily   docusate sodium  100 mg Oral BID   Ferrous Fumarate  1 tablet Oral Daily   multivitamin with minerals  1 tablet Oral Daily   polyethylene glycol  17 g Oral BID   senna  1 tablet Oral BID   thiamine  100 mg Oral Daily   Vitamin D (Ergocalciferol)  50,000 Units Oral Q7 days   Continuous Infusions:   LOS: 23 days    Time spent: 35 minutes    Ramiro Harvest, MD [facility]   To contact the attending provider between 7A-7P or the covering provider during after hours 7P-7A, please log into the web site [email or web address] and access using universal [place] password for that web site. If you do not have the password,  please call the hospital operator.  05/10/2023, 6:23 PM

## 2023-05-11 DIAGNOSIS — E876 Hypokalemia: Secondary | ICD-10-CM | POA: Diagnosis not present

## 2023-05-11 DIAGNOSIS — I639 Cerebral infarction, unspecified: Secondary | ICD-10-CM | POA: Diagnosis not present

## 2023-05-11 DIAGNOSIS — S72001P Fracture of unspecified part of neck of right femur, subsequent encounter for closed fracture with malunion: Secondary | ICD-10-CM | POA: Diagnosis not present

## 2023-05-11 DIAGNOSIS — L89152 Pressure ulcer of sacral region, stage 2: Secondary | ICD-10-CM

## 2023-05-11 DIAGNOSIS — E7800 Pure hypercholesterolemia, unspecified: Secondary | ICD-10-CM | POA: Diagnosis not present

## 2023-05-11 LAB — BASIC METABOLIC PANEL
Anion gap: 10 (ref 5–15)
BUN: 15 mg/dL (ref 8–23)
CO2: 27 mmol/L (ref 22–32)
Calcium: 8.2 mg/dL — ABNORMAL LOW (ref 8.9–10.3)
Chloride: 94 mmol/L — ABNORMAL LOW (ref 98–111)
Creatinine, Ser: 0.84 mg/dL (ref 0.61–1.24)
GFR, Estimated: >60 mL/min (ref >60)
Glucose, Bld: 89 mg/dL (ref 70–99)
Potassium: 3.9 mmol/L (ref 3.5–5.1)
Sodium: 131 mmol/L — ABNORMAL LOW (ref 135–145)

## 2023-05-11 MED ORDER — POLYETHYLENE GLYCOL 3350 17 G PO PACK: 17.0000 g | PACK | Freq: Two times a day (BID) | ORAL | Status: DC

## 2023-05-11 NOTE — TOC Transition Note (Signed)
Transition of Care [facility]) - CM/SW Discharge Note   Patient Details  Name: Scott Pruitt MRN: [id number] Date of Birth: September 04, 1961  Transition of Care [facility]) CM/SW Contact:  Deatra Robinson, [place] Phone Number: 05/11/2023, 3:28 PM   Clinical Narrative: pt for dc to [facility] today. Spoke to [place] with [facility] [phone] who confirmed they are prepared to admit pt this evening. Pt's SO [name] aware of dc and reports agreeable.   DC summary faxed to [phone] and [phone] per [facility] request. RN aware report to be called to Bradd Canary with [facility] [phone].   Packet complete including dc summary and signed narcotic scripts. Transport arranged with [employer] [facility] for 8pm pick up. SW signing off at dc.   Dellie Burns, MSW, LCSW [phone] (coverage)      Final next level of care: Skilled Nursing Facility Barriers to Discharge: No Barriers Identified   Patient Goals and CMS Choice      Discharge Placement                Patient chooses bed at: Other - please specify in the comment section below: ([facility]) Patient to be transferred to facility by: [facility] Name of family member notified: [name]/SO Patient and family notified of of transfer: 05/11/23  Discharge Plan and Services Additional resources added to the After Visit Summary for                                       Social Determinants of Health (SDOH) Interventions SDOH Screenings   Food Insecurity: Food Insecurity Present (04/18/2023)  Housing: Low Risk  (04/18/2023)  Transportation Needs: No Transportation Needs (04/18/2023)  Utilities: Not At Risk (04/28/2023)  Depression (PHQ2-9): Medium Risk (03/11/2020)  Social Connections: Unknown (02/02/2022)   Received from [facility], [facility]  Tobacco Use: Medium Risk (04/18/2023)     Readmission Risk Interventions     No data to display

## 2023-05-11 NOTE — Progress Notes (Addendum)
Pt taken by transport to [facility] by stretcher with his belongings. AVS given to transport. Report given to Blake Divine, LPN at [facility].

## 2023-05-11 NOTE — Discharge Summary (Signed)
Physician Discharge Summary  Scott Pruitt HYQ:[id number] DOB: 1961/04/04 DOA: 04/17/2023  PCP: Pcp, No  Admit date: 04/17/2023 Discharge date: 05/11/2023  Time spent: 60 minutes  Recommendations for Outpatient Follow-up:  Follow-up with Dr. Susa Pruitt, orthopedics in 2 weeks. Follow-up with [facility] neurology in 1 month. Follow-up with MD at SNF.   Discharge Diagnoses:  Principal Problem:   Closed right hip fracture [facility]) Active Problems:   Essential hypertension   Hyperlipidemia   Fall   Transaminitis   Hyponatremia   Vitamin D deficiency   Hypokalemia   Cognitive impairment   Acute CVA (cerebrovascular accident) (HCC)   Pressure injury of skin   Discharge Condition: Stable and improved.  Diet recommendation: Regular  Filed Weights   05/05/23 0500 05/08/23 0500 05/11/23 0500  Weight: 70.5 kg 71.8 kg 72.7 kg    History of present illness:  HPI per Dr Scott Pruitt is a 62 y.o. male with medical history significant for essential hypertension, hyperlipidemia who is admitted to [facility] on 04/17/2023 with acute right hip fracture after ground-level mechanical fall after presenting from home to [facility] ED complaining of right hip pain.    Patient reports tripping while attempting to ambulate in a hotel room on 04/16/23, resulting in a ground level fall during which the right hip was the principal point of contact with the floor below. As a result of this fall, the patient reports immediate development of sharp right hip pain, with radiation into the  right  groin. States that this pain has been constant since onset, with exacerbation when attempting to move the right lower extremity.  As a consequence of the associated intensity of this discomfort, reports that he is unable to bear weight on the right lower extremity at this time.  This is relative to baseline functional status in which the patient lives independently as well as baseline ambulatory status in which the  patient reports the ability to ambulate without assistance, including no need for support devices.    Denies any acute numbness or paresthesias in bilateral lower extremities, and confirms that right hip representations a native joint.   As a consequence of the above acute right hip discomfort and inability to bear weight on the right lower extremity as a consequence of associated poor pain control, the patient reports that he remained on the floor of his hotel room for approximately 24 hours before being able to reach the telephone and call for help.    The patient conveys that the above fall was unwitnessed, but he states that he did not hit head as a component of this fall, and denies any associated loss of consciousness.  Denies any preceding or associated chest pain, shortness of breath, diaphoresis, palpitations, nausea, vomiting, dizziness, presyncope, or syncope.  Denies any subsequent headache, neck pain, blurry vision, or diplopia.  He is prescribed a daily baby aspirin, but reports that he uses this only intermittently, noting no recent use of aspirin, and conveys that he is not on any additional blood thinners as an outpatient.   Denies any known history of coronary artery disease or CHF. denies any recent orthopnea, PND, or peripheral edema.            ED Course:  Vital signs in the ED were notable for the following: Afebrile; heart rates in the range of 88-98; systolic blood pressures in the 170s; respiratory rate 19-21, oxygen saturation 100% on room air.   Labs were notable for the following: CMP notable  for sodium 134, bicarbonate 24, creatinine 1.08 compared to most recent prior serum creatinine data point of 1.21 on 02/21/2023, BUN to creatinine ratio greater than 20, liver enzymes within the limits.  CPK 772.  CBC notable for white blood cell count 10,900 with 81% neutrophils, hemoglobin 14.1, platelet count 195.   Per my interpretation, EKG in ED demonstrated the following: Sinus  rhythm with heart rate 98, normal intervals, no evidence of T wave changes, and nonspecific less than 1 mm ST depression in V6, while otherwise demonstrating no evidence of ST changes.   Imaging in the ED, per corresponding formal radiology read, was notable for the following: Chest x-ray, 1 view, which showed no evidence of acute cardiopulmonary process, including no evidence of infiltrate, edema, effusion, or pneumothorax, showing evidence of chronic left fifth rib fracture.  Plain films of the right hip showed evidence of acute right intratrochanteric hip fracture.   EDP discussed patient's case with on-call orthopedic surgery, Dr. Susa Pruitt, who has requested CT hip for further evaluation, and conveys that orthopedic surgery will formally consult and see the patient in the morning, anticipating taking the patient to the OR on 04/18/2023.   CT of the right hip has been ordered, Therazole currently pending.   While in the ED, the following were administered: Fentanyl 50 mcg IV x 1, normal saline x 500 cc bolus.   Subsequently, the patient was admitted for further evaluation management of presenting acute right hip fracture in the setting of ground-level mechanical fall, with presenting labs notable for mildly elevated CPK, mild leukocytosis, clinical evidence to suggest mild dehydration.     Hospital Course:  #1 acute CVA of right temporal and left frontoparietal region/cognitive decline concerns for dementia -It is noted that overall patient had a mental decline over the past several months but due to persistent concerns of decline MRI brain was done. -MRI brain obtained concerning for acute CVA. -CT angiogram head and neck done with some vertebral stenosis otherwise no LVO. -Hemoglobin A1c at 5.4.  LDL of 61. -2D echo done with normal EF of 55 to 60%, no interatrial shunt noted. -Transcranial Dopplers negative. -Lower extremity Dopplers positive for acute right DVT. -Patient noted to have been  started on high-dose thiamine and subsequently transition to oral thiamine which he will be discharged on. -Currently on Eliquis for secondary stroke prophylaxis. -Patient seen by PT/OT recommending SNF placement. -Will need outpatient follow-up with neurology.   2.  Right lower extremity DVT -Noted on lower extremity Dopplers. -Patient maintained on Eliquis.     3.  Close right hip fracture status post IM nail 7/25 -Patient seen in consultation by orthopedics and patient underwent ORIF by orthopedics: Dr. Susa Pruitt 04/18/2023. -Initial recommendations was for full dose aspirin twice daily for DVT prophylaxis however patient now on Eliquis due to acute CVA and as such full dose aspirin discontinued. -Outpatient follow-up with orthopedics.   4.  Hypokalemia/hyponatremia -Electrolytes repleted.   5.  Normocytic anemia -H&H stable. -Status post IV iron. -Hemoglobin remained stable at 9.7 by day of discharge.   -Patient maintained on oral iron supplementation.    6.  Vitamin D deficiency -Continue vitamin D supplementation.     7.  Transaminitis -Acute hepatitis panel negative. -Patient noted to have denied alcohol use. -Resolved.   8.  Hyperlipidemia -LDL noted of 61. -Not on a statin at present. -Outpatient follow-up.   9.  Hypertension -Remained stable throughout the hospitalization.    10.  Vitamin B12 deficiency -Patient maintained  on vitamin B12 supplementation.    11.  Constipation -Patient placed on MiraLAX daily as well as Senokot-S twice daily.   -Patient received a dose of Dulcolax with good BM.    12.  3 mm pulmonary nodule -Outpatient follow-up.   13.  Pressure injury, POA Pressure Injury 04/22/23 Sacrum Mid Stage 2 -  Partial thickness loss of dermis presenting as a shallow open injury with a red, pink wound bed without slough. blister (Active)  04/22/23 0230  Location: Sacrum  Location Orientation: Mid  Staging: Stage 2 -  Partial thickness loss of dermis  presenting as a shallow open injury with a red, pink wound bed without slough.  Wound Description (Comments): blister  Present on Admission: No     Procedures: CT right hip 04/18/2023 CT angiogram head and neck 04/27/2023 Chest x-ray 04/17/2023, 04/28/2023 Abdominal films 04/27/2023 Plain films of the skull 04/27/2023 Plain films of the right femur 04/18/2023 MRI brain 04/27/2023 2D echo 04/28/2023 Bilateral lower extremity Dopplers 04/28/2023 Transcranial Dopplers 04/28/2023 Plain films of the right hip and pelvis 04/17/2023 Cephalomedullary nail of the right hip 04/18/2023 per orthopedics: Dr. Susa Pruitt  Consultations: Orthopedics: Dr. Susa Pruitt 04/18/2023 Wound care RN: Ria Bush 04/21/2023 Neurology: Dr.Khaliqdina 04/28/2023    Discharge Exam: Vitals:   05/11/23 0322 05/11/23 0803  BP: 117/81 122/78  Pulse: 82 99  Resp: 18 16  Temp: 99.2 F (37.3 C) 98.2 F (36.8 C)  SpO2: 98% 99%    General: NAD Cardiovascular: RRR no murmurs rubs or gallops.  No JVD.  No lower extremity edema. Respiratory: Clear to auscultation bilaterally.  No wheezes, no crackles, no rhonchi.  Fair air movement.  Speaking in full sentences.  Discharge Instructions   Discharge Instructions     Ambulatory referral to Neurology   Complete by: As directed    Follow up with stroke clinic NP at [facility] in about 4 weeks. Thanks.   Diet general   Complete by: As directed    Discharge instructions   Complete by: As directed    1)Please take prescribed medications as instructed 2)Do  a CBC and BMP tests in a week 3)Follow up with orthopedics as an outpatient.  Name and number the provider has been attached   Discharge wound care:   Complete by: As directed    As per wound care   Discharge wound care:   Complete by: As directed    Pressure Injury 04/22/23 Sacrum Mid Stage 2 -  Partial thickness loss of dermis presenting as a shallow open injury with a red, pink wound bed without slough. blister 19 days      Wound Care  Orders (From admission, onward)      Start     Ordered    04/21/23 1519    Wound care  Every other day    Comments: Wound care to right medial thigh abrasion: Cleanse with NS, pat dry. Cover with silicone foam dressing. Change every other day.  04/21/23 1519    04/18/23 1513    Reinforce dressing  Until discontinued   Increase activity slowly   Complete by: As directed    Increase activity slowly   Complete by: As directed       Allergies as of 05/11/2023   No Known Allergies      Medication List     STOP taking these medications    lisinopril-hydrochlorothiazide 20-25 MG tablet Commonly known as: ZESTORETIC       TAKE these medications    apixaban  5 MG Tabs tablet Commonly known as: ELIQUIS Take 1 tablet (5 mg total) by mouth 2 (two) times daily.   calcium carbonate 1250 (500 Ca) MG tablet Commonly known as: OS-CAL - dosed in mg of elemental calcium Take 1 tablet (1,250 mg total) by mouth daily with breakfast.   cyanocobalamin 1000 MCG tablet Take 1 tablet (1,000 mcg total) by mouth daily.   docusate sodium 100 MG capsule Commonly known as: COLACE Take 1 capsule (100 mg total) by mouth 2 (two) times daily as needed for mild constipation.   Ferrous Fumarate 324 (106 Fe) MG Tabs tablet Commonly known as: HEMOCYTE - 106 mg FE Take 1 tablet (106 mg of iron total) by mouth daily.   multivitamin with minerals Tabs tablet Take 1 tablet by mouth daily.   oxyCODONE 5 MG immediate release tablet Commonly known as: Roxicodone Take 1-2 tablets (5-10 mg total) by mouth every 6 (six) hours as needed for severe pain.   polyethylene glycol 17 g packet Commonly known as: MIRALAX / GLYCOLAX Take 17 g by mouth daily.   senna 8.6 MG Tabs tablet Commonly known as: SENOKOT Take 1 tablet (8.6 mg total) by mouth 2 (two) times daily.   thiamine 100 MG tablet Commonly known as: VITAMIN B1 Take 1 tablet (100 mg total) by mouth daily.   tizanidine 2 MG capsule Commonly  known as: Zanaflex Take 1 capsule (2 mg total) by mouth 3 (three) times daily as needed for muscle spasms.   Vitamin D (Ergocalciferol) 1.25 MG (50000 UNIT) Caps capsule Commonly known as: DRISDOL Take 1 capsule (50,000 Units total) by mouth every 7 (seven) days for 10 doses.               Discharge Care Instructions  (From admission, onward)           Start     Ordered   05/11/23 0000  Discharge wound care:       Comments: Pressure Injury 04/22/23 Sacrum Mid Stage 2 -  Partial thickness loss of dermis presenting as a shallow open injury with a red, pink wound bed without slough. blister 19 days      Wound Care Orders (From admission, onward)      Start     Ordered    04/21/23 1519    Wound care  Every other day    Comments: Wound care to right medial thigh abrasion: Cleanse with NS, pat dry. Cover with silicone foam dressing. Change every other day.  04/21/23 1519    04/18/23 1513    Reinforce dressing  Until discontinued   05/11/23 1437   05/01/23 0000  Discharge wound care:       Comments: As per wound care   05/01/23 1429           No Known Allergies  Follow-up Information     Terance Hart, MD Follow up in 2 week(s).   Specialty: Orthopedic Surgery Why: Please call the clinic and schedule a follow up visit in 2 weeks. Contact information: [address] [place] [address] [phone]         [facility]. Schedule an appointment as soon as possible for a visit in 1 month(s).   Specialty: Neurology Why: stroke clinic Contact information: [address] [place] [address] [phone]        MD AT SNF Follow up.  The results of significant diagnostics from this hospitalization (including imaging, microbiology, ancillary and laboratory) are listed below for reference.    Significant Diagnostic Studies: VAS [place] TRANSCRANIAL DOPPLER W  BUBBLES  Result Date: 05/01/2023  Transcranial Doppler with Bubble Patient Name:  Scott Pruitt  Date of Exam:   04/28/2023 Medical Rec #: [id number]     Accession #:    [id number] Date of Birth: 11-Jan-1961     Patient Gender: M Patient Age:   76 years Exam Location:  [facility] Procedure:      VAS [place] TRANSCRANIAL DOPPLER W BUBBLES Referring Phys: Scheryl Marten XU --------------------------------------------------------------------------------  Indications: Stroke. History: RLE DVT s/p hip surgery/repair. Limitations: limited valsalva due to patient condition Comparison Study: No previous exams Performing Technologist: Jody Hill RVT, RDMS  Examination Guidelines: A complete evaluation includes B-mode imaging, spectral Doppler, color Doppler, and power Doppler as needed of all accessible portions of each vessel. Bilateral testing is considered an integral part of a complete examination. Limited examinations for reoccurring indications may be performed as noted.  Summary: No HITS at rest or during Valsalva. Negative transcranial Doppler Bubble study with no evidence of right to left intracardiac communication.  A vascular evaluation was performed. The right middle cerebral artery was studied. An IV was inserted into the patient's right forearm. Verbal informed consent was obtained.  *See table(s) above for TCD measurements and observations.  Diagnosing physician: Delia Heady MD Electronically signed by Delia Heady MD on 05/01/2023 at 2:30:13 PM.    Final    VAS [place] LOWER EXTREMITY VENOUS (DVT)  Result Date: 04/28/2023  Lower Venous DVT Study Patient Name:  Scott Pruitt  Date of Exam:   04/28/2023 Medical Rec #: [id number]     Accession #:    [id number] Date of Birth: 1961-07-15     Patient Gender: M Patient Age:   55 years Exam Location:  [facility] Procedure:      VAS [place] LOWER EXTREMITY VENOUS (DVT) Referring Phys: Scheryl Marten XU --------------------------------------------------------------------------------   Indications: Stroke.  Risk Factors: Recent fall S/P IM nailing of right hip on 04/18/23. Limitations: Patient positioning. Comparison Study: No previous exams Performing Technologist: Jody Hill RVT, RDMS  Examination Guidelines: A complete evaluation includes B-mode imaging, spectral Doppler, color Doppler, and power Doppler as needed of all accessible portions of each vessel. Bilateral testing is considered an integral part of a complete examination. Limited examinations for reoccurring indications may be performed as noted. The reflux portion of the exam is performed with the patient in reverse Trendelenburg.  +---------+---------------+---------+-----------+----------+--------------+ RIGHT    CompressibilityPhasicitySpontaneityPropertiesThrombus Aging +---------+---------------+---------+-----------+----------+--------------+ CFV      Full           Yes      Yes                                 +---------+---------------+---------+-----------+----------+--------------+ SFJ      Full                                                        +---------+---------------+---------+-----------+----------+--------------+ FV Prox  None           No       No  Acute          +---------+---------------+---------+-----------+----------+--------------+ FV Mid   None           No       No                   Acute          +---------+---------------+---------+-----------+----------+--------------+ FV DistalNone           No       No                   Acute          +---------+---------------+---------+-----------+----------+--------------+ PFV      Full                                                        +---------+---------------+---------+-----------+----------+--------------+ POP      None           No       No                   Acute          +---------+---------------+---------+-----------+----------+--------------+ PTV      None           No        No                   Acute          +---------+---------------+---------+-----------+----------+--------------+ PERO     None           No       No                   Acute          +---------+---------------+---------+-----------+----------+--------------+   +---------+---------------+---------+-----------+----------+-------------------+ LEFT     CompressibilityPhasicitySpontaneityPropertiesThrombus Aging      +---------+---------------+---------+-----------+----------+-------------------+ CFV      Full           Yes      Yes                                      +---------+---------------+---------+-----------+----------+-------------------+ SFJ      Full                                                             +---------+---------------+---------+-----------+----------+-------------------+ FV Prox  Full           Yes      Yes                                      +---------+---------------+---------+-----------+----------+-------------------+ FV Mid   Full           Yes      Yes                                      +---------+---------------+---------+-----------+----------+-------------------+  FV DistalFull           Yes      Yes                                      +---------+---------------+---------+-----------+----------+-------------------+ PFV      Full                                                             +---------+---------------+---------+-----------+----------+-------------------+ POP      Full           Yes      Yes                                      +---------+---------------+---------+-----------+----------+-------------------+ PTV      Full                                                             +---------+---------------+---------+-----------+----------+-------------------+ PERO                                                  Not well visualized  +---------+---------------+---------+-----------+----------+-------------------+     Summary: BILATERAL: -No evidence of popliteal cyst, bilaterally. RIGHT: - Findings consistent with acute deep vein thrombosis involving the right femoral vein, right popliteal vein, right posterior tibial veins, and right peroneal veins.  LEFT: - There is no evidence of deep vein thrombosis in the lower extremity.  *See table(s) above for measurements and observations. Electronically signed by Heath Lark on 04/28/2023 at 5:02:53 PM.    Final    ECHOCARDIOGRAM COMPLETE  Result Date: 04/28/2023    ECHOCARDIOGRAM REPORT   Patient Name:   Scott Pruitt Date of Exam: 04/28/2023 Medical Rec #:  [id number]    Height:       69.0 in Accession #:    [id number]   Weight:       158.1 lb Date of Birth:  16-Jun-1961    BSA:          1.869 m Patient Age:    62 years     BP:           103/84 mmHg Patient Gender: M            HR:           81 bpm. Exam Location:  Inpatient Procedure: 2D Echo, Cardiac Doppler and Color Doppler Indications:    Stroke I63.9  History:        Patient has no prior history of Echocardiogram examinations.                 Risk Factors:Hypertension, Dyslipidemia and Former Smoker.  Sonographer:    Dondra Prader RVT RCS Referring Phys: [id number] [facility]  Sonographer Comments: Technically challenging study due to limited acoustic windows. Image acquisition challenging  due to patient body habitus. Unable to measure atrial diameter due to body habitus IMPRESSIONS  1. Left ventricular ejection fraction, by estimation, is 55 to 60%. The left ventricle has normal function. The left ventricle has no regional wall motion abnormalities. Left ventricular diastolic parameters were normal.  2. Right ventricular systolic function is normal. The right ventricular size is normal. Tricuspid regurgitation signal is inadequate for assessing PA pressure.  3. The mitral valve is normal in structure. No evidence of mitral valve regurgitation. No  evidence of mitral stenosis.  4. The aortic valve is tricuspid. Aortic valve regurgitation is not visualized. No aortic stenosis is present.  5. Aortic dilatation noted. There is borderline dilatation of the aortic root, measuring 37 mm.  6. The inferior vena cava is normal in size with greater than 50% respiratory variability, suggesting right atrial pressure of 3 mmHg. Comparison(s): No prior Echocardiogram. FINDINGS  Left Ventricle: Left ventricular ejection fraction, by estimation, is 55 to 60%. The left ventricle has normal function. The left ventricle has no regional wall motion abnormalities. The left ventricular internal cavity size was normal in size. There is  no left ventricular hypertrophy. Left ventricular diastolic parameters were normal. Right Ventricle: The right ventricular size is normal. No increase in right ventricular wall thickness. Right ventricular systolic function is normal. Tricuspid regurgitation signal is inadequate for assessing PA pressure. Left Atrium: Left atrial size was normal in size. Right Atrium: Right atrial size was normal in size. Pericardium: There is no evidence of pericardial effusion. Mitral Valve: The mitral valve is normal in structure. No evidence of mitral valve regurgitation. No evidence of mitral valve stenosis. Tricuspid Valve: The tricuspid valve is not well visualized. Tricuspid valve regurgitation is not demonstrated. No evidence of tricuspid stenosis. Aortic Valve: The aortic valve is tricuspid. Aortic valve regurgitation is not visualized. No aortic stenosis is present. Aortic valve mean gradient measures 2.0 mmHg. Aortic valve peak gradient measures 4.2 mmHg. Aortic valve area, by VTI measures 3.40 cm. Pulmonic Valve: The pulmonic valve was not well visualized. Pulmonic valve regurgitation is not visualized. No evidence of pulmonic stenosis. Aorta: Aortic dilatation noted. There is borderline dilatation of the aortic root, measuring 37 mm. Venous: The  inferior vena cava is normal in size with greater than 50% respiratory variability, suggesting right atrial pressure of 3 mmHg. IAS/Shunts: No atrial level shunt detected by color flow Doppler.  LEFT VENTRICLE PLAX 2D LVIDd:         4.50 cm   Diastology LVIDs:         3.10 cm   LV e' medial:    7.18 cm/s LV PW:         1.10 cm   LV E/e' medial:  8.3 LV IVS:        1.10 cm   LV e' lateral:   15.60 cm/s LVOT diam:     2.10 cm   LV E/e' lateral: 3.8 LV SV:         55 LV SV Index:   29 LVOT Area:     3.46 cm  RIGHT VENTRICLE             IVC RV S prime:     14.00 cm/s  IVC diam: 1.30 cm TAPSE (M-mode): 1.6 cm LEFT ATRIUM         Index       RIGHT ATRIUM           Index LA diam:    2.40 cm 1.28  cm/m  RA Area:     10.60 cm                                 RA Volume:   21.70 ml  11.61 ml/m  AORTIC VALVE                    PULMONIC VALVE AV Area (Vmax):    3.57 cm     PV Vmax:       0.89 m/s AV Area (Vmean):   3.44 cm     PV Peak grad:  3.2 mmHg AV Area (VTI):     3.40 cm AV Vmax:           102.00 cm/s AV Vmean:          64.400 cm/s AV VTI:            0.162 m AV Peak Grad:      4.2 mmHg AV Mean Grad:      2.0 mmHg LVOT Vmax:         105.00 cm/s LVOT Vmean:        63.900 cm/s LVOT VTI:          0.159 m LVOT/AV VTI ratio: 0.98  AORTA Ao Root diam: 3.70 cm MITRAL VALVE MV Area (PHT): 4.06 cm    SHUNTS MV Decel Time: 187 msec    Systemic VTI:  0.16 m MV E velocity: 59.70 cm/s  Systemic Diam: 2.10 cm MV A velocity: 55.50 cm/s MV E/A ratio:  1.08 Vishnu Priya Mallipeddi Electronically signed by Winfield Rast Mallipeddi Signature Date/Time: 04/28/2023/3:09:07 PM    Final    DG CHEST PORT 1 VIEW  Result Date: 04/28/2023 CLINICAL DATA:  Leukocytosis EXAM: PORTABLE CHEST 1 VIEW COMPARISON:  04/17/2023 FINDINGS: The heart size and mediastinal contours are within normal limits. Both lungs are clear. The visualized skeletal structures are unremarkable. IMPRESSION: No active disease. Electronically Signed   By: Paulina Fusi M.D.    On: 04/28/2023 10:07   CT ANGIO HEAD NECK W WO CM  Result Date: 04/28/2023 CLINICAL DATA:  Follow-up exam for stroke. EXAM: CT ANGIOGRAPHY HEAD AND NECK WITH AND WITHOUT CONTRAST TECHNIQUE: Multidetector CT imaging of the head and neck was performed using the standard protocol during bolus administration of intravenous contrast. Multiplanar CT image reconstructions and MIPs were obtained to evaluate the vascular anatomy. Carotid stenosis measurements (when applicable) are obtained utilizing NASCET criteria, using the distal internal carotid diameter as the denominator. RADIATION DOSE REDUCTION: This exam was performed according to the departmental dose-optimization program which includes automated exposure control, adjustment of the mA and/or kV according to patient size and/or use of iterative reconstruction technique. CONTRAST:  75mL OMNIPAQUE IOHEXOL 350 MG/ML SOLN COMPARISON:  Prior study from earlier the same day. FINDINGS: CT HEAD FINDINGS Brain: Cerebral volume within normal limits. 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 Previously identified punctate infarcts not visible by CT. No other acute large vessel territory infarct. No intracranial hemorrhage. No mass lesion or midline shift. No hydrocephalus or extra-axial fluid collection. Vascular: No hyperdense vessel. Skull: Small lipoma noted at the scalp vertex. No acute abnormality. Calvarium intact. Sinuses/Orbits: Globes and orbital soft tissues demonstrate no acute abnormality. Paranasal sinuses are largely clear. No mastoid effusion. Other: None. Review of the MIP images confirms the above findings CTA NECK FINDINGS Aortic arch: Visualized arch  within normal limits for caliber. Origin of the great vessels incompletely visualized. Right carotid system: Right common and internal carotid arteries are patent without dissection. Mild atheromatous irregularity about the right carotid  bulb without stenosis. Left carotid system: Left common and internal carotid arteries are patent without dissection. Mild atheromatous irregularity about the left carotid bulb without stenosis. Vertebral arteries: Both vertebral arteries arise from the subclavian arteries. No proximal subclavian artery stenosis. Right vertebral artery dominant. Atheromatous plaque at the origin of the left vertebral artery with severe ostial stenosis. Vertebral arteries otherwise patent without stenosis or dissection. Skeleton: No discrete or worrisome osseous lesions. Moderate spondylosis present at C6-7. Other neck: No other acute finding. Upper chest: Mild emphysema. 3 mm left upper lobe pulmonary nodule noted (series 9, image 12). Review of the MIP images confirms the above findings CTA HEAD FINDINGS Anterior circulation: Both internal carotid arteries are patent to the termini without stenosis. A1 segments, anterior to artery complex common anterior cerebral arteries widely patent. No M1 stenosis or occlusion. No proximal MCA branch occlusion or high-grade stenosis. Distal MCA branches perfused and symmetric. Posterior circulation: Both V4 segments widely patent. Both PICA patent. Basilar widely patent without stenosis. Superior cerebellar arteries patent bilaterally. Right PCA supplied via the basilar as well as a prominent right posterior communicating artery. Fetal type origin of the left PCA. Both PCAs patent without stenosis. Venous sinuses: Patent allowing for timing the contrast bolus. Anatomic variants: As above.  No aneurysm. Review of the MIP images confirms the above findings IMPRESSION: CT HEAD IMPRESSION: 1. Previously identified punctate infarcts not visible by CT. No other acute intracranial abnormality. 2. 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 CTA HEAD AND NECK IMPRESSION: 1. Negative CTA for large vessel occlusion or other emergent  finding. 2. 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 3. Otherwise wide patency of the major arterial vasculature of the head and neck. No other hemodynamically significant or correctable stenosis. 4. 3 mm left upper lobe pulmonary nodule, indeterminate. Per Fleischner Society Guidelines, a non-contrast Chest CT at 12 months is optional. If performed and the nodule is stable at 12 months, no further follow-up is recommended. These guidelines do not apply to immunocompromised patients and patients with cancer. Follow up in patients with significant comorbidities as clinically warranted. For lung cancer screening, adhere to Lung-RADS guidelines. Reference: Radiology. 2017; 284(1):228-43. Aortic Atherosclerosis (ICD10-I70.0) and Emphysema (ICD10-J43.9). Electronically Signed   By: Rise Mu M.D.   On: 04/28/2023 03:40   MR BRAIN WO CONTRAST  Result Date: 04/27/2023 CLINICAL DATA:  Tremor.  Memory impairment. EXAM: MRI HEAD WITHOUT CONTRAST TECHNIQUE: Multiplanar, multiecho pulse sequences of the brain and surrounding structures were obtained without intravenous contrast. COMPARISON:  None Available. FINDINGS: Brain: There are 2 punctate acute infarctions. One is present along the anterolateral margin of the temporal horn of the right lateral ventricle within the temporal lobe. The other is within the left frontoparietal vertex subcortical white matter. Small acute infarctions in these locations suggest embolic disease from the heart or ascending aorta. There chronic small-vessel ischemic changes affecting the pons. No focal cerebellar insult. Cerebral hemispheres otherwise show chronic small-vessel ischemic change of the white matter with a few old small vessel infarctions in the thalami and basal ganglia. No large vessel territory stroke. No mass, hemorrhage, hydrocephalus or extra-axial collection. Vascular: Major vessels at the base of the brain show flow.  Skull and upper  cervical spine: Negative Sinuses/Orbits: Clear/normal.  Some dysconjugate gaze. Other: None IMPRESSION: 1. Two punctate acute infarctions, in the right temporal lobe and the left frontoparietal vertex subcortical white matter. 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 2. Chronic small-vessel ischemic changes elsewhere throughout the brain as outlined above. Electronically Signed   By: Paulina Fusi M.D.   On: 04/27/2023 17:27   DG Abd Portable 1V  Result Date: 04/27/2023 CLINICAL DATA:  Foreign body in digestive tract EXAM: PORTABLE ABDOMEN - 1 VIEW SKULL - 2 VIEW COMPARISON:  None Available. FINDINGS: No radiopaque foreign body in the imaged portions of the skull and upper cervical spine. The bowel gas pattern is normal. No radio-opaque calculi or other significant radiographic abnormality are seen. IMPRESSION: No metallic foreign body Electronically Signed   By: Lorenza Cambridge M.D.   On: 04/27/2023 15:18   DG Skull 1-3 Views  Result Date: 04/27/2023 CLINICAL DATA:  Foreign body in digestive tract EXAM: PORTABLE ABDOMEN - 1 VIEW SKULL - 2 VIEW COMPARISON:  None Available. FINDINGS: No radiopaque foreign body in the imaged portions of the skull and upper cervical spine. The bowel gas pattern is normal. No radio-opaque calculi or other significant radiographic abnormality are seen. IMPRESSION: No metallic foreign body Electronically Signed   By: Lorenza Cambridge M.D.   On: 04/27/2023 15:18   DG FEMUR, MIN 2 VIEWS RIGHT  Result Date: 04/18/2023 CLINICAL DATA:  RIGHT femur fracture EXAM: RIGHT FEMUR 2 VIEWS COMPARISON:  Radiograph 04/17/2023 FINDINGS: Intramedullary nail fixation of the femur. Dynamic hip screw spans the femoral neck. IMPRESSION: No complication following intramedullary nail fixation femoral neck fracture Electronically Signed   By: Genevive Bi M.D.   On: 04/18/2023 17:39   CT Hip Right Wo Contrast  Result Date: 04/18/2023 CLINICAL DATA:  Trauma the  right hip. EXAM: CT OF THE RIGHT HIP WITHOUT CONTRAST TECHNIQUE: Multidetector CT imaging of the right hip was performed according to the standard protocol. Multiplanar CT image reconstructions were also generated. RADIATION DOSE REDUCTION: This exam was performed according to the departmental dose-optimization program which includes automated exposure control, adjustment of the mA and/or kV according to patient size and/or use of iterative reconstruction technique. COMPARISON:  Right knee radiograph dated 04/17/2023. FINDINGS: Bones/Joint/Cartilage There is a comminuted minimally displaced inter trochanteric fracture of the right femur. No other acute fracture. The bones are osteopenic. There is no dislocation. No joint effusion. Ligaments Suboptimally assessed by CT. Muscles and Tendons No acute findings.  No intramuscular fluid collection or hematoma. Soft tissues Aortoiliac atherosclerotic disease. The soft tissues are unremarkable. IMPRESSION: 1. Comminuted intertrochanteric fracture of the right femur. 2.  Aortic Atherosclerosis (ICD10-I70.0). Electronically Signed   By: Elgie Collard M.D.   On: 04/18/2023 01:58   DG Hip Unilat W or Wo Pelvis 2-3 Views Right  Addendum Date: 04/18/2023   ADDENDUM REPORT: 04/18/2023 01:54 ADDENDUM: The impression should read: Right intratrochanteric femoral fracture is noted. Electronically Signed   By: Alcide Clever M.D.   On: 04/18/2023 01:54   Result Date: 04/18/2023 CLINICAL DATA:  Recent fall with right hip pain, initial encounter EXAM: DG HIP (WITH OR WITHOUT PELVIS) 3V RIGHT COMPARISON:  None Available. FINDINGS: Right femoral head is well seated. Intratrochanteric fracture is noted with only mild displacement. Pelvic ring as visualized is within normal limits. IMPRESSION: Intratrochanteric Electronically Signed: By: Alcide Clever M.D. On: 04/17/2023 21:06   DG Chest Portable 1 View  Result Date: 04/17/2023 CLINICAL DATA:  fall EXAM:  PORTABLE CHEST 1 VIEW  COMPARISON:  CXR 09/12/18 FINDINGS: The heart size and mediastinal contours are within normal limits. Both lungs are clear. Chronic fracture of the lateral left fifth rib. IMPRESSION: 1.   No focal airspace opacity. 2.  Chronic left fifth rib fracture. Electronically Signed   By: Lorenza Cambridge M.D.   On: 04/17/2023 21:09    Microbiology: No results found for this or any previous visit (from the past 240 hour(s)).   Labs: Basic Metabolic Panel: Recent Labs  Lab 05/11/23 0018  NA 131*  K 3.9  CL 94*  CO2 27  GLUCOSE 89  BUN 15  CREATININE 0.84  CALCIUM 8.2*   Liver Function Tests: No results for input(s): "AST", "ALT", "ALKPHOS", "BILITOT", "PROT", "ALBUMIN" in the last 168 hours. No results for input(s): "LIPASE", "AMYLASE" in the last 168 hours. No results for input(s): "AMMONIA" in the last 168 hours. CBC: No results for input(s): "WBC", "NEUTROABS", "HGB", "HCT", "MCV", "PLT" in the last 168 hours. Cardiac Enzymes: No results for input(s): "CKTOTAL", "CKMB", "CKMBINDEX", "TROPONINI" in the last 168 hours. BNP: BNP (last 3 results) No results for input(s): "BNP" in the last 8760 hours.  ProBNP (last 3 results) No results for input(s): "PROBNP" in the last 8760 hours.  CBG: Recent Labs  Lab 05/08/23 2023  GLUCAP 118*       Signed:  Ramiro Harvest MD.  [facility] 05/11/2023, 3:02 PM

## 2023-05-11 NOTE — Plan of Care (Signed)
  Problem: Ischemic Stroke/TIA Tissue Perfusion: Goal: Complications of ischemic stroke/TIA will be minimized Outcome: Progressing   Problem: Coping: Goal: Will verbalize positive feelings about self Outcome: Progressing Goal: Will identify appropriate support needs Outcome: Progressing   Problem: Coping: Goal: Will verbalize positive feelings about self Outcome: Progressing Goal: Will identify appropriate support needs Outcome: Progressing   Problem: Health Behavior/Discharge Planning: Goal: Ability to manage health-related needs will improve Outcome: Progressing Goal: Goals will be collaboratively established with patient/family Outcome: Progressing   Problem: Coping: Goal: Level of anxiety will decrease Outcome: Progressing   Problem: Coping: Goal: Level of anxiety will decrease Outcome: Progressing   Problem: Coping: Goal: Level of anxiety will decrease Outcome: Progressing

## 2023-05-14 ENCOUNTER — Ambulatory Visit: Payer: Self-pay | Admitting: Student

## 2023-05-17 NOTE — Progress Notes (Addendum)
Today's date is 05/17/2023:  Patient's wallet was found in room 5C-21 by the cleaning person.  It was handed to this nurse.  Called patient and patient's significant other to inform of found wallet.    Spoke with [facility] [phone] and informed her of found wallet.  She said she'll stop by over the next few days to pick it up.    Wallet contains numerous cards and $24.00 in cash.     Wallet is in top drawer nearest the tele monitors.

## 2023-09-27 DIAGNOSIS — L89220 Pressure ulcer of left hip, unstageable: Secondary | ICD-10-CM | POA: Diagnosis not present

## 2023-09-29 DIAGNOSIS — R531 Weakness: Secondary | ICD-10-CM | POA: Diagnosis not present

## 2023-09-30 ENCOUNTER — Telehealth: Payer: Self-pay

## 2023-09-30 NOTE — Telephone Encounter (Signed)
**Note De-identified  [place] Obfuscation** Please advise 

## 2023-10-02 NOTE — Telephone Encounter (Signed)
 Copied from CRM [id number]. Topic: Clinical - Home Health Verbal Orders >> Oct 02, 2023  9:12 AM Franky GRADE wrote: Caller/Agency: [facility] Callback Number: [phone] Service Requested: PT was recently discharge with home health orders from the hospital, pt does not have a pcp at this time but is scheduled for a new pt appt with Dr. Lucius later this month. Zebedee is trying to see if the provider would be willing to follow the home health. The patient cannot receive the home health care until a PCP is designated to follow it. Zebedee would like a call back regardless if answer is yes or no. Zebedee is calling to follow up on this request from yesterday.   Frequency:  Any new concerns about the patient? No     I spoke with Corean Lucius, NP in regards to message above and returned Theresa's call letting her know Corean is unable to sign off on orders before seeing patient. Pt has the earliest New Patient appointment available. Zebedee verbalized understanding.

## 2023-10-04 ENCOUNTER — Ambulatory Visit: Payer: No Typology Code available for payment source | Admitting: Family

## 2023-10-06 DIAGNOSIS — Z86718 Personal history of other venous thrombosis and embolism: Secondary | ICD-10-CM | POA: Diagnosis not present

## 2023-10-06 DIAGNOSIS — Z7982 Long term (current) use of aspirin: Secondary | ICD-10-CM | POA: Diagnosis not present

## 2023-10-06 DIAGNOSIS — I10 Essential (primary) hypertension: Secondary | ICD-10-CM | POA: Diagnosis not present

## 2023-10-06 DIAGNOSIS — R531 Weakness: Secondary | ICD-10-CM | POA: Diagnosis not present

## 2023-10-06 DIAGNOSIS — L97829 Non-pressure chronic ulcer of other part of left lower leg with unspecified severity: Secondary | ICD-10-CM | POA: Diagnosis not present

## 2023-10-06 DIAGNOSIS — E785 Hyperlipidemia, unspecified: Secondary | ICD-10-CM | POA: Diagnosis not present

## 2023-10-06 DIAGNOSIS — Z5982 Transportation insecurity: Secondary | ICD-10-CM | POA: Diagnosis not present

## 2023-10-06 DIAGNOSIS — M86652 Other chronic osteomyelitis, left thigh: Secondary | ICD-10-CM | POA: Diagnosis not present

## 2023-10-06 DIAGNOSIS — R627 Adult failure to thrive: Secondary | ICD-10-CM | POA: Diagnosis not present

## 2023-10-06 DIAGNOSIS — Z79899 Other long term (current) drug therapy: Secondary | ICD-10-CM | POA: Diagnosis not present

## 2023-10-06 DIAGNOSIS — Z8673 Personal history of transient ischemic attack (TIA), and cerebral infarction without residual deficits: Secondary | ICD-10-CM | POA: Diagnosis not present

## 2023-10-06 DIAGNOSIS — L03116 Cellulitis of left lower limb: Secondary | ICD-10-CM | POA: Diagnosis not present

## 2023-10-06 DIAGNOSIS — G4700 Insomnia, unspecified: Secondary | ICD-10-CM | POA: Diagnosis not present

## 2023-10-06 DIAGNOSIS — Z7901 Long term (current) use of anticoagulants: Secondary | ICD-10-CM | POA: Diagnosis not present

## 2023-10-06 DIAGNOSIS — Z5901 Sheltered homelessness: Secondary | ICD-10-CM | POA: Diagnosis not present

## 2023-10-07 DIAGNOSIS — L97121 Non-pressure chronic ulcer of left thigh limited to breakdown of skin: Secondary | ICD-10-CM | POA: Diagnosis not present

## 2023-10-07 DIAGNOSIS — R531 Weakness: Secondary | ICD-10-CM | POA: Diagnosis not present

## 2023-10-08 ENCOUNTER — Telehealth: Payer: Self-pay

## 2023-10-08 NOTE — Telephone Encounter (Signed)
 [employer] faxed document  Request for independent assessment for personal care services attestation of medical need , to be filled out by provider. Patient requested to send it back via Fax within 5-days. Document is located in providers tray at front office.Please advise at  [phone].

## 2023-10-09 ENCOUNTER — Ambulatory Visit: Payer: No Typology Code available for payment source | Admitting: Family

## 2023-10-11 DIAGNOSIS — S72001D Fracture of unspecified part of neck of right femur, subsequent encounter for closed fracture with routine healing: Secondary | ICD-10-CM | POA: Diagnosis not present

## 2023-10-11 DIAGNOSIS — M869 Osteomyelitis, unspecified: Secondary | ICD-10-CM | POA: Diagnosis not present

## 2023-10-11 DIAGNOSIS — M6281 Muscle weakness (generalized): Secondary | ICD-10-CM | POA: Diagnosis not present

## 2023-10-16 ENCOUNTER — Telehealth: Payer: Self-pay

## 2023-10-16 NOTE — Progress Notes (Signed)
..   Medicaid Managed Care   Unsuccessful Outreach Note  10/16/2023 Name: Scott Pruitt MRN: [id number] DOB: 1961/09/20  Referred by: Pcp, No Reason for referral : High Risk Managed Medicaid (I called the patient's caregiver today to get them schedule with the MM BSW. She did not answer but I left a message on her VM with my name and call back number.)   An unsuccessful telephone outreach was attempted today. The patient was referred to the case management team for assistance with care management and care coordination.   Follow Up Plan: A HIPAA compliant phone message was left for the patient providing contact information and requesting a return call.  The care management team will reach out to the patient again over the next 7 days.   Weston Settle [facility], [facility] Guide Direct Dial: [phone]  Fax: [phone]

## 2023-10-17 ENCOUNTER — Ambulatory Visit: Payer: Medicaid Other | Admitting: Family

## 2023-10-18 ENCOUNTER — Telehealth: Payer: Self-pay

## 2023-10-18 NOTE — Telephone Encounter (Signed)
ERROR
